# Patient Record
Sex: Male | Born: 1980
Health system: Southern US, Community
[De-identification: ages and names within clinical notes are randomized; demographics above are authoritative.]

## PROBLEM LIST (undated history)

## (undated) DIAGNOSIS — G4733 Obstructive sleep apnea (adult) (pediatric): Secondary | ICD-10-CM

## (undated) DIAGNOSIS — T7840XA Allergy, unspecified, initial encounter: Secondary | ICD-10-CM

## (undated) DIAGNOSIS — K219 Gastro-esophageal reflux disease without esophagitis: Secondary | ICD-10-CM

## (undated) DIAGNOSIS — E785 Hyperlipidemia, unspecified: Secondary | ICD-10-CM

## (undated) DIAGNOSIS — G4730 Sleep apnea, unspecified: Secondary | ICD-10-CM

## (undated) DIAGNOSIS — J189 Pneumonia, unspecified organism: Secondary | ICD-10-CM

## (undated) DIAGNOSIS — F419 Anxiety disorder, unspecified: Secondary | ICD-10-CM

## (undated) HISTORY — PX: INGUINAL HERNIA REPAIR: SUR1180

## (undated) HISTORY — DX: Pneumonia, unspecified organism: J18.9

## (undated) HISTORY — DX: Sleep apnea, unspecified: G47.30

## (undated) HISTORY — DX: Allergy, unspecified, initial encounter: T78.40XA

## (undated) HISTORY — PX: APPENDECTOMY: SHX54

## (undated) HISTORY — DX: Gastro-esophageal reflux disease without esophagitis: K21.9

## (undated) HISTORY — DX: Obstructive sleep apnea (adult) (pediatric): G47.33

## (undated) HISTORY — PX: COLONOSCOPY: SHX174

---

## 2002-04-28 ENCOUNTER — Emergency Department (HOSPITAL_COMMUNITY): Admission: EM | Admit: 2002-04-28 | Discharge: 2002-04-28 | Payer: Self-pay | Admitting: Emergency Medicine

## 2006-01-22 ENCOUNTER — Emergency Department (HOSPITAL_COMMUNITY): Admission: EM | Admit: 2006-01-22 | Discharge: 2006-01-23 | Payer: Self-pay | Admitting: Emergency Medicine

## 2006-02-11 ENCOUNTER — Emergency Department (HOSPITAL_COMMUNITY): Admission: EM | Admit: 2006-02-11 | Discharge: 2006-02-12 | Payer: Self-pay | Admitting: Emergency Medicine

## 2009-06-06 ENCOUNTER — Ambulatory Visit: Payer: Self-pay | Admitting: Family

## 2009-06-06 DIAGNOSIS — F411 Generalized anxiety disorder: Secondary | ICD-10-CM

## 2009-06-06 DIAGNOSIS — F419 Anxiety disorder, unspecified: Secondary | ICD-10-CM | POA: Insufficient documentation

## 2009-06-06 DIAGNOSIS — J309 Allergic rhinitis, unspecified: Secondary | ICD-10-CM | POA: Insufficient documentation

## 2009-06-06 DIAGNOSIS — K219 Gastro-esophageal reflux disease without esophagitis: Secondary | ICD-10-CM

## 2009-06-16 ENCOUNTER — Ambulatory Visit: Payer: Self-pay | Admitting: Family

## 2009-06-17 ENCOUNTER — Encounter: Payer: Self-pay | Admitting: Family

## 2009-06-17 ENCOUNTER — Telehealth: Payer: Self-pay | Admitting: Family

## 2009-06-17 LAB — CONVERTED CEMR LAB
BUN: 14 mg/dL (ref 6–23)
CO2: 23 meq/L (ref 19–32)
Chloride: 101 meq/L (ref 96–112)
Eosinophils Relative: 6 % — ABNORMAL HIGH (ref 0–5)
Glucose, Bld: 85 mg/dL (ref 70–99)
HCT: 45.7 % (ref 39.0–52.0)
Hemoglobin: 15.8 g/dL (ref 13.0–17.0)
Indirect Bilirubin: 0.5 mg/dL (ref 0.0–0.9)
LDL Cholesterol: 190 mg/dL — ABNORMAL HIGH (ref 0–99)
Lymphocytes Relative: 43 % (ref 12–46)
Lymphs Abs: 3.4 10*3/uL (ref 0.7–4.0)
Monocytes Absolute: 0.5 10*3/uL (ref 0.1–1.0)
Monocytes Relative: 6 % (ref 3–12)
Potassium: 4.5 meq/L (ref 3.5–5.3)
RBC: 5.12 M/uL (ref 4.22–5.81)
Total Bilirubin: 0.6 mg/dL (ref 0.3–1.2)
Total Protein: 7.6 g/dL (ref 6.0–8.3)
Triglycerides: 241 mg/dL — ABNORMAL HIGH (ref <150)
VLDL: 48 mg/dL — ABNORMAL HIGH (ref 0–40)
WBC: 8 10*3/uL (ref 4.0–10.5)

## 2009-06-18 ENCOUNTER — Ambulatory Visit: Payer: Self-pay | Admitting: Family

## 2009-06-18 DIAGNOSIS — R030 Elevated blood-pressure reading, without diagnosis of hypertension: Secondary | ICD-10-CM | POA: Insufficient documentation

## 2009-06-18 DIAGNOSIS — E782 Mixed hyperlipidemia: Secondary | ICD-10-CM | POA: Insufficient documentation

## 2009-06-18 DIAGNOSIS — E785 Hyperlipidemia, unspecified: Secondary | ICD-10-CM | POA: Insufficient documentation

## 2009-07-18 ENCOUNTER — Ambulatory Visit: Payer: Self-pay | Admitting: Family

## 2009-07-18 DIAGNOSIS — E663 Overweight: Secondary | ICD-10-CM | POA: Insufficient documentation

## 2009-08-25 ENCOUNTER — Telehealth: Payer: Self-pay | Admitting: Family

## 2009-09-05 ENCOUNTER — Telehealth (INDEPENDENT_AMBULATORY_CARE_PROVIDER_SITE_OTHER): Payer: Self-pay | Admitting: *Deleted

## 2009-09-22 ENCOUNTER — Ambulatory Visit: Payer: Self-pay | Admitting: Family

## 2009-09-22 LAB — CONVERTED CEMR LAB
Inflenza A Ag: NEGATIVE
Rapid Strep: NEGATIVE

## 2009-12-01 ENCOUNTER — Ambulatory Visit: Payer: Self-pay | Admitting: Family

## 2009-12-01 LAB — CONVERTED CEMR LAB
LDL Cholesterol: 199 mg/dL — ABNORMAL HIGH (ref 0–99)
Triglycerides: 226 mg/dL — ABNORMAL HIGH (ref <150)

## 2009-12-02 ENCOUNTER — Telehealth: Payer: Self-pay | Admitting: Family

## 2009-12-29 ENCOUNTER — Ambulatory Visit: Payer: Self-pay | Admitting: Family

## 2010-01-09 ENCOUNTER — Telehealth: Payer: Self-pay | Admitting: Family

## 2010-02-12 ENCOUNTER — Telehealth: Payer: Self-pay | Admitting: Family

## 2010-02-23 ENCOUNTER — Encounter: Payer: Self-pay | Admitting: Family

## 2010-03-16 ENCOUNTER — Ambulatory Visit: Payer: Self-pay | Admitting: Family

## 2010-03-16 LAB — CONVERTED CEMR LAB
ALT: 44 units/L (ref 0–53)
AST: 20 units/L (ref 0–37)
Albumin: 4.6 g/dL (ref 3.5–5.2)
Cholesterol: 232 mg/dL — ABNORMAL HIGH (ref 0–200)
HDL: 37 mg/dL — ABNORMAL LOW (ref >39)
Total Bilirubin: 0.9 mg/dL (ref 0.3–1.2)
Total CHOL/HDL Ratio: 6.3

## 2010-03-17 ENCOUNTER — Telehealth: Payer: Self-pay | Admitting: Family

## 2010-07-20 ENCOUNTER — Ambulatory Visit: Payer: Self-pay | Admitting: Family

## 2010-07-20 LAB — CONVERTED CEMR LAB
ALT: 43 units/L (ref 0–53)
AST: 24 units/L (ref 0–37)
Alkaline Phosphatase: 79 units/L (ref 39–117)
Bilirubin, Direct: 0.1 mg/dL (ref 0.0–0.3)
Cholesterol: 213 mg/dL — ABNORMAL HIGH (ref 0–200)
Indirect Bilirubin: 0.7 mg/dL (ref 0.0–0.9)

## 2010-07-21 ENCOUNTER — Telehealth: Payer: Self-pay | Admitting: Family

## 2010-08-05 ENCOUNTER — Ambulatory Visit: Payer: Self-pay | Admitting: Family

## 2010-08-05 ENCOUNTER — Encounter: Payer: Self-pay | Admitting: Internal Medicine

## 2010-08-05 ENCOUNTER — Encounter: Payer: Self-pay | Admitting: Family

## 2010-08-05 DIAGNOSIS — J069 Acute upper respiratory infection, unspecified: Secondary | ICD-10-CM | POA: Insufficient documentation

## 2010-09-15 NOTE — Assessment & Plan Note (Signed)
Summary: 1 month follow up / tf,cma--Rm 5   Vital Signs:  Patient profile:   30 year old male Height:      71 inches Weight:      225 pounds BMI:     31.49 Temp:     98.2 degrees F oral Pulse rate:   72 / minute Pulse rhythm:   regular Resp:     16 per minute BP sitting:   112 / 80  (right arm) Cuff size:   large  Vitals Entered By: Mervin Kung CMA Duncan Dull) (March 16, 2010 8:35 AM) CC: Room 5   1 month follow up.  Needs refill on PAxil. Is Patient Diabetic? No Pain Assessment Patient in pain? no        CC:  Room 5   1 month follow up.  Needs refill on PAxil.Marland Kitchen  History of Present Illness: Brandon Jensen is a 30 year old male who presents today for follow up.  1) Hyperlipidemia- Patient was intolerant to simvastatin due to myalgias, however he reports that he is feeling well on pravastatin. No myalgias.     2)Inguinal hernia- Notes that he had hernia surgery- about 6 weeks ago.  Feeling good- no longer having inguinal pain as prior to his surgery.  Notes that he has gained a few pounds back following his surgery.  3)Anxiety- reports that this is well controlled on Paxil.  No complaints.    4)HTN- has bee watching his diet.     Allergies: 1)  ! * Red Dye  Past History:  Past Medical History: Last updated: 06/06/2009 Allergic rhinitis GERD  Past Surgical History: Last updated: 06/06/2009 Denies surgical history  Family History: Last updated: 06/06/2009 Family History Hypertension (grandfather, paternal) Family History of Prostate CA 1st degree relative <50 (maternal GF)  Social History: Last updated: 06/06/2009 Occupation: English as a second language teacher Married 5 years 2 daughters 4,2   Risk Factors: Alcohol Use: 12 per month (06/06/2009) Caffeine Use: 4-5 beverages daily (06/06/2009) Exercise: no (06/06/2009)  Risk Factors: Smoking Status: quit (06/06/2009) Packs/Day: 1.0 (06/06/2009)  Review of Systems       see HPI  Physical Exam  General:   Well-developed,well-nourished,in no acute distress; alert,appropriate and cooperative throughout examination Lungs:  Normal respiratory effort, chest expands symmetrically. Lungs are clear to auscultation, no crackles or wheezes. Heart:  Normal rate and regular rhythm. S1 and S2 normal without gallop, murmur, click, rub or other extra sounds. Psych:  Cognition and judgment appear intact. Alert and cooperative with normal attention span and concentration. No apparent delusions, illusions, hallucinations   Impression & Recommendations:  Problem # 1:  MIXED HYPERLIPIDEMIA (ICD-272.2) Assessment Comment Only Will check LFT's and follow up FLP His updated medication list for this problem includes:    Pravachol 20 Mg Tabs (Pravastatin sodium) ..... One tablet by mouth daily at bedtime  Orders: T-Lipid Profile [phone]) T-Hepatic Function [phone])  Problem # 2:  ELEVATED BP READING WITHOUT DX HYPERTENSION (ICD-796.2) Assessment: Improved BP is stable, continue diet monitoring.  BP today: 112/80 Prior BP: 118/86 (12/29/2009)  Labs Reviewed: Creat: 1.15 (06/17/2009) Chol: 277 (12/01/2009)   HDL: 33 (12/01/2009)   LDL: 199 (12/01/2009)   TG: 226 (12/01/2009)  Instructed in low sodium diet (DASH Handout) and behavior modification.    Problem # 3:  ANXIETY (ICD-300.00) Assessment: Improved Stable, continue paxil. His updated medication list for this problem includes:    Paxil 40 Mg Tabs (Paroxetine hcl) .Marland Kitchen... Take 1 tablet by mouth once a day  Complete  Medication List: 1)  Paxil 40 Mg Tabs (Paroxetine hcl) .... Take 1 tablet by mouth once a day 2)  Pravachol 20 Mg Tabs (Pravastatin sodium) .... One tablet by mouth daily at bedtime  Patient Instructions: 1)  Please follow up in 4 months- Sooner if problems or concerns. Prescriptions: PRAVACHOL 20 MG TABS (PRAVASTATIN SODIUM) one tablet by mouth daily at bedtime  #30 x 3   Entered and Authorized by:   Lemont Fillers FNP    Signed by:   Lemont Fillers FNP on 03/16/2010   Method used:   [facility] to        [facility] [phone]* (retail)       [address]       [place], [place]  [address]       Ph: [phone]       Fax: [phone]   RxID:   [id number] PAXIL 40 MG TABS (PAROXETINE HCL) Take 1 tablet by mouth once a day  #30 Tablet x 3   Entered and Authorized by:   Lemont Fillers FNP   Signed by:   Lemont Fillers FNP on 03/16/2010   Method used:   [facility]        [facility] [phone]* (retail)       [address]       [place], [place]  [address]       Ph: [phone]       Fax: [phone]   RxID:   [id number]   Current Allergies (reviewed today): ! * RED DYE

## 2010-09-15 NOTE — Progress Notes (Signed)
Summary: start pravastatin  Phone Note Outgoing Call   Call placed by: Lemont Fillers FNP,  February 12, 2010 9:20 AM Call placed to: Patient Summary of Call: Pls call patient and see how he is feeling off of the simvastatin.  If he is feeling better, I would like him to try a different medicine for his high cholesterol- pravastatin.  Fewer people have problems with muscle pain on this medication.  He should follow up 1 month after starting medication. Initial call taken by: Lemont Fillers FNP,  February 12, 2010 9:22 AM  Follow-up for Phone Call        Pt advised of Sevanna Ballengee's instructions and is agreeable. F/u scheduled for 03/16/10 @ 8:15 am. Pt will be fasting.  Appt reminder mailed to pt.  Nicki Guadalajara Fergerson CMA  February 12, 2010 10:09 AM     New/Updated Medications: PRAVACHOL 20 MG TABS (PRAVASTATIN SODIUM) one tablet by mouth daily at bedtime Prescriptions: PRAVACHOL 20 MG TABS (PRAVASTATIN SODIUM) one tablet by mouth daily at bedtime  #30 x 1   Entered and Authorized by:   Lemont Fillers FNP   Signed by:   Lemont Fillers FNP on 02/12/2010   Method used:   Electronically to        [facility] [phone]* (retail)       [address]       [place], [place]  [address]       Ph: [phone]       Fax: [phone]   RxID:   [id number]

## 2010-09-15 NOTE — Medication Information (Signed)
Summary: Nonadherence with Pravastatin Sodium/[employer]  Nonadherence with Pravastatin Sodium/[employer]   Imported By: Lanelle Bal 03/27/2010 13:44:34  _____________________________________________________________________  External Attachment:    Type:   Image     Comment:   External Document

## 2010-09-15 NOTE — Progress Notes (Signed)
Summary: cholesterol result  Phone Note Outgoing Call   Summary of Call: Pls call pt and let him know that his cholesterol is still very high.  Total cholesterol  277 (should be under 200),  LDL is 199- should be under 130 for him, triglycerides also elevated.  At this point I think that he would benefit from starting a statin to decrease his risk for heart disease.  Patient should still plan to follow up in 1 month.   Initial call taken by: Lemont Fillers FNP,  December 02, 2009 10:25 AM  Follow-up for Phone Call        Left message on machine to return call.  Mervin Kung CMA  December 02, 2009 10:45 AM   Left message on pt's cell voicemail advising pt. of results and instructions per Layton Tappan O'Sullivan,NP.  Ok to leave results on voicemail per pt.  Advised pt. to call the office to reschedule his 1 month follow up as it was cancelled.  Nicki Guadalajara Fergerson CMA  December 02, 2009 1:08 PM     New/Updated Medications: SIMVASTATIN 20 MG TABS (SIMVASTATIN) one tablet by mouth daily at bedtime.  Prescriptions: SIMVASTATIN 20 MG TABS (SIMVASTATIN) one tablet by mouth daily at bedtime.  #30 x 1   Entered and Authorized by:   Lemont Fillers FNP   Signed by:   Lemont Fillers FNP on 12/02/2009   Method used:   Electronically to        [facility] [phone]* (retail)       [address]       [place], [place]  [address]       Ph: [phone]       Fax: [phone]   RxID:   [id number]

## 2010-09-15 NOTE — Progress Notes (Signed)
Summary: Blood Pressure Readings  Phone Note Call from Patient Call back at Home Phone [phone]   Caller: Patient Summary of Call: patient called and left voice message to report his Blood pressure readings.  Call was returned to patient he states his blood pressure readings were: 1/13  131/81    1/15 130/70  1/16 144/82     1/17 123/76 1/18 133/84     1/20 125/79  1/21 116/76 Initial call taken by: Glendell Docker CMA,  September 05, 2009 5:47 PM  Follow-up for Phone Call        Please call patient and let him know that I would like him to continue working on low sodium diet and exercise/weight loss.  He should continue to monitor his BP and call us if his blood pressure is greater than 150/90.  We will hold off on medicines for now.   Follow-up by: Lemont Fillers FNP,  September 07, 2009 8:52 AM  Additional Follow-up for Phone Call Additional follow up Details #1::        Spoke with pt informed him of [name] recommendations, pt agreed .Kandice Hams  September 08, 2009 11:48 AM  Additional Follow-up by: Kandice Hams,  September 08, 2009 11:48 AM

## 2010-09-15 NOTE — Progress Notes (Signed)
  Phone Note Outgoing Call   Summary of Call: Called patient to review BP's- patient notes that he received a BP monitor for christmas but has not been checking.  He was instructed to check BP daily for 1 week and call with results for further instructions Initial call taken by: Lemont Fillers FNP,  August 25, 2009 1:25 PM

## 2010-09-15 NOTE — Progress Notes (Signed)
Summary: lab results  Phone Note Outgoing Call   Summary of Call: Please call patient and let him know that his triglycerides are still high.  I would like for him to add fish oil, and avoid concentrated sweets, white fluffy carbs.  Should also work on diet, exercise and weight loss.  Continue Pravachol. Come fasting to his appointment in June. Initial call taken by: Lemont Fillers FNP,  July 21, 2010 9:09 AM  Follow-up for Phone Call        Pt notified and voices understanding. Nicki Guadalajara Fergerson CMA Duncan Dull)  July 21, 2010 10:39 AM     New/Updated Medications: FISH OIL 1000 MG CAPS (OMEGA-3 FATTY ACIDS) 2 caps by mouth two times a day

## 2010-09-15 NOTE — Progress Notes (Signed)
Summary: Simvastatin side effect?  Phone Note Call from Patient Call back at [phone] cell #--can leave message on machine   Caller: Patient Call For: Lemont Fillers FNP Summary of Call: Pt states he has been having muscle cramps in his forearms since taking the Simvastatin.  Advised pt. to stop med until further notice.  Please advise.  Mervin Kung CMA  Jan 09, 2010 10:34 AM   Follow-up for Phone Call        Late entry:  called patient on 5/28 to follow up on Myalgias.  He told me that his myalgias were better following discontinuation of simvastin.  Recommended that he stay off for now.  We can consider a different statin in a few weeks. Follow-up by: Lemont Fillers FNP,  Jan 13, 2010 8:10 AM

## 2010-09-15 NOTE — Assessment & Plan Note (Signed)
Summary: congestion/Fever/hea   Vital Signs:  Patient profile:   30 year old male Weight:      223 pounds Temp:     99.0 degrees F oral BP sitting:   130 / 80  (left arm)  Vitals Entered By: Brandon Jensen (September 22, 2009 2:37 PM) CC: fatigue and fever up to 101 x2-3 days chest congestion    CC:  fatigue and fever up to 101 x2-3 days chest congestion .  History of Present Illness: Brandon Jensen is a 30 year old male who presents today with c/o fever up to 101 yesterday,  notes + fatigue, denies sore throat, + hoarseness, denies N/V/D. Symptoms started 2-3 days ago. Notes that his daughter recently had strep, the other daughter with viral illness/cough, vomitting.  Notes that he has taken ibuprofen for fever.  Notes + nasal discharge is green/yellow in nature. Denies cough.   Allergies: 1)  ! * Red Dye  Physical Exam  General:  Well-developed,well-nourished,in no acute distress; alert,appropriate and cooperative throughout examination Head:  Normocephalic and atraumatic without obvious abnormalities. No apparent alopecia or balding. Ears:  External ear exam shows no significant lesions or deformities.  Otoscopic examination reveals clear canals, tympanic membranes are intact bilaterally without bulging, retraction, inflammation or discharge. Hearing is grossly normal bilaterally. Lungs:  Normal respiratory effort, chest expands symmetrically. Lungs are clear to auscultation, no crackles or wheezes. Heart:  Normal rate and regular rhythm. S1 and S2 normal without gallop, murmur, click, rub or other extra sounds. Cervical Nodes:  No lymphadenopathy noted   Impression & Recommendations:  Problem # 1:  SINUSITIS (ICD-473.9) Rapid strep negative, influenza negative.  I suspect acute viral illness initially, however patient is now developing copious green/yellow discharge.  Will treat with amoxicillin for sinusitus. His updated medication list for this problem includes:    Amoxicillin  500 Mg Cap (Amoxicillin) .Marland Kitchen... Take 1 capsule by mouth three times a day x 10 days  Orders: Rapid Strep ([address]) Flu A+B ([address])  Complete Medication List: 1)  Paxil 40 Mg Tabs (Paroxetine hcl) .... Take 1 tablet by mouth once a day 2)  Amoxicillin 500 Mg Cap (Amoxicillin) .... Take 1 capsule by mouth three times a day x 10 days  Patient Instructions: 1)  Drink plenty of fluids, get lots of rest.  2)  Take 400-600mg  of Ibuprofen (Advil, Motrin) with food every 4-6 hours as needed for relief of pain or comfort of fever. 3)  Call if your symptoms are not resolved in 1 week. Prescriptions: AMOXICILLIN 500 MG CAP (AMOXICILLIN) Take 1 capsule by mouth three times a day X 10 days  #30 x 0   Entered and Authorized by:   Lemont Fillers FNP   Signed by:   Lemont Fillers FNP on 09/22/2009   Method used:   Electronically to        [facility] [phone]* (retail)       [address]       [place], [place]  [address]       Ph: [phone]       Fax: [phone]   RxID:   [id number]   Laboratory Results    Other Tests  Rapid Strep: negative Influenza A: negative Influenza B: negative  Kit Test Internal QC: Positive   (Normal Range: Negative)

## 2010-09-15 NOTE — Progress Notes (Signed)
Summary: lab results  Phone Note Outgoing Call   Summary of Call: Please call patient and let him know that his cholesterol is improving, but not quite at goal.  Triglycerides are high.  He should try to avoid concentrated sweets (sodas, juice candy etc), white/fluffy carbs (potatoes/pasta/white bread) and instead eat smaller portions of whole grains (whole grain bread/pasta etc.)  I would like to have him increase his pravastatin to 40mg  (he can take 2 tabs of the 20mg  pills until they run out) I will send a new rx for 40 to his pharmacy.   Patient to return in 3 months (fasting) please. Initial call taken by: Lemont Fillers FNP,  March 17, 2010 9:13 AM  Follow-up for Phone Call        Pt advised per [facility] instructions and voices understanding.  Pt has follow up in December and will be fasting.  Nicki Guadalajara Fergerson CMA (AAMA)  March 17, 2010 1:27 PM     New/Updated Medications: PRAVACHOL 40 MG TABS (PRAVASTATIN SODIUM) one tablet by mouth daily Prescriptions: PRAVACHOL 40 MG TABS (PRAVASTATIN SODIUM) one tablet by mouth daily  #30 x 2   Entered and Authorized by:   Lemont Fillers FNP   Signed by:   Lemont Fillers FNP on 03/17/2010   Method used:   Electronically to        [facility] [phone]* (retail)       [address]       [place], [place]  [address]       Ph: [phone]       Fax: [phone]   RxID:   [id number]

## 2010-09-15 NOTE — Assessment & Plan Note (Signed)
Summary: 1 month follow up/mhf   Vital Signs:  Patient profile:   30 year old male Height:      71 inches Weight:      221 pounds BMI:     30.93 Temp:     98.1 degrees F oral Pulse rate:   66 / minute Pulse rhythm:   regular Resp:     16 per minute BP sitting:   118 / 86  (right arm) Cuff size:   regular  Vitals Entered By: Mervin Kung CMA (Dec 29, 2009 8:04 AM) CC: room 4  1 month f/u.  Pt has not started HCTZ and Simvastatin yet; wants to discuss. Is Patient Diabetic? No   CC:  room 4  1 month f/u.  Pt has not started HCTZ and Simvastatin yet; wants to discuss.Marland Kitchen  History of Present Illness: Brandon Jensen is a 30 year old male who presents today for follow up.  1) HTN-  last visit he was noted to have elevated blood pressure 140/90 and was given rx for HCTZ.  He has not started medication yet as he was watching his blood pressure at home and notes that his BP has been averaging 130's/77.  2)Hyperlipidemia- Has not started simvastin.  Has questions regarding initiation/side effects of this medication.    3) Inguinal Hernia- Saw Dr. Bertram Savin.  Noted that she was unable to palpate hernia, but clinically believes that patient has hernia based on symptoms.  She rx'd light duty at work for 1 month.  Pt notes that this has not improved his symptoms and in fact they are becoming worse.  Has intermittent pain shooting into his right groin.  He is likely going to to opt for laparoscopic RIH repair with Dr. Freida Busman.    Allergies: 1)  ! * Red Dye  Physical Exam  General:  Well-developed,well-nourished,in no acute distress; alert,appropriate and cooperative throughout examination Lungs:  Normal respiratory effort, chest expands symmetrically. Lungs are clear to auscultation, no crackles or wheezes. Heart:  Normal rate and regular rhythm. S1 and S2 normal without gallop, murmur, click, rub or other extra sounds.   Impression & Recommendations:  Problem # 1:  ELEVATED BP READING  WITHOUT DX HYPERTENSION (ICD-796.2) Assessment Improved BP looks better today, DBP readings at home have been lower (70's).  Reasonable to hold off on initiation of HCTZ.  Pt to monitor BP at home.   The following medications were removed from the medication list:    Hydrochlorothiazide 25 Mg Tabs (Hydrochlorothiazide) ..... One tab by mouth daily  BP today: 118/86 Prior BP: 140/90 (12/01/2009)  Labs Reviewed: Creat: 1.15 (06/17/2009) Chol: 277 (12/01/2009)   HDL: 33 (12/01/2009)   LDL: 199 (12/01/2009)   TG: 226 (12/01/2009)  Instructed in low sodium diet (DASH Handout) and behavior modification.    Problem # 2:  MIXED HYPERLIPIDEMIA (ICD-272.2) Assessment: Unchanged LDL 199, total cholesterol 277.  Numbers are essentially unchanged over last 6 months despite dietary modifications and 12 pounds of weight loss.  Discussed common side effects of simvastatin with patient including mylagias (pt instructed to d/c and call if myalgias occur).  He is agreeable to initiate therapy.  F/u in 1 month for FLP and LFT's.  Pt counselled on diet excercise and continued weight loss.   His updated medication list for this problem includes:    Simvastatin 20 Mg Tabs (Simvastatin) ..... One tablet by mouth daily at bedtime.  Future Orders: T-Lipid Profile [id number] ... 01/29/2010 T-Hepatic Function [phone]) ... 01/29/2010  Labs Reviewed: SGOT: 24 (06/17/2009)   SGPT: 47 (06/17/2009)   HDL:33 (12/01/2009), 34 (06/17/2009)  LDL:199 (12/01/2009), 190 (06/17/2009)  Chol:277 (12/01/2009), 272 (06/17/2009)  Trig:226 (12/01/2009), 241 (06/17/2009)  Problem # 3:  INGUINAL HERNIA, RIGHT (ICD-550.90) Assessment: New F/u with surgery for repair.  Complete Medication List: 1)  Paxil 40 Mg Tabs (Paroxetine hcl) .... Take 1 tablet by mouth once a day 2)  Simvastatin 20 Mg Tabs (Simvastatin) .... One tablet by mouth daily at bedtime.  Patient Instructions: 1)  Please return in 1 month fasting for a lab  visit. 2)  Follow up in 3 months for an appointment.  We will repeat your blood pressure at that time and see how you are doing with your diet and weight loss.    Current Allergies (reviewed today): ! * RED DYE

## 2010-09-15 NOTE — Assessment & Plan Note (Signed)
Summary: 1 MONTH FOLLOW UP/MHF--Rm 5   Vital Signs:  Patient profile:   30 year old male Height:      71 inches Weight:      232 pounds BMI:     32.47 Temp:     97.9 degrees F oral Pulse rate:   78 / minute Pulse rhythm:   regular Resp:     16 per minute BP sitting:   126 / 88  (right arm) Cuff size:   large  Vitals Entered By: Mervin Kung CMA Duncan Dull) (July 20, 2010 8:10 AM) CC: Pt here for 1 month f/u, Hypertension Management Is Patient Diabetic? No Pain Assessment Patient in pain? no      Comments Pt agrees all med doses and directions are correct. Nicki Guadalajara Fergerson CMA Duncan Dull)  July 20, 2010 8:14 AM    Primary Care Provider:  Lemont Fillers FNP  CC:  Pt here for 1 month f/u and Hypertension Management.  History of Present Illness: Brandon Jensen is a 30 year old male who presents today for routine follow up.  1) HTN-Denies, chest pain, swelling or headache.  2) Lipids- Cut soda out completely.  Denies myalgia  3) Anxiety- reports that this is well controlled on paxil.   Hypertension History:      He denies headache, chest pain, and peripheral edema.        Positive major cardiovascular risk factors include hyperlipidemia.  Negative major cardiovascular risk factors include male age less than 44 years old and non-tobacco-user status.     Allergies: 1)  ! * Red Dye  Past History:  Past Medical History: Last updated: 06/06/2009 Allergic rhinitis GERD  Past Surgical History: Last updated: 06/06/2009 Denies surgical history  Physical Exam  General:  Well-developed,well-nourished,in no acute distress; alert,appropriate and cooperative throughout examination Head:  Normocephalic and atraumatic without obvious abnormalities. No apparent alopecia or balding. Lungs:  Normal respiratory effort, chest expands symmetrically. Lungs are clear to auscultation, no crackles or wheezes. Heart:  Normal rate and regular rhythm. S1 and S2 normal without  gallop, murmur, click, rub or other extra sounds. Extremities:  No clubbing, cyanosis, edema, or deformity noted with normal full range of motion of all joints.     Impression & Recommendations:  Problem # 1:  ELEVATED BP READING WITHOUT DX HYPERTENSION (ICD-796.2) Assessment Unchanged BP stable, not currently on meds.  Contiue to monitor.  BP today: 126/88 Prior BP: 112/80 (03/16/2010)  10 Yr Risk Heart Disease: 4 %  Labs Reviewed: Creat: 1.15 (06/17/2009) Chol: 232 (03/16/2010)   HDL: 37 (03/16/2010)   LDL: 135 (03/16/2010)   TG: 299 (03/16/2010)  Instructed in low sodium diet (DASH Handout) and behavior modification.    Problem # 2:  ANXIETY (ICD-300.00) Assessment: Unchanged Anxiety is stable, continue paxil. His updated medication list for this problem includes:    Paxil 40 Mg Tabs (Paroxetine hcl) .Marland Kitchen... Take 1 tablet by mouth once a day  Problem # 3:  MIXED HYPERLIPIDEMIA (ICD-272.2) Assessment: Comment Only Last visit pravastatin was increased from 20mg  to 40mg .  Check FLP and LFT's today. His updated medication list for this problem includes:    Pravachol 40 Mg Tabs (Pravastatin sodium) ..... One tablet by mouth daily  Orders: TLB-Lipid Panel (80061-LIPID) TLB-Hepatic/Liver Function Pnl (80076-HEPATIC)  Complete Medication List: 1)  Paxil 40 Mg Tabs (Paroxetine hcl) .... Take 1 tablet by mouth once a day 2)  Pravachol 40 Mg Tabs (Pravastatin sodium) .... One tablet by mouth daily  Hypertension Assessment/Plan:      The patient's hypertensive risk group is category B: At least one risk factor (excluding diabetes) with no target organ damage.  His calculated 10 year risk of coronary heart disease is 4 %.  Today's blood pressure is 126/88.  His blood pressure goal is < 140/90.  Patient Instructions: 1)  Please work hard on diet, exercise and weight loss. 2)  Complete lab work on the first floor. 3)  Follow up in 6 months. Prescriptions: PAXIL 40 MG TABS  (PAROXETINE HCL) Take 1 tablet by mouth once a day  #30 Tablet x 5   Entered and Authorized by:   Lemont Fillers FNP   Signed by:   Lemont Fillers FNP on 07/20/2010   Method used:   Electronically to        [facility] [phone]* (retail)       [address]       [place], [place]  [address]       Ph: [phone]       Fax: [phone]   RxID:   [id number]    Orders Added: 1)  TLB-Lipid Panel [80061-LIPID] 2)  TLB-Hepatic/Liver Function Pnl [80076-HEPATIC] 3)  Est. Patient Level III [[address]]    Current Allergies (reviewed today): ! * RED DYE

## 2010-09-15 NOTE — Assessment & Plan Note (Signed)
Summary: 6 month follow up/mhf   Vital Signs:  Patient profile:   30 year old male Height:      71 inches Weight:      221.01 pounds BMI:     30.94 Temp:     98.2 degrees F oral Pulse rate:   72 / minute Pulse rhythm:   regular Resp:     16 per minute BP sitting:   140 / 90  (right arm) Cuff size:   regular  Vitals Entered By: Brandon Jensen CMA (December 01, 2009 8:07 AM) CC: room 4  Six month follow up.   Pt states he has been having an intermittent throbbing pain in his lower abdomen x 1 month. Is Patient Diabetic? No   CC:  room 4  Six month follow up.   Pt states he has been having an intermittent throbbing pain in his lower abdomen x 1 month..  History of Present Illness: Brandon Jensen is a 30 year old male who presents today for follow up.  He notes that he has had intermittent left lower abdominal pain x 1 month. 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 Pain is worse with lifting,  walking.  Pain is better when he pushes on his left lower abdomen.  He has not tried any OTC meds.    BP-  notes that his readings at home 125-130/90.  Today is higher which patient attributes to being nervous about his abdominal pain.    Allergies: 1)  ! * Red Dye  Physical Exam  General:  Well-developed,well-nourished,in no acute distress; alert,appropriate and cooperative throughout examination Genitalia:  GU exam performed with chaperone-circumcised male, no penile lesions.  Exam was discontinued for evaluation of hernia as patient developed erection during exam.  Psych:  Mildly nervous male in NAD.     Impression & Recommendations:  Problem # 1:  ELEVATED BP READING WITHOUT DX HYPERTENSION (ICD-796.2) Assessment Deteriorated Will add HCTZ.  Plan to repeat BP and have pt return in 1 month for BMET and BP check.   BP today: 140/90 Prior BP: 130/80 (09/22/2009)  Labs Reviewed: Creat: 1.15 (06/17/2009) Chol: 272 (06/17/2009)   HDL: 34 (06/17/2009)   LDL: 190 (06/17/2009)   TG: 241  (06/17/2009)  Instructed in low sodium diet (DASH Handout) and behavior modification.    His updated medication list for this problem includes:    Hydrochlorothiazide 25 Mg Tabs (Hydrochlorothiazide) ..... One tab by mouth daily  Problem # 2:  MIXED HYPERLIPIDEMIA (ICD-272.2) Assessment: Comment Only  Patient was noted to have high cholesterol in the fall, he opted for diet/exercise.  Will plan to repeat cholesterol today as patient is fasting.    Orders: T-Lipid Profile ([address])  Problem # 3:  ABDOMINAL PAIN, LOWER (ICD-789.09)  history was consistent with [facility] ([facility]), exam was limited.  Will refer to Surgery for further evaluation.    Orders: Surgical Referral (Surgery)  Complete Medication List: 1)  Paxil 40 Mg Tabs (Paroxetine hcl) .... Take 1 tablet by mouth once a day 2)  Hydrochlorothiazide 25 Mg Tabs (Hydrochlorothiazide) .... One tab by mouth daily  Patient Instructions: 1)  Good job with the weight loss, keep up the good work! 2)  Please follow up in 1 month. 3)  Continue with a low sodium diet. 4)  You will be contacted about your referral to surgery. 5)  Go to ER if you develop severe abdominal pain, nausea, vomitting. Prescriptions: HYDROCHLOROTHIAZIDE 25 MG TABS (HYDROCHLOROTHIAZIDE) one tab by mouth daily  #  30 x 1   Entered and Authorized by:   Brandon Fillers FNP   Signed by:   Brandon Fillers FNP on 12/01/2009   Method used:   Electronically to        [facility] [phone]* (retail)       [address]       [place], [place]  [address]       Ph: [phone]       Fax: [phone]   RxID:   [id number]   Current Allergies (reviewed today): ! * RED DYE

## 2010-09-15 NOTE — Letter (Signed)
Summary: Out of Work  Adult nurse at [employer]. [address]   [place], [place] [address]   Phone: [phone]  Fax: [phone]    September 22, 2009   Employee:  DEVANSH RIESE    To Whom It May Concern:   For Medical reasons, please excuse the above named employee from work for the following dates:  Start:   09/22/09  End:   09/24/09  If you need additional information, please feel free to contact our office.         Sincerely,    Lemont Fillers FNP

## 2010-09-17 NOTE — Letter (Signed)
Summary: Out of Work  Adult nurse at [employer]. [address]   [place], [place] [address]   Phone: [phone]  Fax: [phone]      August 05, 2010     Employee:  CORRAN LALONE    To Whom It May Concern:   For Medical reasons, please excuse the above named employee from work for the following dates:  Start:   08/06/2010  End:   08/07/2010  He may resume his normal work schedule on Monday 08/10/2010.If you need additional information, please feel free to contact our office.           Sincerely,    Glendell Docker, CMA (AAMA) [name], FNP [facility]

## 2010-09-17 NOTE — Assessment & Plan Note (Signed)
Summary: HEAD AND CHEST CONGESTION/MHF   Vital Signs:  Patient profile:   30 year old male Height:      71 inches Weight:      233 pounds BMI:     32.61 O2 Sat:      97 % on Room air Temp:     99.3 degrees F oral Pulse rate:   120 / minute Resp:     18 per minute BP sitting:   126 / 80  (right arm) Cuff size:   large  Vitals Entered By: Glendell Docker CMA (August 05, 2010 2:15 PM)  O2 Flow:  Room air CC: Head Congestion Is Patient Diabetic? No Pain Assessment Patient in pain? no      Comments c/o head congestion, nasal congestion and drainage in color  for the past 2 days, hot and cold chills, feverish   Primary Care [place]:  Lemont Fillers FNP  CC:  Head Congestion.  History of Present Illness: Symptoms started 2 days ago. Woke up last night with chills.  Nasal congestion- purulent.  + cough, on occasion with purulent phlegm.  + Fever- subjective at home.  He took ibuprofen a few hours ago. No recent sick contacts.  Denies sore throat or ear pain.  Has been drinking tea.  "probably not drinking enough water."  Did not have a flu shot this year.   Preventive Screening-Counseling & Management  Alcohol-Tobacco     Smoking Status: quit  Allergies: 1)  ! * Red Dye  Physical Exam  General:  Well-developed,well-nourished,in no acute distress; alert,appropriate and cooperative throughout examination Head:  Normocephalic and atraumatic without obvious abnormalities. No apparent alopecia or balding. Eyes:  PERRLA, sclera clear Ears:  External ear exam shows no significant lesions or deformities.  Otoscopic examination reveals clear canals, tympanic membranes are intact bilaterally without bulging, retraction, inflammation or discharge. Hearing is grossly normal bilaterally. Mouth:  Oral mucosa and oropharynx without lesions or exudates.  Teeth in good repair. Neck:  No deformities, masses, or tenderness noted. Lungs:  Normal respiratory effort, chest expands  symmetrically. Lungs are clear to auscultation, no crackles or wheezes. Heart:  Normal rate and regular rhythm. S1 and S2 normal without gallop, murmur, click, rub or other extra sounds.   Impression & Recommendations:  Problem # 1:  URI (ICD-465.9) Assessment New Suspect viral etiology at this point.  Rapid flu negative.  Recommended supportive care, pt to call back if symtpoms worsen, or if no improvment in 48-72 hours.  Complete Medication List: 1)  Paxil 40 Mg Tabs (Paroxetine hcl) .... Take 1 tablet by mouth once a day 2)  Pravachol 40 Mg Tabs (Pravastatin sodium) .... One tablet by mouth daily 3)  Fish Oil 1000 Mg Caps (Omega-3 fatty acids) .... 2 caps by mouth two times a day  Other Orders: Flu A+B (87400) EKG w/ Interpretation (93000)  Patient Instructions: 1)  Drink plenty of fluids. 2)  Take 400-600mg  of Ibuprofen (Advil, Motrin) with food every 4-6 hours as needed for relief of pain or comfort of fever. 3)  Call if symptoms worsen, or if they are not improved in 48-72 hours.   Orders Added: 1)  Flu A+B [87400] 2)  EKG w/ Interpretation [93000] 3)  Est. Patient Level III [04540]     Current Allergies (reviewed today): ! * RED DYE   Laboratory Results    Other Tests  Influenza A: negative Influenza B: negative

## 2010-09-23 ENCOUNTER — Emergency Department (INDEPENDENT_AMBULATORY_CARE_PROVIDER_SITE_OTHER): Payer: Managed Care, Other (non HMO)

## 2010-09-23 ENCOUNTER — Emergency Department (HOSPITAL_BASED_OUTPATIENT_CLINIC_OR_DEPARTMENT_OTHER)
Admission: EM | Admit: 2010-09-23 | Discharge: 2010-09-23 | Disposition: A | Discharge status: Home / Self Care | Payer: Managed Care, Other (non HMO) | Attending: Emergency Medicine | Admitting: Emergency Medicine

## 2010-09-23 DIAGNOSIS — M25519 Pain in unspecified shoulder: Secondary | ICD-10-CM | POA: Insufficient documentation

## 2010-09-23 DIAGNOSIS — R51 Headache: Secondary | ICD-10-CM

## 2010-09-23 DIAGNOSIS — Y92410 Unspecified street and highway as the place of occurrence of the external cause: Secondary | ICD-10-CM | POA: Insufficient documentation

## 2010-09-23 DIAGNOSIS — R079 Chest pain, unspecified: Secondary | ICD-10-CM | POA: Insufficient documentation

## 2010-09-23 DIAGNOSIS — R040 Epistaxis: Secondary | ICD-10-CM

## 2010-12-10 ENCOUNTER — Ambulatory Visit (HOSPITAL_BASED_OUTPATIENT_CLINIC_OR_DEPARTMENT_OTHER): Payer: Managed Care, Other (non HMO) | Attending: Otolaryngology

## 2010-12-10 DIAGNOSIS — G4733 Obstructive sleep apnea (adult) (pediatric): Secondary | ICD-10-CM | POA: Insufficient documentation

## 2010-12-10 DIAGNOSIS — R259 Unspecified abnormal involuntary movements: Secondary | ICD-10-CM | POA: Insufficient documentation

## 2010-12-19 DIAGNOSIS — R259 Unspecified abnormal involuntary movements: Secondary | ICD-10-CM

## 2010-12-19 DIAGNOSIS — G4733 Obstructive sleep apnea (adult) (pediatric): Secondary | ICD-10-CM

## 2010-12-19 NOTE — Procedures (Signed)
NAME:  Brandon Jensen, Brandon Jensen              ACCOUNT NO.:  [id number]  MEDICAL RECORD NO.:  [id number]          PATIENT TYPE:  OUT  LOCATION:  SLEEP CENTER                 FACILITY:  [facility]  PHYSICIAN:  Momen Ham D. Maple Hudson, MD, FCCP, FACPDATE OF BIRTH:  15-May-1981  DATE OF STUDY:  12/10/2010                           NOCTURNAL POLYSOMNOGRAM  REFERRING PHYSICIAN:  DAVID L SHOEMAKER  INDICATION FOR STUDY:  Hypersomnia with sleep apnea.  EPWORTH SLEEPINESS SCORE:  10/24.  BMI 30.7.  Weight 220 pounds.  Height 71 inches.  Neck 16 inches.  MEDICATIONS:  Home medications are charted and reviewed.  SLEEP ARCHITECTURE:  Split study protocol.  During the diagnostic phase, total sleep time 126 minutes with sleep efficiency 78.8%.  Stage I was 7.1%, stage II 77.4%, stage III 15.5%, REM absent.  Sleep latency 29 minutes, awake after sleep onset 5 minutes, arousal index 39.  BEDTIME MEDICATION:  None.  RESPIRATORY DATA:  Split study protocol.  Apnea/hypopnea index (AHI) 32.9 per hour.  A total of 69 events was scored including 3 obstructive apneas, 1 central apnea, 65 hypopneas.  Events were mainly associated with supine sleep.  CPAP was titrated to 8 CWP, AHI 4.6 per hour.  He wore a medium ResMed Quattro Mirage full-face mask with heated humidifier.  OXYGEN DATA:  Before CPAP, snoring was moderate with oxygen desaturation to a nadir of 80% and the mean oxygen saturation on room air of 92.9% through the study.  With CPAP titration, mean oxygen saturation held 93.8% on room air and snoring was prevented.  CARDIAC DATA:  Normal sinus rhythm.  MOVEMENT-PARASOMNIA:  Occasional limb jerks were noted, but with little effect on sleep.  Bathroom x2.  IMPRESSIONS-RECOMMENDATIONS: 1. Severe obstructive sleep apnea/hypopnea syndrome, AHI 32.9 per hour     with most events being hypopneas associated with supine sleep.     Moderate snoring with oxygen desaturation to a nadir of 80% on room     air. 2.  Successful CPAP titration to 8 CWP, AHI 4.6 per hour.  He wore a     medium ResMed Quattro Mirage full-face     mask with heated humidifier.  Snoring was prevented and oxygenation     normalized. 3. Limb jerks were noted, but not seen to affect sleep quality on the     study night.     Gene Glazebrook D. Maple Hudson, MD, [facility], FACP Diplomate, Biomedical engineer of Sleep Medicine Electronically Signed    CDY/MEDQ  D:  12/19/2010 09:31:04  T:  12/19/2010 22:46:02  Job:  [id number]

## 2010-12-21 ENCOUNTER — Encounter: Payer: Self-pay | Admitting: Family

## 2011-01-13 ENCOUNTER — Encounter: Payer: Self-pay | Admitting: Family

## 2011-01-18 ENCOUNTER — Encounter: Payer: Self-pay | Admitting: Family

## 2011-01-18 ENCOUNTER — Ambulatory Visit (INDEPENDENT_AMBULATORY_CARE_PROVIDER_SITE_OTHER): Payer: Managed Care, Other (non HMO) | Admitting: Family

## 2011-01-18 DIAGNOSIS — E782 Mixed hyperlipidemia: Secondary | ICD-10-CM

## 2011-01-18 DIAGNOSIS — I10 Essential (primary) hypertension: Secondary | ICD-10-CM

## 2011-01-18 DIAGNOSIS — R030 Elevated blood-pressure reading, without diagnosis of hypertension: Secondary | ICD-10-CM

## 2011-01-18 DIAGNOSIS — E785 Hyperlipidemia, unspecified: Secondary | ICD-10-CM

## 2011-01-18 DIAGNOSIS — F411 Generalized anxiety disorder: Secondary | ICD-10-CM

## 2011-01-18 DIAGNOSIS — E663 Overweight: Secondary | ICD-10-CM

## 2011-01-18 DIAGNOSIS — G4733 Obstructive sleep apnea (adult) (pediatric): Secondary | ICD-10-CM

## 2011-01-18 HISTORY — DX: Obstructive sleep apnea (adult) (pediatric): G47.33

## 2011-01-18 LAB — HEPATIC FUNCTION PANEL
Albumin: 4.9 g/dL (ref 3.5–5.2)
Alkaline Phosphatase: 73 U/L (ref 39–117)
Total Bilirubin: 0.8 mg/dL (ref 0.3–1.2)
Total Protein: 7.4 g/dL (ref 6.0–8.3)

## 2011-01-18 LAB — LIPID PANEL
Cholesterol: 194 mg/dL (ref 0–200)
HDL: 38 mg/dL — ABNORMAL LOW (ref >39)
LDL Cholesterol: 123 mg/dL — ABNORMAL HIGH (ref 0–99)
Total CHOL/HDL Ratio: 5.1 Ratio
Triglycerides: 167 mg/dL — ABNORMAL HIGH (ref <150)
VLDL: 33 mg/dL (ref 0–40)

## 2011-01-18 MED ORDER — PAROXETINE HCL 40 MG PO TABS: 40.0000 mg | ORAL_TABLET | ORAL | Status: DC

## 2011-01-18 MED ORDER — PRAVASTATIN SODIUM 40 MG PO TABS: 40.0000 mg | ORAL_TABLET | Freq: Every day | ORAL | Status: DC

## 2011-01-18 NOTE — Patient Instructions (Signed)
You will be contacted about your sleep apnea machine. Please follow up 1 month after you start the CPAP machine. Complete labs on the first floor.

## 2011-01-18 NOTE — Assessment & Plan Note (Signed)
Stable off of BP meds.
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
## 2011-01-18 NOTE — Assessment & Plan Note (Signed)
Pt has lost 3 pounds since last visit.  He has been exercising more.  I commended him for this.
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
## 2011-01-18 NOTE — Assessment & Plan Note (Signed)
Check FLP and LFT's today.

## 2011-01-18 NOTE — Assessment & Plan Note (Signed)
Well controlled, continue paroxetine.

## 2011-01-19 LAB — BASIC METABOLIC PANEL WITH GFR
BUN: 12 mg/dL (ref 6–23)
Calcium: 9.7 mg/dL (ref 8.4–10.5)
Creat: 1.12 mg/dL (ref 0.50–1.35)
GFR, Est African American: >60 mL/min (ref >60)
GFR, Est Non African American: >60 mL/min (ref >60)

## 2011-01-21 ENCOUNTER — Encounter: Payer: Self-pay | Admitting: Family

## 2011-02-03 ENCOUNTER — Telehealth: Payer: Self-pay | Admitting: Family

## 2011-02-03 DIAGNOSIS — G4733 Obstructive sleep apnea (adult) (pediatric): Secondary | ICD-10-CM

## 2011-02-03 NOTE — Telephone Encounter (Signed)
Left message for patient to return my call re: sleep study. When patient calls back please ask him if CPAP has been arranged, if not I will arrange as his sleep study shows sever sleep apnea.

## 2011-02-09 NOTE — Assessment & Plan Note (Signed)
Received sleep study results- severe obstructive sleep apnea/hypoapnea syndrome.  Recommeded CPAP at setting of 8 mediium Res Med Quattro Mirage full face mask.

## 2011-03-16 ENCOUNTER — Telehealth: Payer: Self-pay | Admitting: *Deleted

## 2011-03-16 NOTE — Telephone Encounter (Signed)
Pt left voice message requesting a return call re: sleep study questions. Left message on machine for pt to return my call.

## 2011-03-18 NOTE — Telephone Encounter (Signed)
Referral notes that it was faxed to [facility] on 02/09/11. Spoke to [employer] at [phone] re: referral status and she reports that they never received our referral. Reports problems with their fax for some time but has since been corrected. Will refax referral to [phone]. Pt has been notified and advised to call me if he has not been contacted by them by Thursday of next week.

## 2011-03-18 NOTE — Telephone Encounter (Signed)
Left message on machine to return my call. 

## 2011-03-19 NOTE — Telephone Encounter (Signed)
Referral re-faxed to [phone]. Spoke to [employer] and requested verification of receipt of fax. He states he will call me when they have received it.

## 2011-03-22 NOTE — Telephone Encounter (Signed)
Spoke with Elita Quick, she states they do not accept [employer] and we will need to contact [employer]. Spoke to [place] at [place] [phone]) and verified they accept [employer]. Referral, sleep study and demographics faxed to [phone] Attn: Customer Service. Left detailed message on pt's machine of referral status and to call if any questions.

## 2011-03-24 NOTE — Telephone Encounter (Signed)
Spoke to pt and verified that Brandon Jensen has contacted him re: cpap supplies.

## 2011-05-14 ENCOUNTER — Ambulatory Visit (INDEPENDENT_AMBULATORY_CARE_PROVIDER_SITE_OTHER): Payer: Managed Care, Other (non HMO) | Admitting: Family

## 2011-05-14 ENCOUNTER — Encounter: Payer: Self-pay | Admitting: Family

## 2011-05-14 DIAGNOSIS — L918 Other hypertrophic disorders of the skin: Secondary | ICD-10-CM

## 2011-05-14 DIAGNOSIS — E663 Overweight: Secondary | ICD-10-CM

## 2011-05-14 DIAGNOSIS — L919 Hypertrophic disorder of the skin, unspecified: Secondary | ICD-10-CM

## 2011-05-14 DIAGNOSIS — F17200 Nicotine dependence, unspecified, uncomplicated: Secondary | ICD-10-CM

## 2011-05-14 DIAGNOSIS — E782 Mixed hyperlipidemia: Secondary | ICD-10-CM

## 2011-05-14 DIAGNOSIS — F411 Generalized anxiety disorder: Secondary | ICD-10-CM

## 2011-05-14 DIAGNOSIS — G4733 Obstructive sleep apnea (adult) (pediatric): Secondary | ICD-10-CM

## 2011-05-14 DIAGNOSIS — R030 Elevated blood-pressure reading, without diagnosis of hypertension: Secondary | ICD-10-CM

## 2011-05-14 DIAGNOSIS — Z720 Tobacco use: Secondary | ICD-10-CM | POA: Insufficient documentation

## 2011-05-14 NOTE — Assessment & Plan Note (Signed)
He was counseled on diet, exercise and weight loss.

## 2011-05-14 NOTE — Assessment & Plan Note (Signed)
He was counseled on risks associated with chewing tobacco use and advised to quit.  3-5 minutes were spent counseling pt on cessation.

## 2011-05-14 NOTE — Assessment & Plan Note (Signed)
This is stable, continue paxil.

## 2011-05-14 NOTE — Assessment & Plan Note (Signed)
Less drowsy during the day. Continue CPAP.

## 2011-05-14 NOTE — Progress Notes (Signed)
  Subjective:    Patient ID: Brandon Jensen, male    DOB: 03-Jul-1981, 30 y.o.   MRN: [id number]  HPI  1) HTN- this has been diet controlled.    2) Overweight- he has gained 7 pounds since his last visit. Not exercising lately.    3) Anxiety- on paroxetine- reports that his anxiety is well controlled.    4) Smokeless tobacco use- quit smoking 3 years ago.    5) OSA- using CPAP- No longer dosing off.    5) Hyperlipidemia- he continues the pravastin. Denies myaligias.  6) Skin tags- he notes that he has several skin tags around his neck which bother him.  He also has two lesions in the right groin area that he would like to have evaluated.     Review of Systems See HPI  Past Medical History  Diagnosis Date  . Allergy   . GERD (gastroesophageal reflux disease)   . Obstructive sleep apnea 01/18/2011    History   Social History  . Marital Status: Married    Spouse Name: [name]    Number of Children: 2  . 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 Smoking status: Never Smoker   . Smokeless tobacco: Current User   Comment: Uses dip  . Alcohol Use: Not on file  . Drug Use: Not on file  . Sexually Active: Not on file   Other Topics Concern  . Not on file   Social History Narrative  . No narrative on file    No past surgical history on file.  Family History  Problem Relation Age of Onset  . Cancer Maternal Grandfather     prostate  . Hypertension Paternal Grandfather     No Known Allergies  Current Outpatient Prescriptions on File Prior to Visit  Medication Sig Dispense Refill  . PARoxetine (PAXIL) 40 MG tablet Take 1 tablet (40 mg total) by mouth every morning.  30 tablet  5  . pravastatin (PRAVACHOL) 40 MG tablet Take 1 tablet (40 mg total) by mouth daily.  30 tablet  5    BP 118/86  Pulse 78  Temp(Src) 97.8 F (36.6 C) (Oral)  Resp 16  Ht 5\' 11"  (1.803 m)  Wt 236 lb (107.049 kg)  BMI 32.92  kg/m2       Objective:   Physical Exam  Constitutional: He appears well-developed and well-nourished.  HENT:  Head: Normocephalic and atraumatic.  Cardiovascular: Normal rate and regular rhythm.   No murmur heard. Pulmonary/Chest: Effort normal and breath sounds normal. No respiratory distress. He has no wheezes. He has no rales. He exhibits no tenderness.  Genitourinary:          Two pink tag like lesions noted on right scrotum.   Musculoskeletal: He exhibits no edema.  Skin: He is not diaphoretic.          Assessment & Plan:

## 2011-05-14 NOTE — Assessment & Plan Note (Signed)
Last LDL was at goal, continue pravastatin.  

## 2011-05-14 NOTE — Patient Instructions (Signed)
Please follow up in 3 months. Work hard on diet, exercise and weight loss.  Stop using smokeless tobacco.

## 2011-05-14 NOTE — Assessment & Plan Note (Signed)
4 skin tags were frozen today.  Pt tolerated procedure well.  Also, referred pt to dermatology for further evaluation of lesions on the right scrotum.  These may be warts, but I am not certain.  He wishes to have these removed.

## 2011-05-14 NOTE — Assessment & Plan Note (Signed)
BP Readings from Last 3 Encounters:  05/14/11 118/86  01/18/11 116/82  08/05/10 126/80  SBP <90, reinforced the importance of low sodium diet, exercise and weight loss.

## 2011-05-27 ENCOUNTER — Encounter (HOSPITAL_BASED_OUTPATIENT_CLINIC_OR_DEPARTMENT_OTHER): Payer: Self-pay

## 2011-05-27 ENCOUNTER — Emergency Department (HOSPITAL_BASED_OUTPATIENT_CLINIC_OR_DEPARTMENT_OTHER)
Admission: EM | Admit: 2011-05-27 | Discharge: 2011-05-27 | Disposition: A | Discharge status: Home / Self Care | Payer: Managed Care, Other (non HMO) | Attending: Emergency Medicine | Admitting: Emergency Medicine

## 2011-05-27 DIAGNOSIS — E785 Hyperlipidemia, unspecified: Secondary | ICD-10-CM | POA: Insufficient documentation

## 2011-05-27 DIAGNOSIS — L509 Urticaria, unspecified: Secondary | ICD-10-CM | POA: Insufficient documentation

## 2011-05-27 DIAGNOSIS — K219 Gastro-esophageal reflux disease without esophagitis: Secondary | ICD-10-CM | POA: Insufficient documentation

## 2011-05-27 HISTORY — DX: Anxiety disorder, unspecified: F41.9

## 2011-05-27 HISTORY — DX: Hyperlipidemia, unspecified: E78.5

## 2011-05-27 MED ORDER — DEXAMETHASONE SODIUM PHOSPHATE 10 MG/ML IJ SOLN
10.0000 mg | Freq: Once | INTRAMUSCULAR | Status: AC
  Administered 2011-05-27: 10 mg via INTRAVENOUS
  Filled 2011-05-27: qty 1

## 2011-05-27 MED ORDER — DIPHENHYDRAMINE HCL 50 MG/ML IJ SOLN
25.0000 mg | Freq: Once | INTRAMUSCULAR | Status: AC
  Administered 2011-05-27: 25 mg via INTRAVENOUS
  Filled 2011-05-27: qty 1

## 2011-05-27 MED ORDER — FAMOTIDINE 20 MG PO TABS: 20.0000 mg | ORAL_TABLET | Freq: Two times a day (BID) | ORAL | Status: DC

## 2011-05-27 MED ORDER — FAMOTIDINE IN NACL 20-0.9 MG/50ML-% IV SOLN
20.0000 mg | Freq: Once | INTRAVENOUS | Status: AC
  Administered 2011-05-27: 20 mg via INTRAVENOUS
  Filled 2011-05-27: qty 50

## 2011-05-27 MED ORDER — DIPHENHYDRAMINE HCL 25 MG PO TABS: 25.0000 mg | ORAL_TABLET | Freq: Four times a day (QID) | ORAL | Status: AC

## 2011-05-27 MED ORDER — PREDNISONE 50 MG PO TABS: 50.0000 mg | ORAL_TABLET | Freq: Every day | ORAL | Status: AC

## 2011-05-27 MED ORDER — SODIUM CHLORIDE 0.9 % IV SOLN: Freq: Once | INTRAVENOUS | Status: DC

## 2011-05-27 NOTE — ED Provider Notes (Signed)
History     CSN: [id number] Arrival date & time: 05/27/2011  8:03 AM  Chief Complaint  Patient presents with  . Urticaria    Patient is a 30 y.o. male presenting with urticaria.  Urticaria   30 year old male presents with hives. Patient states he has had intermittent hives over the past one week. He states that the hives all over his body and associated with itching. He does not have a known source of this allergic reaction. He has had similar reactions in the past to read by into the things that he had no exposures to speak that he knows of. There are no new soaps, lotions, detergents, cologne. He was seen in urgent care and given a shot of steroids which he states did not help. He's been taking Benadryl 3 times a day minimal relief of the itching. He denies fever, chills, sick contacts. He denies recent travel. He is not having headache, dizziness, abdominal pain, nausea, vomiting. He denies tongue swelling. He states that he his lips "feel funny". Denies shortness of breath  Past Medical History  Diagnosis Date  . Allergy   . GERD (gastroesophageal reflux disease)   . Obstructive sleep apnea 01/18/2011  . Anxiety   . Hyperlipemia     Past Surgical History  Procedure Date  . Hernia repair     Family History  Problem Relation Age of Onset  . Cancer Maternal Grandfather     prostate  . Hypertension Paternal Grandfather     History  Substance Use Topics  . Smoking status: Never Smoker   . Smokeless tobacco: Current User   Comment: Uses dip  . Alcohol Use: Yes     occasional      Review of Systems Negative except as noted in history of present illness Allergies  No known food allergy and Red dye  Home Medications   Current Outpatient Rx  Name Route Sig Dispense Refill  . PAROXETINE HCL 40 MG PO TABS Oral Take 40 mg by mouth every evening.      Marland Kitchen PRAVASTATIN SODIUM 40 MG PO TABS Oral Take 40 mg by mouth every evening.      Marland Kitchen DIPHENHYDRAMINE HCL 25 MG PO TABS Oral  Take 1 tablet (25 mg total) by mouth every 6 (six) hours. 24 tablet 0  . FAMOTIDINE 20 MG PO TABS Oral Take 1 tablet (20 mg total) by mouth 2 (two) times daily. 6 tablet 0  . PREDNISONE 50 MG PO TABS Oral Take 1 tablet (50 mg total) by mouth daily. 3 tablet 0    BP 118/59  Pulse 91  Temp(Src) 98.6 F (37 C) (Oral)  Resp 16  Ht 5\' 11"  (1.803 m)  Wt 237 lb (107.502 kg)  BMI 33.05 kg/m2  SpO2 99%  Physical Exam  Nursing note and vitals reviewed. Constitutional: He is oriented to person, place, and time. He appears well-developed and well-nourished. No distress.  HENT:  Head: Atraumatic.  Mouth/Throat: Oropharynx is clear and moist.       There is no tongue swelling. Posterior oropharynx unremarkable. There is no swelling of his lips.  Eyes: Conjunctivae and EOM are normal. Pupils are equal, round, and reactive to light.  Neck: Neck supple.  Cardiovascular: Normal rate, regular rhythm, normal heart sounds and intact distal pulses.  Exam reveals no gallop and no friction rub.   No murmur heard. Pulmonary/Chest: Effort normal. No respiratory distress. He has no wheezes. He has no rales.  Abdominal: Soft. Bowel sounds are  normal. There is no tenderness. There is no rebound and no guarding.  Musculoskeletal: Normal range of motion. He exhibits no edema and no tenderness.  Neurological: He is alert and oriented to person, place, and time.  Skin: Skin is warm and dry. Rash noted.       Diffuse hives over her trunk, upper extremities, bilateral lower extremities  Psychiatric: He has a normal mood and affect.    ED Course  Procedures (including critical care time)  Labs Reviewed - No data to display No results found.   1. Hives     MDM  Patient presents with urticaria. This is likely related to an allergic reaction. He can identify a source. Urticaria of his upper extremities is resolved after IV fluids, Decadron, Benadryl, Pepcid. Patient feeling much better and has no itching at  this time. Patient advised to keep a diary of exposures. Also advised to take Pepcid, Benadryl, steroids around-the-clock for 3 days. Follow up with his primary care provider in 2 days for recheck. Given precautions for return. Feels comfortable with discharge.  Stefano Gaul, MD        Forbes Cellar, MD 05/27/11 [phone]

## 2011-05-27 NOTE — ED Notes (Signed)
Generalized hives that started on Friday.

## 2011-05-27 NOTE — ED Notes (Signed)
Reassessment after IV medications.  Hives improving but still present.  Pt states itching has improved.

## 2011-05-27 NOTE — ED Notes (Signed)
MD at bedside. 

## 2011-05-27 NOTE — ED Notes (Signed)
Secondary assessment- Hives noted on extremities, trunk and face.  Pt reports onset Friday and he was seen at Urgent Care and given IM Prednisone.  He reports hives have not improved after IM medication and PO Benadryl.  States his lips feel "tingley".  Denies SHOB and lungs CTA bilaterally.

## 2011-05-28 ENCOUNTER — Telehealth: Payer: Self-pay | Admitting: *Deleted

## 2011-05-28 NOTE — Telephone Encounter (Signed)
Pt called stating he was seen in the ED yesterday for hives (head to toe). Reports that he has had them x 1 week and they got worse. Pt is taking Benadryl every 6 hours, famotidine 40mg  1 twice a day and Prednisone 50mg  1 tablet daily since yesterday. Reports that his hives have returned on his shoulder and knuckles and he is itching all over. States that he took an extra Benadryl recently because he was breaking out again. Advised pt per [name], NP that he should continue what he is currently doing, nothing new to add. Advised pt to be seen in the ER If he develops swelling of lips or tongue or shortness of breath. Scheduled pt f/u with Melissa on Monday at 4pm.

## 2011-05-31 ENCOUNTER — Encounter: Payer: Self-pay | Admitting: Family

## 2011-05-31 ENCOUNTER — Ambulatory Visit (INDEPENDENT_AMBULATORY_CARE_PROVIDER_SITE_OTHER): Payer: Managed Care, Other (non HMO) | Admitting: Family

## 2011-05-31 DIAGNOSIS — L509 Urticaria, unspecified: Secondary | ICD-10-CM | POA: Insufficient documentation

## 2011-05-31 MED ORDER — CETIRIZINE HCL 10 MG PO TABS: 10.0000 mg | ORAL_TABLET | Freq: Every day | ORAL | Status: DC

## 2011-05-31 MED ORDER — FAMOTIDINE 20 MG PO TABS: 20.0000 mg | ORAL_TABLET | Freq: Two times a day (BID) | ORAL | Status: DC

## 2011-05-31 NOTE — Progress Notes (Signed)
Subjective:    Patient ID: Brandon Jensen, male    DOB: [birth year], 30 y.o.   MRN: [id number]  HPI  Mr.  Bann is a 30 yr old male who presents today with chief complaint of Urticaria. Pt reports 10 day hx of intermittent hives.  Went to the urgent care- initially and was given a "cortisone shot."  Went to the ED on Thursday 10/11, and was given benadryl, pepcid and steroids. Hives initially resolved.  Later that night hives returned.  He is taking benadryl TID which is helping,  but it is making him drowsy at work.  Denies associated shortness of breath.  Had subjective lip swelling the night that he wen to the ED but he tells me that the ED staff told him that his lips did not look swollen.  No wheezing.  He is not using any new products.  Not eating any new foods.  No changes to his environment. Reports previous history of hives about 10 yrs ago which resolved with a steroid injection.  He reports that he had some hives about 4 hours ago, but they are resolved at present.    Review of Systems See HPI  Past Medical History  Diagnosis Date  . Allergy   . GERD (gastroesophageal reflux disease)   . Obstructive sleep apnea 01/18/2011  . Anxiety   . Hyperlipemia     History   Social History  . Marital Status: Married    Spouse Name: [name]    Number of Children: 2  . 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 Smoking status: Never Smoker   . Smokeless tobacco: Current User   Comment: Uses dip  . Alcohol Use: Yes     occasional  . Drug Use: No  . Sexually Active: Not on file   Other Topics Concern  . Not on file   Social History Narrative  . No narrative on file    Past Surgical History  Procedure Date  . Hernia repair     Family History  Problem Relation Age of Onset  . Cancer Maternal Grandfather     prostate  . Hypertension Paternal Grandfather     Allergies  Allergen Reactions  . No Known Food Allergy     . Red Dye Hives    Current Outpatient Prescriptions on File Prior to Visit  Medication Sig Dispense Refill  . diphenhydrAMINE (BENADRYL) 25 MG tablet Take 1 tablet (25 mg total) by mouth every 6 (six) hours.  24 tablet  0  . PARoxetine (PAXIL) 40 MG tablet Take 40 mg by mouth every evening.        . pravastatin (PRAVACHOL) 40 MG tablet Take 40 mg by mouth every evening.        . predniSONE (DELTASONE) 50 MG tablet Take 1 tablet (50 mg total) by mouth daily.  3 tablet  0    BP 112/84  Pulse 78  Temp(Src) 98.6 F (37 C) (Oral)  Resp 16  Wt 234 lb 1.3 oz (106.178 kg)       Objective:   Physical Exam  Constitutional: He appears well-developed and well-nourished. No distress.  HENT:  Head: Normocephalic and atraumatic.       No tongue or lip swelling noted.    Neck: Normal range of motion. Neck supple.  Cardiovascular: Normal rate and regular rhythm.   No murmur heard. Pulmonary/Chest: Effort normal and breath sounds  normal. No respiratory distress. He has no wheezes. He has no rales. He exhibits no tenderness.  Skin: Skin is warm and dry. No erythema.       No rash or urticaria is noted.   Psychiatric: He has a normal mood and affect. His behavior is normal. Judgment and thought content normal.          Assessment & Plan:

## 2011-05-31 NOTE — Patient Instructions (Signed)
Go directly to the ER if you develop tongue, lip swelling or shortness of breath.  Go to ER if you develop severe hives not improved with benadryl. Do not drive while sleepy.  You will be contacted about your referral to the allergist.  Follow up in 1 month.

## 2011-05-31 NOTE — Assessment & Plan Note (Signed)
Will check a food and environmental allergy panel.  Pt is instructed to complete the prednisone as directed, resume pepcid. Start zyrtec, take benadryl only as needed.  Refer to allergist.  Go directly to the ED if shortness of breath, tongue/lip swelling.

## 2011-06-01 LAB — ~~LOC~~ ALLERGY PANEL
Allergen, Comm Silver Birch, t9: <0.1 kU/L (ref <0.35)
Allergen, D pternoyssinus,d7: 0.39 kU/L — ABNORMAL HIGH (ref <0.35)
Alternaria Alternata: <0.1 kU/L (ref <0.35)
Aspergillus fumigatus, IgG: <0.1 kU/L (ref <0.35)
Bermuda Grass: 5.07 kU/L — ABNORMAL HIGH (ref <0.35)
Box Elder IgE: 1.61 kU/L — ABNORMAL HIGH (ref <0.35)
Cat Dander: <0.1 kU/L (ref <0.35)
Cockroach: <0.1 kU/L (ref <0.35)
Common Ragweed: 4.68 kU/L — ABNORMAL HIGH (ref <0.35)
D. farinae: 0.22 kU/L (ref <0.35)
Mucor Racemosus: <0.1 kU/L (ref <0.35)
Pecan/Hickory Tree IgE: 0.12 kU/L (ref <0.35)
Penicillium Notatum: <0.1 kU/L (ref <0.35)
Sweet Gum: <0.1 kU/L (ref <0.35)

## 2011-06-05 LAB — IGG FOOD PANEL
Allergen, Milk, IgG: 29.7 ug/mL — ABNORMAL HIGH (ref <0.15)
Beef, IgG: 6.6 ug/mL — ABNORMAL HIGH (ref <2)
Corn, IgG: 0.77 ug/mL — ABNORMAL HIGH (ref <0.15)
Egg yolk, IgG: 4.6 ug/mL — ABNORMAL HIGH (ref <2)

## 2011-06-08 ENCOUNTER — Telehealth: Payer: Self-pay | Admitting: *Deleted

## 2011-06-08 NOTE — Telephone Encounter (Signed)
Message copied by Kathi Simpers on Tue Jun 08, 2011  4:17 PM ------      Message from: O'SULLIVAN, MELISSA      Created: Tue Jun 08, 2011  1:42 PM       Could you pls check status of his allergy panels from 10/15? thanks

## 2011-06-08 NOTE — Telephone Encounter (Signed)
Per Nida Boatman at [facility], allergy results are final on his end. He will fax results to [place].

## 2011-06-09 NOTE — Telephone Encounter (Signed)
Pt reports that the zyrtec is helping his symptoms.  Reviewed food allergy profile results.  Recommended that he avoid milk, corn, beef, egg, peanut and wheat until further recommendations from the allergist. He verbalizes understanding.

## 2011-06-09 NOTE — Telephone Encounter (Signed)
Allergy results received and forwarded to Provider for review.

## 2011-07-06 ENCOUNTER — Encounter: Payer: Self-pay | Admitting: Family

## 2011-08-06 ENCOUNTER — Encounter: Payer: Managed Care, Other (non HMO) | Admitting: Family

## 2011-08-20 ENCOUNTER — Ambulatory Visit (INDEPENDENT_AMBULATORY_CARE_PROVIDER_SITE_OTHER): Payer: Managed Care, Other (non HMO) | Admitting: Family

## 2011-08-20 ENCOUNTER — Encounter: Payer: Self-pay | Admitting: Family

## 2011-08-20 VITALS — BP 118/84 | HR 90 | Temp 98.1°F | Resp 16 | Ht 71.0 in | Wt 234.1 lb

## 2011-08-20 DIAGNOSIS — I10 Essential (primary) hypertension: Secondary | ICD-10-CM

## 2011-08-20 DIAGNOSIS — E785 Hyperlipidemia, unspecified: Secondary | ICD-10-CM

## 2011-08-20 DIAGNOSIS — Z23 Encounter for immunization: Secondary | ICD-10-CM

## 2011-08-20 DIAGNOSIS — Z0000 Encounter for general adult medical examination without abnormal findings: Secondary | ICD-10-CM | POA: Insufficient documentation

## 2011-08-20 LAB — LIPID PANEL
LDL Cholesterol: 143 mg/dL — ABNORMAL HIGH (ref 0–99)
VLDL: 49 mg/dL — ABNORMAL HIGH (ref 0–40)

## 2011-08-20 LAB — HEPATIC FUNCTION PANEL
ALT: 49 U/L (ref 0–53)
AST: 31 U/L (ref 0–37)
Bilirubin, Direct: 0.1 mg/dL (ref 0.0–0.3)
Indirect Bilirubin: 0.7 mg/dL (ref 0.0–0.9)

## 2011-08-20 NOTE — Progress Notes (Signed)
Subjective:    Patient ID: Brandon Jensen, male    DOB: [birth year], 31 y.o.   MRN: [id number]  HPI  Mr.  Brandon Jensen is a 31 yr old male who presents today for CPX.  He would like get a flu shot today.  He reports that he is exercising. He has been doing sit ups/push ups.  Gained weight over the holidays.  Review of Systems  Constitutional: Positive for unexpected weight change.  HENT: Negative for hearing loss.   Eyes: Negative for visual disturbance.  Respiratory: Negative for shortness of breath.   Cardiovascular: Negative for chest pain.  Gastrointestinal: Negative for nausea, vomiting and diarrhea.  Genitourinary: Negative for dysuria and frequency.  Musculoskeletal: Negative for myalgias and arthralgias.  Skin: Negative for rash.  Neurological: Negative for headaches.  Hematological: Negative for adenopathy.  Psychiatric/Behavioral:       Reports that anxiety is well controlled on paxil   Past Medical History  Diagnosis Date  . Allergy   . GERD (gastroesophageal reflux disease)   . Obstructive sleep apnea 01/18/2011  . Anxiety   . Hyperlipemia     History   Social History  . Marital Status: Married    Spouse Name: Brandon Jensen    Number of Children: 2  . 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 Smoking status: Never Smoker   . Smokeless tobacco: Current User   Comment: Uses dip  . Alcohol Use: Yes     occasional  . Drug Use: No  . Sexually Active: Not on file   Other Topics Concern  . Not on file   Social History Narrative  . No narrative on file    Past Surgical History  Procedure Date  . Hernia repair     Family History  Problem Relation Age of Onset  . Cancer Maternal Grandfather     prostate  . Hypertension Paternal Grandfather     Allergies  Allergen Reactions  . Beef-Derived Products   . Corn-Containing Products   . Eggs Or Egg-Derived Products   . Milk-Related Compounds   .  Peanut-Containing Drug Products   . Red Dye Hives  . Wheat     Current Outpatient Prescriptions on File Prior to Visit  Medication Sig Dispense Refill  . PARoxetine (PAXIL) 40 MG tablet Take 40 mg by mouth every evening.        . pravastatin (PRAVACHOL) 40 MG tablet Take 40 mg by mouth every evening.          BP 118/84  Pulse 90  Temp(Src) 98.1 F (36.7 C) (Oral)  Resp 16  Ht 5\' 11"  (1.803 m)  Wt 234 lb 1.3 oz (106.178 kg)  BMI 32.65 kg/m2       Objective:   Physical Exam  Constitutional: He appears well-developed and well-nourished. No distress.  HENT:  Head: Normocephalic and atraumatic.  Right Ear: Tympanic membrane and ear canal normal.  Left Ear: Tympanic membrane and ear canal normal.  Mouth/Throat: No oropharyngeal exudate.  Eyes: Conjunctivae are normal. Pupils are equal, round, and reactive to light. No scleral icterus.  Neck: Normal range of motion. Neck supple. No thyromegaly present.  Cardiovascular: Normal rate and regular rhythm.   No murmur heard. Pulmonary/Chest: Effort normal and breath sounds normal. No respiratory distress. He has no wheezes. He has no rales. He exhibits no tenderness.  Abdominal: Soft. Bowel sounds are normal. He exhibits no distension and  no mass. There is no tenderness. There is no rebound and no guarding.  Musculoskeletal: Normal range of motion. He exhibits no edema.          Assessment & Plan:

## 2011-08-20 NOTE — Patient Instructions (Signed)
Please complete your blood work prior to leaving.  Follow up in 6 months.  

## 2011-08-20 NOTE — Assessment & Plan Note (Signed)
Pt counseled on healthy eating and exercise. Fasting labs today. Flu shot today.

## 2011-08-21 LAB — BASIC METABOLIC PANEL
Chloride: 101 mEq/L (ref 96–112)
Potassium: 4.5 mEq/L (ref 3.5–5.3)
Sodium: 138 mEq/L (ref 135–145)

## 2011-08-22 ENCOUNTER — Telehealth: Payer: Self-pay | Admitting: Family

## 2011-08-22 DIAGNOSIS — E782 Mixed hyperlipidemia: Secondary | ICD-10-CM

## 2011-08-22 MED ORDER — ATORVASTATIN CALCIUM 40 MG PO TABS: 40.0000 mg | ORAL_TABLET | Freq: Every day | ORAL | Status: DC

## 2011-08-22 NOTE — Telephone Encounter (Signed)
Pls call pt and let him know that his cholesterol is higher this time 227 up from 194 last time.  I would like to switch him from pravastatin to generic lipitor which is stronger.  He should return to lab fasting in 6 weeks for FLP and LFT please diagnosis hyperlipidemia.

## 2011-08-23 NOTE — Telephone Encounter (Signed)
Left detailed message on voicemail and to call if any questions. Future lab order placed for the week of 10/04/11 and copy forwarded to the lab.

## 2011-08-23 NOTE — Telephone Encounter (Signed)
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

## 2011-09-11 ENCOUNTER — Other Ambulatory Visit: Payer: Self-pay | Admitting: Family

## 2011-09-13 ENCOUNTER — Other Ambulatory Visit: Payer: Self-pay | Admitting: *Deleted

## 2011-09-13 NOTE — Telephone Encounter (Signed)
Received message from pt requesting a return call re: meds. Attempted to reach pt and left message to return my call.

## 2011-09-15 MED ORDER — ALPRAZOLAM 0.5 MG PO TABS: ORAL_TABLET | ORAL | Status: DC

## 2011-09-15 NOTE — Telephone Encounter (Signed)
I think that this should help him- if necessary, he may take two tablets of the xanax prior to his flight if 1 is not effective.

## 2011-09-15 NOTE — Telephone Encounter (Signed)
Pt states he has been on Paxil for a long time. He has recently been promoted on his job. 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 Pt states he is terrified of flying and wants to know if there is something you could prescribe to help with his pre-flight anxiety. Please advise.

## 2011-09-15 NOTE — Telephone Encounter (Signed)
Notified pt and left refill on pharmacy voicemail.

## 2011-09-15 NOTE — Telephone Encounter (Signed)
Please call in alprazolam as pended below.

## 2011-09-15 NOTE — Telephone Encounter (Signed)
Notified pt. He states he was on this in college and it did not seem to help his anxiety. Wants to know if there is something else he can try? If not, he states he will try this. Please advise.

## 2011-11-28 ENCOUNTER — Other Ambulatory Visit: Payer: Self-pay | Admitting: Family

## 2011-11-29 ENCOUNTER — Ambulatory Visit (INDEPENDENT_AMBULATORY_CARE_PROVIDER_SITE_OTHER): Payer: Managed Care, Other (non HMO) | Admitting: Family

## 2011-11-29 ENCOUNTER — Encounter: Payer: Self-pay | Admitting: Family

## 2011-11-29 VITALS — BP 136/76 | HR 79 | Temp 98.2°F | Resp 16 | Ht 71.0 in | Wt 225.0 lb

## 2011-11-29 DIAGNOSIS — L02229 Furuncle of trunk, unspecified: Secondary | ICD-10-CM | POA: Insufficient documentation

## 2011-11-29 DIAGNOSIS — Z720 Tobacco use: Secondary | ICD-10-CM

## 2011-11-29 DIAGNOSIS — F17200 Nicotine dependence, unspecified, uncomplicated: Secondary | ICD-10-CM

## 2011-11-29 MED ORDER — VARENICLINE TARTRATE 0.5 MG X 11 & 1 MG X 42 PO MISC: ORAL | Status: AC

## 2011-11-29 MED ORDER — SULFAMETHOXAZOLE-TRIMETHOPRIM 800-160 MG PO TABS: 1.0000 | ORAL_TABLET | Freq: Two times a day (BID) | ORAL | Status: AC

## 2011-11-29 NOTE — Assessment & Plan Note (Signed)
Recommended warm compresses bid to promote drainage. Start bactrim.  Pt instructed to call if increased pain, redness, fever, or if symptoms do not improve.

## 2011-11-29 NOTE — Progress Notes (Signed)
Subjective:    Patient ID: Brandon Jensen, male    DOB: [birth year], 31 y.o.   MRN: [id number]  HPI  Brandon Jensen is a 31 yr old male who presents today with chief complaint of "ingrown hair."  Lesion is located on his lower abdomen on the "belt line." Reports associated tenderness, but no associated drainage or fever.    Tobacco abuse- pt reports that he was chewing tobacco, but his employer banned smokeless tobacco.  He started smoking again as he can go outside to smoke at his job.  Now smoking 3/4 pack a day.  Wants to quit.    Muscle pain- notes that he had some muscle pain in his lower abdomen/groin several weeks ago which has resolved.  It lasted about 1 week.   Review of Systems See HPI  Past Medical History  Diagnosis Date  . Allergy   . GERD (gastroesophageal reflux disease)   . Obstructive sleep apnea 01/18/2011  . Anxiety   . Hyperlipemia     History   Social History  . Marital Status: Married    Spouse Name: [name]    Number of Children: 2  . 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 Smoking status: Never Smoker   . Smokeless tobacco: Current User   Comment: Uses dip  . Alcohol Use: Yes     occasional  . Drug Use: No  . Sexually Active: Not on file   Other Topics Concern  . Not on file   Social History Narrative  . No narrative on file    Past Surgical History  Procedure Date  . Hernia repair     Family History  Problem Relation Age of Onset  . Cancer Maternal Grandfather     prostate  . Hypertension Paternal Grandfather     Allergies  Allergen Reactions  . Beef-Derived Products   . Corn-Containing Products   . Eggs Or Egg-Derived Products   . Milk-Related Compounds   . Peanut-Containing Drug Products   . Red Dye Hives  . Wheat     Current Outpatient Prescriptions on File Prior to Visit  Medication Sig Dispense Refill  . atorvastatin (LIPITOR) 40 MG tablet TAKE 1 TABLET (40 MG  TOTAL) BY MOUTH DAILY.  30 tablet  3  . PARoxetine (PAXIL) 40 MG tablet TAKE 1 TABLET (40 MG TOTAL) BY MOUTH EVERY MORNING.  30 tablet  6  . ALPRAZolam (XANAX) 0.5 MG tablet One tablet by mouth 30 minutes before flight for anxiety.  5 tablet  0  . DISCONTD: PARoxetine (PAXIL) 40 MG tablet Take 40 mg by mouth every evening.          BP 136/76  Pulse 79  Temp(Src) 98.2 F (36.8 C) (Oral)  Resp 16  Ht 5\' 11"  (1.803 m)  Wt 225 lb 0.6 oz (102.077 kg)  BMI 31.39 kg/m2  SpO2 98%       Objective:   Physical Exam  Constitutional: He appears well-developed and well-nourished. No distress.  Skin:       Pea sized boil noted beneath umbilicus on belt line.  Mild surrounding erythema is noted.    Psychiatric: His speech is normal and behavior is normal. Judgment and thought content normal. His mood appears anxious. His affect is not angry and not labile. Cognition and memory are normal. He does not exhibit a depressed mood.  Assessment & Plan:

## 2011-11-29 NOTE — Patient Instructions (Signed)
Abscess An abscess (boil or furuncle) is an infected area under your skin. This area is filled with yellowish white fluid (pus). HOME CARE   Only take medicine as told by your doctor.   Keep the skin clean around your abscess. Keep clothes that may touch the abscess clean.   Change any bandages (dressings) as told by your doctor.   Avoid direct skin contact with other people. The infection can spread by skin contact with others.   Practice good hygiene and do not share personal care items.   Do not share athletic equipment, towels, or whirlpools. Shower after every practice or work out session.   If a draining area cannot be covered:   Do not play sports.   Children should not go to daycare until the wound has healed or until fluid (drainage) stops coming out of the wound.   See your doctor for a follow-up visit as told.  GET HELP RIGHT AWAY IF:   There is more pain, puffiness (swelling), and redness in the wound site.   There is fluid or bleeding from the wound site.   You have muscle aches, chills, fever, or feel sick.   You or your child has a temperature by mouth above 102 F (38.9 C), not controlled by medicine.   Your baby is older than 3 months with a rectal temperature of 102 F (38.9 C) or higher.  MAKE SURE YOU:   Understand these instructions.   Will watch your condition.   Will get help right away if you are not doing well or get worse.  Document Released: 01/19/2008 Document Revised: 07/22/2011 Document Reviewed: 01/19/2008 Exit[employer] Patient Information 2012 [employer]. 

## 2011-11-29 NOTE — Assessment & Plan Note (Signed)
31 yr old male now back to smoking.  Wants to quit.  Will try chantix.  We discussed proper use of chantix as well as common side effects and rare side effect of suicide ideation.  Pt verbalizes understanding and wants to proceed.  Recommended that he complete the full 3 month course and contact us prior to rx running out so that we can send continuation month rx's.  >5 minutes spent counseling pt on tobacco cessation today.

## 2012-01-03 ENCOUNTER — Telehealth: Payer: Self-pay | Admitting: *Deleted

## 2012-01-03 NOTE — Telephone Encounter (Signed)
Spoke to [facility] and gave authorization to provide early refill. Notified pt.

## 2012-01-03 NOTE — Telephone Encounter (Signed)
Pt left voice message stating he has lost his bottle of Paxil. Refilled med on 12/18/11 per pharmacy and pt is requesting early refill. Please advise.

## 2012-01-03 NOTE — Telephone Encounter (Signed)
OK to refill early please.

## 2012-01-21 ENCOUNTER — Telehealth: Payer: Self-pay | Admitting: Family

## 2012-01-21 DIAGNOSIS — E782 Mixed hyperlipidemia: Secondary | ICD-10-CM

## 2012-01-21 NOTE — Telephone Encounter (Signed)
Pls call pt and remind him that he is due for flp and lft (hyperlipidemia).

## 2012-01-21 NOTE — Telephone Encounter (Signed)
Notified pt. He states he will return to the lab around 01/25/12. Future lab order re-entered given to the lab.

## 2012-01-25 LAB — HEPATIC FUNCTION PANEL
ALT: 46 U/L (ref 0–53)
AST: 26 U/L (ref 0–37)
Bilirubin, Direct: 0.1 mg/dL (ref 0.0–0.3)
Indirect Bilirubin: 0.5 mg/dL (ref 0.0–0.9)

## 2012-01-25 NOTE — Telephone Encounter (Signed)
Addended by: Mervin Kung A on: 01/25/2012 02:36 PM   Modules accepted: Orders

## 2012-01-25 NOTE — Telephone Encounter (Signed)
Pt presented to the lab, order released. 

## 2012-01-26 LAB — LIPID PANEL
LDL Cholesterol: 67 mg/dL (ref 0–99)
Triglycerides: 353 mg/dL — ABNORMAL HIGH (ref <150)
VLDL: 71 mg/dL — ABNORMAL HIGH (ref 0–40)

## 2012-02-01 ENCOUNTER — Encounter: Payer: Self-pay | Admitting: Family

## 2012-02-02 ENCOUNTER — Telehealth: Payer: Self-pay | Admitting: Family

## 2012-02-02 DIAGNOSIS — E782 Mixed hyperlipidemia: Secondary | ICD-10-CM

## 2012-02-02 MED ORDER — ATORVASTATIN CALCIUM 40 MG PO TABS: 40.0000 mg | ORAL_TABLET | Freq: Every day | ORAL | Status: DC

## 2012-02-02 NOTE — Telephone Encounter (Signed)
Left message for pt to return our call.  Please let him know that his total cholesterol is very improved- now down to 178. He should continue lipitor. Triglycerides remain high though.  I would like him to try to go back on the fish oil 2000mg  bid and avoid concentrated sweets.  Repeat FLP in 3 months.

## 2012-02-02 NOTE — Telephone Encounter (Signed)
Left message for pt to return my call on cell#.

## 2012-02-03 NOTE — Telephone Encounter (Signed)
He does not need lfts at that time.

## 2012-02-03 NOTE — Telephone Encounter (Signed)
Notified pt of results. He states that he was not fasting when he had lab work drawn. Advised pt that it was recommended that he start Fish oil as well based on the trend of his triglyceride level on past readings as well. Pt voices understanding. Future lab order placed for the week of 05/09/12. Copy given to the lab and mailed to patient. Do you want hepatic function panel too?

## 2012-02-04 NOTE — Addendum Note (Signed)
Addended by: Mervin Kung A on: 02/04/2012 08:46 AM   Modules accepted: Orders

## 2012-02-23 ENCOUNTER — Ambulatory Visit (INDEPENDENT_AMBULATORY_CARE_PROVIDER_SITE_OTHER): Payer: Managed Care, Other (non HMO) | Admitting: Family

## 2012-02-23 VITALS — BP 126/84 | HR 91 | Temp 100.0°F | Resp 16 | Ht 71.0 in | Wt 226.1 lb

## 2012-02-23 DIAGNOSIS — H6690 Otitis media, unspecified, unspecified ear: Secondary | ICD-10-CM

## 2012-02-23 DIAGNOSIS — J029 Acute pharyngitis, unspecified: Secondary | ICD-10-CM

## 2012-02-23 MED ORDER — AMOXICILLIN-POT CLAVULANATE 875-125 MG PO TABS: 1.0000 | ORAL_TABLET | Freq: Two times a day (BID) | ORAL | Status: AC

## 2012-02-23 NOTE — Patient Instructions (Addendum)
Otitis Media with Effusion  Otitis media with effusion is the presence of fluid in the middle ear. This is a common problem that often follows ear infections. It may be present for weeks or longer after the infection. Unlike an acute ear infection, otits media with effusion refers only to fluid behind the ear drum and not infection. Children with repeated ear and sinus infections and allergy problems are the most likely to get otitis media with effusion.  CAUSES   The most frequent cause of the fluid buildup is dysfunction of the eustacian tubes. These are the tubes that drain fluid in the ears to the throat.  SYMPTOMS    The main symptom of this condition is hearing loss. As a result, you or your child may:   Listen to the TV at a loud volume.   Not respond to questions.   Ask "what" often when spoken to.   There may be a sensation of fullness or pressure but usually not pain.  DIAGNOSIS    Your caregiver will diagnose this condition by examining you or your child's ears.   Your caregiver may test the pressure in you or your child's ear with a tympanometer.   A hearing test may be conducted if the problem persists.   A caregiver will want to re-evaluate the condition periodically to see if it improves.  TREATMENT    Treatment depends on the duration and the effects of the effusion.   Antibiotics, decongestants, nose drops, and cortisone-type drugs may not be helpful.   Children with persistent ear effusions may have delayed language. Children at risk for developmental delays in hearing, learning, and speech may require referral to a specialist earlier than children not at risk.   You or your child's caregiver may suggest a referral to an Ear, Nose, and Throat (ENT) surgeon for treatment. The following may help restore normal hearing:   Drainage of fluid.   Placement of ear tubes (tympanostomy tubes).   Removal of adenoids (adenoidectomy).  HOME CARE INSTRUCTIONS    Avoid second hand  smoke.   Infants who are breast fed are less likely to have this condition.   Avoid feeding infants while laying flat.   Avoid known environmental allergens.   Be sure to see a caregiver or an ENT specialist for follow up.   Avoid people who are sick.  SEEK MEDICAL CARE IF:    Hearing is not better in 3 months.   Hearing is worse.   Ear pain.   Drainage from the ear.   Dizziness.  Document Released: 09/09/2004 Document Revised: 07/22/2011 Document Reviewed: 12/23/2009  [employer] Patient Information 2012 [employer].

## 2012-02-24 DIAGNOSIS — H6690 Otitis media, unspecified, unspecified ear: Secondary | ICD-10-CM | POA: Insufficient documentation

## 2012-02-24 NOTE — Assessment & Plan Note (Signed)
Will rx with Augmentin. 

## 2012-02-24 NOTE — Progress Notes (Signed)
  Subjective:    Patient ID: Brandon Jensen, male    DOB: 1981-07-20, 31 y.o.   MRN: [id number]  HPI  [name] is a 31 yr old male who presents today with chief complaint of sore throat.  Symptoms started 2 days ago and are associated with fever, nasal congestion and right ear feeling "stopped up."Review of Systems    see HPI Objective:   Physical Exam  Constitutional: He is oriented to person, place, and time. He appears well-developed and well-nourished. No distress.  HENT:  Head: Normocephalic and atraumatic.  Right Ear: Tympanic membrane is erythematous. Tympanic membrane is not retracted and not bulging.  Left Ear: Tympanic membrane and ear canal normal.  Mouth/Throat: Posterior oropharyngeal erythema present. No oropharyngeal exudate or posterior oropharyngeal edema.  Neck: Normal range of motion. Neck supple.  Cardiovascular: Normal rate and regular rhythm.   No murmur heard. Pulmonary/Chest: Effort normal and breath sounds normal. No respiratory distress. He has no wheezes. He has no rales. He exhibits no tenderness.  Musculoskeletal: He exhibits no edema.  Lymphadenopathy:    He has no cervical adenopathy.  Neurological: He is alert and oriented to person, place, and time.  Skin: Skin is warm and dry.  Psychiatric: He has a normal mood and affect. His behavior is normal. Judgment and thought content normal.          Assessment & Plan:

## 2012-03-01 ENCOUNTER — Telehealth: Payer: Self-pay | Admitting: *Deleted

## 2012-03-01 MED ORDER — CEFUROXIME AXETIL 500 MG PO TABS: 500.0000 mg | ORAL_TABLET | Freq: Two times a day (BID) | ORAL | Status: DC

## 2012-03-01 NOTE — Telephone Encounter (Signed)
Notified pt. He states he still has sore throat and ear fullness. Does not feel symptoms are any better. Pt will start ceftin tomorrow and call Friday if no improvement.

## 2012-03-01 NOTE — Telephone Encounter (Signed)
He should stop augmentin, start cefuroxime, follow back up with me on Friday if symptoms are not improved.

## 2012-03-01 NOTE — Telephone Encounter (Signed)
Received message from pt stating he doesn't feel his ear infection has improved.  Thinks he still has a couple days left of Augmentin.  Left detailed message for pt to call and let us know if there has been any improvement of symptoms, is he still having same symptoms or are there any new symptoms.

## 2012-03-03 NOTE — Telephone Encounter (Signed)
Pt left message that he has taken Ceftin x 1 day, symptoms are not improved. Pt states symptoms have not worsened, feels the same. Denies cough, no difficulty swallowing liquids or solids. Advised pt that Primary PCP is out of the office today but covering Provider suggests that he give new antibiotic a few more days. If no improvement by Monday advised pt he will need to be re-evaluated in the office. If symptoms worsen over the weekend advised pt to be evaluated in urgent care. Pt voices understanding.

## 2012-03-06 ENCOUNTER — Ambulatory Visit (INDEPENDENT_AMBULATORY_CARE_PROVIDER_SITE_OTHER): Payer: Managed Care, Other (non HMO) | Admitting: Family

## 2012-03-06 ENCOUNTER — Telehealth: Payer: Self-pay | Admitting: *Deleted

## 2012-03-06 ENCOUNTER — Encounter: Payer: Self-pay | Admitting: Family

## 2012-03-06 VITALS — BP 112/84 | HR 75 | Temp 98.9°F | Resp 16 | Wt 223.1 lb

## 2012-03-06 DIAGNOSIS — H6690 Otitis media, unspecified, unspecified ear: Secondary | ICD-10-CM

## 2012-03-06 MED ORDER — LEVOFLOXACIN 500 MG PO TABS: 500.0000 mg | ORAL_TABLET | Freq: Every day | ORAL | Status: AC

## 2012-03-06 NOTE — Patient Instructions (Signed)
You will be contact about your referral to ENT.  Please let us know if you have not heard back within 3 days about your referral. Call if you develop fever, worsening pain or drainage from your right ear.

## 2012-03-06 NOTE — Progress Notes (Signed)
  Subjective:    Patient ID: Brandon Jensen, male    DOB: 06/28/1981, 31 y.o.   MRN: [id number]  HPI  [name] is a 31 yr old male who presents today for follow up.  He reports that his right ear pain has not improved despite 9 days of augmentin and 4 days of ceftin.  He denies fever.  Daughter has been sick as well with similar symptoms.  Review of Systems See HPI  Past Medical History  Diagnosis Date  . Allergy   . GERD (gastroesophageal reflux disease)   . Obstructive sleep apnea 01/18/2011  . Anxiety   . Hyperlipemia     History   Social History  . Marital Status: Married    Spouse Name: [name]    Number of Children: 2  . 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 Smoking status: Never Smoker   . Smokeless tobacco: Current User   Comment: Uses dip  . Alcohol Use: Yes     occasional  . Drug Use: No  . Sexually Active: Not on file   Other Topics Concern  . Not on file   Social History Narrative  . No narrative on file    Past Surgical History  Procedure Date  . Hernia repair     Family History  Problem Relation Age of Onset  . Cancer Maternal Grandfather     prostate  . Hypertension Paternal Grandfather     Allergies  Allergen Reactions  . Beef-Derived Products   . Corn-Containing Products   . Eggs Or Egg-Derived Products   . Milk-Related Compounds   . Peanut-Containing Drug Products   . Red Dye Hives  . Wheat     Current Outpatient Prescriptions on File Prior to Visit  Medication Sig Dispense Refill  . ALPRAZolam (XANAX) 0.5 MG tablet One tablet by mouth 30 minutes before flight for anxiety.  5 tablet  0  . atorvastatin (LIPITOR) 40 MG tablet Take 1 tablet (40 mg total) by mouth daily.  30 tablet  3  . fish oil-omega-3 fatty acids 1000 MG capsule Take 2 g by mouth 2 (two) times daily.      Marland Kitchen PARoxetine (PAXIL) 40 MG tablet TAKE 1 TABLET (40 MG TOTAL) BY MOUTH EVERY MORNING.  30 tablet  6     BP 112/84  Pulse 75  Temp 98.9 F (37.2 C) (Oral)  Resp 16  Wt 223 lb 1.4 oz (101.193 kg)  SpO2 99%        Objective:   Physical Exam  Constitutional: He appears well-developed and well-nourished. No distress.  HENT:  Head: Normocephalic and atraumatic.  Right Ear: Tympanic membrane and ear canal normal.  Mouth/Throat: No posterior oropharyngeal edema or posterior oropharyngeal erythema.       R TM is partially occluded by cerumen.  Area visualized appears erythematous.   Eyes: Pupils are equal, round, and reactive to light.  Neck:       Mild right sided cervical LAD  Cardiovascular: Normal rate and regular rhythm.   No murmur heard. Pulmonary/Chest: Effort normal and breath sounds normal. No respiratory distress. He has no wheezes. He has no rales. He exhibits no tenderness.  Musculoskeletal: He exhibits no edema.  Psychiatric: He has a normal mood and affect. His behavior is normal. Judgment and thought content normal.          Assessment & Plan:

## 2012-03-06 NOTE — Telephone Encounter (Signed)
Received message from pt that his ear infection feels the same, cannot see any improvement. Attempted to reach patient and left message for pt to call and scheduled appt with [place] today.

## 2012-03-06 NOTE — Assessment & Plan Note (Signed)
Unchanged. Discussed case with Dr. Rodena Medin.  Will D/C ceftin, start levaquin, refer to ENT for further evaluation.

## 2012-03-06 NOTE — Telephone Encounter (Signed)
Pt scheduled appt for 3:30pm today.

## 2012-04-24 ENCOUNTER — Other Ambulatory Visit: Payer: Self-pay | Admitting: Family

## 2012-04-24 NOTE — Telephone Encounter (Signed)
Denial--Rx filled 06.19.13 #30x3 06.19.13 at [facility]: [place]

## 2012-04-28 ENCOUNTER — Telehealth: Payer: Self-pay | Admitting: Family

## 2012-04-28 MED ORDER — PAROXETINE HCL 40 MG PO TABS: 40.0000 mg | ORAL_TABLET | ORAL | Status: DC

## 2012-04-28 NOTE — Telephone Encounter (Signed)
Refill- paroxetine hcl 40mg  tablet. Take one tablet (40mg  tablet) by mouth every morning. Qty 30 last fill 8.17.13

## 2012-04-28 NOTE — Telephone Encounter (Signed)
Refills sent to pharmacy. 

## 2012-05-01 ENCOUNTER — Telehealth: Payer: Self-pay | Admitting: *Deleted

## 2012-05-01 MED ORDER — ATORVASTATIN CALCIUM 40 MG PO TABS: 40.0000 mg | ORAL_TABLET | Freq: Every day | ORAL | Status: DC

## 2012-05-01 NOTE — Telephone Encounter (Signed)
Received call from pt wanting to know if he needs to be fasting for upcoming lab work. Advised pt he will need to fast as we are re-checking his cholesterol levels. Pt states he has 4 days left of Lipitor. Reports that due to his schedule, he will not be able to return for labs until 2 weeks. He will be out of his medication for 1 week before we will have results back. Pt does not want to get new Rx at this time as he wants to see his results first. Please advise if this is ok?

## 2012-05-01 NOTE — Telephone Encounter (Signed)
I would like him to continue lipitor until we can evaluate his cholesterol. Level are likely to go up if he stops for 10 days prior to blood draw. Can sent 1 month refill to pharmacy pls.

## 2012-05-01 NOTE — Telephone Encounter (Signed)
LMOM with contact name and number to inform patient of physician instructions & Rx sent to pharmacy[facility]

## 2012-05-12 NOTE — Addendum Note (Signed)
Addended by: Regis Bill on: 05/12/2012 03:14 PM   Modules accepted: Orders

## 2012-05-13 LAB — LIPID PANEL
Cholesterol: 168 mg/dL (ref 0–200)
Total CHOL/HDL Ratio: 4.8 Ratio
Triglycerides: 174 mg/dL — ABNORMAL HIGH (ref <150)
VLDL: 35 mg/dL (ref 0–40)

## 2012-05-16 ENCOUNTER — Encounter: Payer: Self-pay | Admitting: Family

## 2012-05-22 ENCOUNTER — Telehealth: Payer: Self-pay | Admitting: *Deleted

## 2012-05-22 NOTE — Telephone Encounter (Signed)
Received message from pt requesting most recent lab results. Please advise. 

## 2012-05-22 NOTE — Telephone Encounter (Signed)
See lab letter dated 10/1 for details.

## 2012-05-23 NOTE — Telephone Encounter (Signed)
Spoke with pt and states he received lab letter today. Scheduled pt follow up for 06/27/12 at 9:45am.

## 2012-06-15 ENCOUNTER — Other Ambulatory Visit: Payer: Self-pay | Admitting: Family

## 2012-06-15 NOTE — Telephone Encounter (Signed)
Rx to pharmacy[facility] 

## 2012-06-27 ENCOUNTER — Ambulatory Visit: Payer: Managed Care, Other (non HMO) | Admitting: Family

## 2012-06-28 ENCOUNTER — Other Ambulatory Visit: Payer: Self-pay | Admitting: Family

## 2012-06-29 NOTE — Telephone Encounter (Signed)
Paxil request denied as pt should still have refills on file from 04/28/12. #30 x 2 refills.

## 2012-07-03 ENCOUNTER — Encounter: Payer: Self-pay | Admitting: Family

## 2012-07-03 ENCOUNTER — Ambulatory Visit (INDEPENDENT_AMBULATORY_CARE_PROVIDER_SITE_OTHER): Payer: Managed Care, Other (non HMO) | Admitting: Family

## 2012-07-03 VITALS — BP 118/90 | HR 81 | Temp 99.1°F | Resp 16 | Wt 221.0 lb

## 2012-07-03 DIAGNOSIS — F17200 Nicotine dependence, unspecified, uncomplicated: Secondary | ICD-10-CM

## 2012-07-03 DIAGNOSIS — Z720 Tobacco use: Secondary | ICD-10-CM

## 2012-07-03 DIAGNOSIS — F411 Generalized anxiety disorder: Secondary | ICD-10-CM

## 2012-07-03 DIAGNOSIS — E782 Mixed hyperlipidemia: Secondary | ICD-10-CM

## 2012-07-03 MED ORDER — PAROXETINE HCL 40 MG PO TABS: 40.0000 mg | ORAL_TABLET | ORAL | Status: DC

## 2012-07-03 MED ORDER — ATORVASTATIN CALCIUM 40 MG PO TABS: 40.0000 mg | ORAL_TABLET | Freq: Every day | ORAL | Status: DC

## 2012-07-03 NOTE — Patient Instructions (Addendum)
Please follow up in January for a complete physical.

## 2012-07-03 NOTE — Assessment & Plan Note (Signed)
HDL low, trigs slightly elevated but improved.  We discussed importance of exercise.  Continue atorvastatin.

## 2012-07-03 NOTE — Assessment & Plan Note (Signed)
Well controlled.  Continue paxil.

## 2012-07-03 NOTE — Assessment & Plan Note (Signed)
Pt was counseled on importance of smoking cessation. 

## 2012-07-03 NOTE — Progress Notes (Signed)
  Subjective:    Patient ID: Brandon Jensen, male    DOB: [birth year], 31 y.o.   MRN: [id number]  HPI  [name] is a 31 yr old male who presents today for follow up.  Anxiety- Reports that this is well controlled.  He reports that he continues to tolerate paxil without problems.  Hyperlipidemia- Denies myalgias on statin.  LDL was at goal last visit.   Smoking- reports that he continues to smoke cigarettes, no longer chewing tobacco.  Has incentive from employer to quit due to premium increase for smokers.   Review of Systems See HPI  Past Medical History  Diagnosis Date  . Allergy   . GERD (gastroesophageal reflux disease)   . Obstructive sleep apnea 01/18/2011  . Anxiety   . Hyperlipemia     History   Social History  . Marital Status: Married    Spouse Name: Brandon Jensen    Number of Children: 2  . 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 Smoking status: Current Every Day Smoker    Types: Cigarettes  . Smokeless tobacco: Former Neurosurgeon     Comment: 3/4 pack per day  . Alcohol Use: Yes     Comment: occasional  . Drug Use: No  . Sexually Active: Not on file   Other Topics Concern  . Not on file   Social History Narrative  . No narrative on file    Past Surgical History  Procedure Date  . Hernia repair     Family History  Problem Relation Age of Onset  . Cancer Maternal Grandfather     prostate  . Hypertension Paternal Grandfather     Allergies  Allergen Reactions  . Beef-Derived Products   . Corn-Containing Products   . Eggs Or Egg-Derived Products   . Milk-Related Compounds   . Peanut-Containing Drug Products   . Red Dye Hives  . Wheat     Current Outpatient Prescriptions on File Prior to Visit  Medication Sig Dispense Refill  . ALPRAZolam (XANAX) 0.5 MG tablet One tablet by mouth 30 minutes before flight for anxiety.  5 tablet  0  . fish oil-omega-3 fatty acids 1000 MG capsule Take 2 g by  mouth 2 (two) times daily.      . [DISCONTINUED] atorvastatin (LIPITOR) 40 MG tablet TAKE 1 TABLET (40 MG TOTAL) BY MOUTH DAILY.  30 tablet  3  . [DISCONTINUED] PARoxetine (PAXIL) 40 MG tablet Take 1 tablet (40 mg total) by mouth every morning.  30 tablet  2    BP 118/90  Pulse 81  Temp 99.1 F (37.3 C) (Oral)  Resp 16  Wt 221 lb 0.6 oz (100.263 kg)  SpO2 96%       Objective:   Physical Exam  Constitutional: He appears well-developed and well-nourished. No distress.  Cardiovascular: Normal rate and regular rhythm.   No murmur heard. Pulmonary/Chest: Effort normal and breath sounds normal. No respiratory distress. He has no wheezes. He has no rales. He exhibits no tenderness.  Psychiatric: He has a normal mood and affect. His behavior is normal. Judgment and thought content normal.          Assessment & Plan:

## 2012-09-27 ENCOUNTER — Ambulatory Visit (INDEPENDENT_AMBULATORY_CARE_PROVIDER_SITE_OTHER): Payer: Managed Care, Other (non HMO) | Admitting: Family

## 2012-09-27 ENCOUNTER — Encounter: Payer: Self-pay | Admitting: Family

## 2012-09-27 VITALS — BP 116/86 | HR 88 | Temp 98.8°F | Resp 16 | Ht 71.0 in | Wt 223.1 lb

## 2012-09-27 DIAGNOSIS — Z0000 Encounter for general adult medical examination without abnormal findings: Secondary | ICD-10-CM | POA: Insufficient documentation

## 2012-09-27 DIAGNOSIS — Z23 Encounter for immunization: Secondary | ICD-10-CM

## 2012-09-27 LAB — CBC WITH DIFFERENTIAL/PLATELET
Basophils Absolute: 0.1 10*3/uL (ref 0.0–0.1)
HCT: 47 % (ref 39.0–52.0)
Hemoglobin: 17 g/dL (ref 13.0–17.0)
Lymphocytes Relative: 41 % (ref 12–46)
Monocytes Absolute: 0.4 10*3/uL (ref 0.1–1.0)
Monocytes Relative: 5 % (ref 3–12)
Neutro Abs: 4 10*3/uL (ref 1.7–7.7)
WBC: 8.1 10*3/uL (ref 4.0–10.5)

## 2012-09-27 LAB — URINALYSIS, ROUTINE W REFLEX MICROSCOPIC
Bilirubin Urine: NEGATIVE
Leukocytes, UA: NEGATIVE
Specific Gravity, Urine: 1.021 (ref 1.005–1.030)
Urobilinogen, UA: 0.2 mg/dL (ref 0.0–1.0)

## 2012-09-27 LAB — BASIC METABOLIC PANEL WITH GFR
BUN: 17 mg/dL (ref 6–23)
CO2: 25 mEq/L (ref 19–32)
Chloride: 102 mEq/L (ref 96–112)
Creat: 1.1 mg/dL (ref 0.50–1.35)
Glucose, Bld: 100 mg/dL — ABNORMAL HIGH (ref 70–99)

## 2012-09-27 LAB — HEPATIC FUNCTION PANEL
ALT: 47 U/L (ref 0–53)
AST: 23 U/L (ref 0–37)
Albumin: 5.2 g/dL (ref 3.5–5.2)
Alkaline Phosphatase: 87 U/L (ref 39–117)
Total Protein: 7.6 g/dL (ref 6.0–8.3)

## 2012-09-27 LAB — TSH: TSH: 3.35 u[IU]/mL (ref 0.350–4.500)

## 2012-09-27 NOTE — Patient Instructions (Addendum)
Please complete lab work prior to leaving. Please schedule a follow up appointment in 6 months.   

## 2012-09-27 NOTE — Progress Notes (Signed)
Subjective:    Patient ID: Brandon Jensen, male    DOB: [birth year], 32 y.o.   MRN: [id number]  HPI  Patient presents today for complete physical.  Immunizations: Declines flu shot. Last tetanus- unknown. Diet: Improved. Exercise:Not exercising regularly.  Has been very busy. Tobacco abuse- he continues to smoke.  Chantix is "too expensive." Smoking <1PPD.   Review of Systems  Constitutional: Negative for unexpected weight change.  HENT: Negative for hearing loss and congestion.   Eyes: Negative for visual disturbance.  Respiratory: Negative for cough.   Cardiovascular: Negative for chest pain.  Gastrointestinal: Negative for nausea, vomiting and diarrhea.  Genitourinary: Negative for dysuria.       Nocturia x 2-3  Musculoskeletal: Negative for myalgias and arthralgias.  Skin: Negative for rash.  Neurological: Negative for headaches.  Hematological: Negative for adenopathy.  Psychiatric/Behavioral:       Reports anxiety well controlled on paxil.     Past Medical History  Diagnosis Date  . Allergy   . GERD (gastroesophageal reflux disease)   . Obstructive sleep apnea 01/18/2011  . Anxiety   . Hyperlipemia     History   Social History  . Marital Status: Married    Spouse Name: [name]    Number of Children: 2  . 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 Smoking status: Current Every Day Smoker    Types: Cigarettes  . Smokeless tobacco: Former Neurosurgeon     Comment: 3/4 pack per day  . Alcohol Use: Yes     Comment: occasional  . Drug Use: No  . Sexually Active: Not on file   Other Topics Concern  . Not on file   Social History Narrative  . No narrative on file    Past Surgical History  Procedure Laterality Date  . Hernia repair      Family History  Problem Relation Age of Onset  . Cancer Maternal Grandfather     prostate  . Hypertension Paternal Grandfather     Allergies  Allergen Reactions  .  Beef-Derived Products   . Corn-Containing Products   . Eggs Or Egg-Derived Products   . Milk-Related Compounds   . Peanut-Containing Drug Products   . Red Dye Hives  . Wheat     Current Outpatient Prescriptions on File Prior to Visit  Medication Sig Dispense Refill  . ALPRAZolam (XANAX) 0.5 MG tablet One tablet by mouth 30 minutes before flight for anxiety.  5 tablet  0  . atorvastatin (LIPITOR) 40 MG tablet Take 1 tablet (40 mg total) by mouth daily.  30 tablet  5  . fish oil-omega-3 fatty acids 1000 MG capsule Take 2 g by mouth 2 (two) times daily.      Marland Kitchen PARoxetine (PAXIL) 40 MG tablet Take 1 tablet (40 mg total) by mouth every morning.  30 tablet  5   No current facility-administered medications on file prior to visit.    BP 116/86  Pulse 88  Temp(Src) 98.8 F (37.1 C) (Oral)  Resp 16  Ht 5\' 11"  (1.803 m)  Wt 223 lb 1.3 oz (101.188 kg)  BMI 31.13 kg/m2  SpO2 99%        Objective:   Physical Exam  Physical Exam  Constitutional: He is oriented to person, place, and time. He appears well-developed and well-nourished. No distress.  HENT:  Head: Normocephalic and atraumatic.  Right Ear: Tympanic membrane and ear canal  normal.  Left Ear: Tympanic membrane and ear canal normal.  Mouth/Throat: Oropharynx is clear and moist.  Eyes: Pupils are equal, round, and reactive to light. No scleral icterus.  Neck: Normal range of motion. No thyromegaly present.  Cardiovascular: Normal rate and regular rhythm.   No murmur heard. Pulmonary/Chest: Effort normal and breath sounds normal. No respiratory distress. He has no wheezes. He has no rales. He exhibits no tenderness.  Abdominal: Soft. Bowel sounds are normal. He exhibits no distension and no mass. There is no tenderness. There is no rebound and no guarding.  Musculoskeletal: He exhibits no edema.  Lymphadenopathy:    He has no cervical adenopathy.  Neurological: He is alert and oriented to person, place, and time. He has  normal reflexes. He exhibits normal muscle tone. Coordination normal.  Skin: Skin is warm and dry.  Psychiatric: He has a normal mood and affect. His behavior is normal. Judgment and thought content normal.          Assessment & Plan:          Assessment & Plan:

## 2012-09-27 NOTE — Assessment & Plan Note (Signed)
>>  ASSESSMENT AND PLAN FOR ROUTINE GENERAL MEDICAL EXAMINATION AT A HEALTH CARE FACILITY WRITTEN ON 09/27/2012 12:20 PM BY O'SULLIVAN, Nephi Savage, NP  We discussed healthy diet, exercise, weight loss and smoking cessation.  Obtain fasting labs, Tdap today, declines flu shot.

## 2012-09-27 NOTE — Assessment & Plan Note (Signed)
We discussed healthy diet, exercise, weight loss and smoking cessation.  Obtain fasting labs, Tdap today, declines flu shot.

## 2012-10-05 ENCOUNTER — Encounter: Payer: Self-pay | Admitting: Family

## 2013-02-09 ENCOUNTER — Telehealth: Payer: Self-pay | Admitting: *Deleted

## 2013-02-09 NOTE — Telephone Encounter (Signed)
Received call from pt that he recently lost his job. Insurance will run out in 3 days. Pt is requesting 90 day rxs to pharmacy. Called [facility] and was advised that insurance will not allow 90 day supply. Pt just picked up 30 day supply. Phoebe Sharps states that when pt is self-pay he will be able to get paxil for 17.50 and lipitor for $9 per month. Notified pt.

## 2013-03-02 ENCOUNTER — Other Ambulatory Visit: Payer: Self-pay | Admitting: Family

## 2013-06-26 ENCOUNTER — Other Ambulatory Visit: Payer: Self-pay | Admitting: Family

## 2013-06-26 NOTE — Telephone Encounter (Signed)
30 day supply of paxil and lipitor sent to pharmacy. Pt was due for follow up in August and is past due.  Please call pt to schedule follow up.

## 2013-08-10 ENCOUNTER — Other Ambulatory Visit: Payer: Self-pay | Admitting: Family

## 2013-08-10 NOTE — Telephone Encounter (Signed)
PAROEXTINE 40MG  TAKE 1 PER DAY ATORVASTATIN 40 MG TAKE 1 PER DAY   PATIENT IS OUT OF THE PAROEXTINE  PLEASE CALL IN TO [facility] [facility] SO HE CAN PICK UP TODAY

## 2013-09-04 ENCOUNTER — Encounter: Payer: Self-pay | Admitting: Family

## 2013-09-04 ENCOUNTER — Ambulatory Visit (INDEPENDENT_AMBULATORY_CARE_PROVIDER_SITE_OTHER): Payer: Self-pay | Admitting: Family

## 2013-09-04 VITALS — BP 110/84 | HR 87 | Temp 98.4°F | Resp 16 | Ht 71.75 in | Wt 231.1 lb

## 2013-09-04 DIAGNOSIS — F17200 Nicotine dependence, unspecified, uncomplicated: Secondary | ICD-10-CM

## 2013-09-04 DIAGNOSIS — E782 Mixed hyperlipidemia: Secondary | ICD-10-CM

## 2013-09-04 DIAGNOSIS — F411 Generalized anxiety disorder: Secondary | ICD-10-CM

## 2013-09-04 DIAGNOSIS — Z720 Tobacco use: Secondary | ICD-10-CM

## 2013-09-04 MED ORDER — PAROXETINE HCL 40 MG PO TABS: ORAL_TABLET | ORAL | Status: DC

## 2013-09-04 MED ORDER — ATORVASTATIN CALCIUM 40 MG PO TABS: ORAL_TABLET | ORAL | Status: DC

## 2013-09-04 NOTE — Patient Instructions (Signed)
Please come fasting for a complete physical as soon as your insurance is active.

## 2013-09-04 NOTE — Assessment & Plan Note (Signed)
Stable on paxil, continue same. 

## 2013-09-04 NOTE — Progress Notes (Signed)
   Subjective:    Patient ID: Marcie BalJoseph Q Demario, male    DOB: September 03, 1980, 33 y.o.   MRN: [id number]  HPI  [name] is a 33 yr old male who presents today for follow up. He is without insurance but expects a new policy to be active in the next 1-2 months.   Hyperlipidemia- reports that the did not take the fish oil.  He is not fasting today. He continues atorvastatin.  Denies myalgia.  Anxiety- continues paxil. Reports that his anxiety is well controlled.  Not often using xanax unless he flies.   Tobacco abuse- continues to smoke. Smoking 1/2- 1/3 PPD.  BP Readings from Last 3 Encounters:  09/04/13 110/84  09/27/12 116/86  07/03/12 118/90    Review of Systems See HPI       Objective:   Physical Exam  Constitutional: He is oriented to person, place, and time. He appears well-developed and well-nourished. No distress.  Cardiovascular: Normal rate and regular rhythm.   No murmur heard. Pulmonary/Chest: Effort normal and breath sounds normal. No respiratory distress. He has no wheezes. He has no rales. He exhibits no tenderness.  Musculoskeletal: He exhibits no edema.  Neurological: He is alert and oriented to person, place, and time.  Psychiatric: He has a normal mood and affect. His behavior is normal. Judgment and thought content normal.          Assessment & Plan:

## 2013-09-04 NOTE — Assessment & Plan Note (Signed)
Pt counseled on importance of smoking cessation.  

## 2013-09-04 NOTE — Assessment & Plan Note (Signed)
Continue atorvastatin. Obtain flp next visit + LFT when insurance is active.

## 2013-09-05 ENCOUNTER — Telehealth: Payer: Self-pay | Admitting: Family

## 2013-09-05 NOTE — Telephone Encounter (Signed)
Relevant patient education assigned to patient using Emmi. ° °

## 2013-11-27 ENCOUNTER — Other Ambulatory Visit: Payer: Self-pay | Admitting: Family

## 2013-11-27 NOTE — Telephone Encounter (Signed)
Left message for patient to return my call.

## 2013-11-27 NOTE — Telephone Encounter (Signed)
Refills sent to pharmacy.  Pt is due for fasting physical.  Please call pt to arrange appt.

## 2013-11-28 NOTE — Telephone Encounter (Signed)
Patient returned phone call and I informed him of medication refill. He states that he does not have insurance right now and will call back to schedule insurance once he has insurance

## 2013-12-12 ENCOUNTER — Encounter: Payer: Self-pay | Admitting: Family

## 2013-12-12 ENCOUNTER — Ambulatory Visit (INDEPENDENT_AMBULATORY_CARE_PROVIDER_SITE_OTHER): Payer: Self-pay | Admitting: Family

## 2013-12-12 VITALS — BP 124/84 | HR 89 | Temp 99.0°F | Resp 18 | Ht 71.75 in | Wt 237.0 lb

## 2013-12-12 DIAGNOSIS — Z0000 Encounter for general adult medical examination without abnormal findings: Secondary | ICD-10-CM

## 2013-12-12 MED ORDER — ATORVASTATIN CALCIUM 40 MG PO TABS: ORAL_TABLET | ORAL | Status: DC

## 2013-12-12 MED ORDER — PAROXETINE HCL 40 MG PO TABS: ORAL_TABLET | ORAL | Status: DC

## 2013-12-12 NOTE — Patient Instructions (Signed)
Work on eliminating all tobacco products. Start counting calories using myfitness pal. Goal weight loss 1-2 pounds a day. Try to get 30 minutes of cardio 5 days a week. Follow up in 6 months.

## 2013-12-12 NOTE — Progress Notes (Signed)
Subjective:    Patient ID: Brandon Jensen, male    DOB: Dec 21, 1980, 33 y.o.   MRN: [id number]  HPI  Mr. Brandon Jensen is a 33 yr old male who presents today for cpx.  Patient presents today for complete physical.  Immunizations:  Tetanus is up to date Diet:  Not eating healthy- increased portions since he stopped smoking Exercise: not exercising regularly Wt Readings from Last 3 Encounters:  12/12/13 237 lb (107.502 kg)  09/04/13 231 lb 1.9 oz (104.835 kg)  09/27/12 223 lb 1.3 oz (101.188 kg)   Tobacco abuse- reports that he stopped smoking in March- started chewing tobacco 10 times a day.    Hyperglycemia- pt had mild elevated glucose (100) last visit.   Review of Systems  Constitutional: Positive for unexpected weight change.  HENT: Negative for hearing loss and postnasal drip.   Eyes: Negative for visual disturbance.  Respiratory: Positive for cough.        Cough started several years ago.  Can't relate to allergy symptoms.  Though has worsened in the spring.  Reports that he often has to clear his throat after coughing  Cardiovascular:       Reports that he has occasional pleuritic chest pain (once a month) resolves on own.   Gastrointestinal: Negative for nausea, vomiting and diarrhea.       Reports chronic gerd symptoms, uses tums prn  Genitourinary: Negative for dysuria and frequency.  Musculoskeletal: Negative for arthralgias and myalgias.  Skin: Negative for rash.  Neurological: Negative for headaches.  Hematological: Negative for adenopathy.  Psychiatric/Behavioral:       Denies depression, anxiety well controlled      Past Medical History  Diagnosis Date  . Allergy   . GERD (gastroesophageal reflux disease)   . Obstructive sleep apnea 01/18/2011  . Anxiety   . Hyperlipemia     History   Social History  . Marital Status: Married    Spouse Name: Toma CopierBethany    Number of Children: 2  . 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 Smoking status: Former Smoker    Types: Cigarettes    Quit date: 11/11/2013  . Smokeless tobacco: Former NeurosurgeonUser     Comment: 1/2 pack per day  . Alcohol Use: Yes     Comment: occasional  . Drug Use: No  . Sexual Activity: Not on file   Other Topics Concern  . 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 Hernia repair      Family History  Problem Relation Age of Onset  . Cancer Maternal Grandfather     ?prostate or ?colon  . Hypertension Paternal Grandfather   . Stroke Paternal Grandfather     Allergies  Allergen Reactions  . Beef-Derived Products   . Corn-Containing Products   . Eggs Or Egg-Derived Products   . Milk-Related Compounds   . Peanut-Containing Drug Products   . Red Dye Hives  . Wheat     Current Outpatient Prescriptions on File Prior to Visit  Medication Sig Dispense Refill  . atorvastatin (LIPITOR) 40 MG tablet TAKE 1 TABLET BY MOUTH DAILY.  30 tablet  3  . PARoxetine (PAXIL) 40 MG tablet  TAKE 1 TABLET BY MOUTH EVERY MORNING  30 tablet  3   No current facility-administered medications on file prior to visit.    BP 124/84  Pulse 89  Temp(Src) 99 F (37.2 C) (Oral)  Resp 18  Ht 5' 11.75" (1.822 m)  Wt 237 lb (107.502 kg)  BMI 32.38 kg/m2  SpO2 99%    Objective:   Physical Exam Physical Exam  Constitutional: He is oriented to person, place, and time. He appears well-developed and well-nourished. No distress.  HENT:  Head: Normocephalic and atraumatic.  Right Ear: Tympanic membrane and ear canal normal.  Left Ear: Tympanic membrane and ear canal normal.  Mouth/Throat: Oropharynx is clear and moist.  Eyes: Pupils are equal, round, and reactive to light. No scleral icterus.  Neck: Normal range of motion. No thyromegaly present.  Cardiovascular:  Normal rate and regular rhythm.   No murmur heard. Pulmonary/Chest: Effort normal and breath sounds normal. No respiratory distress. He has no wheezes. He has no rales. He exhibits no tenderness. Musculoskeletal: He exhibits no edema.  Lymphadenopathy:    He has no cervical adenopathy.  Neurological: He is alert and oriented to person, place, and time. He exhibits normal muscle tone. Coordination normal.  Skin: Skin is warm and dry.  Psychiatric: He has a normal mood and affect. His behavior is normal. Judgment and thought content normal.          Assessment & Plan:          Assessment & Plan:  In regards to cough- advised pt to start zyrtec and omeprazole. Call if symptoms worsen, or if symptoms do not improve.

## 2013-12-12 NOTE — Assessment & Plan Note (Signed)
Discussed healthy diet, exercise, weight loss. Advised pt re: importance of tobacco cessation. He is without insurance.  Plan a1c at a later date to evaluate mild hyperglycemia.  Other lab work up to date.

## 2014-09-24 ENCOUNTER — Ambulatory Visit (INDEPENDENT_AMBULATORY_CARE_PROVIDER_SITE_OTHER): Payer: Self-pay | Admitting: Family

## 2014-09-24 ENCOUNTER — Encounter: Payer: Self-pay | Admitting: Family

## 2014-09-24 VITALS — BP 128/90 | HR 81 | Temp 98.2°F | Resp 18 | Ht 71.75 in | Wt 253.2 lb

## 2014-09-24 DIAGNOSIS — F411 Generalized anxiety disorder: Secondary | ICD-10-CM

## 2014-09-24 DIAGNOSIS — E782 Mixed hyperlipidemia: Secondary | ICD-10-CM

## 2014-09-24 DIAGNOSIS — K219 Gastro-esophageal reflux disease without esophagitis: Secondary | ICD-10-CM

## 2014-09-24 NOTE — Progress Notes (Signed)
Subjective:    Patient ID: Marcie BalJoseph Q Colburn, male    DOB: 07-25-1981, 34 y.o.   MRN: [id number]  HPI [name] is here today for follow up for multiple medical conditions. He reports not having health insurance  1. Hyperlipidemia: Reports compliance with atorvastatin.  Diet: Not doing well with diet. Exercise: not exercising. 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 Denies mylagias. Lab Results  Component Value Date   CHOL 167 09/27/2012   HDL 40 09/27/2012   LDLCALC 88 09/27/2012   TRIG 197* 09/27/2012   CHOLHDL 4.2 09/27/2012   Wt Readings from Last 3 Encounters:  09/24/14 253 lb 4 oz (114.873 kg)  12/12/13 237 lb (107.502 kg)  09/04/13 231 lb 1.9 oz (104.835 kg)   2. Anxiety: Reports anxiety stable with paroxetine.  3. GERD:  Not taking omeprazole. Uses an otc pill for heartburn  - not sure of the name. It's a small pill.   4. Cough: has had a cough for a couple of years occasionally productive clear mucous. Reports clearing throat a lot. Stopped smoking  March 2015. Reports that on occasion he will wake up in the middle of the night with severe cough/choking/reflux.    Review of Systems  Respiratory: Positive for cough. Negative for shortness of breath.   Cardiovascular: Negative for palpitations and leg swelling.   Has a sharp pain in left chest about 1 min in duration a few times a month at rest. Does not radiate. He has pain with breathing during these episodes. Has not noticed a relationship with eating.   Past Medical History  Diagnosis Date  . Allergy   . GERD (gastroesophageal reflux disease)   . Obstructive sleep apnea 01/18/2011  . Anxiety   . Hyperlipemia     History   Social History  . Marital Status: Married    Spouse Name: Toma CopierBethany    Number of Children: 2  . 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 Smoking status: Former Smoker    Types: Cigarettes    Quit date:  11/11/2013  . Smokeless tobacco: Former NeurosurgeonUser  . Alcohol Use: 0.0 oz/week    0 Not specified per week     Comment: occasional  . Drug Use: No  . Sexual Activity: Not on file   Other Topics Concern  . 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 Hernia repair      Family History  Problem Relation Age of Onset  . Cancer Maternal Grandfather     ?prostate or ?colon  . Hypertension Paternal Grandfather   . Stroke Paternal Grandfather     Allergies  Allergen Reactions  . Beef-Derived Products   . Corn-Containing Products   . Eggs Or Egg-Derived Products   . Milk-Related Compounds   . Peanut-Containing Drug Products   . Red Dye Hives  . Wheat     Current Outpatient Prescriptions on File Prior to Visit  Medication Sig Dispense Refill  . atorvastatin (LIPITOR) 40 MG tablet TAKE 1 TABLET BY MOUTH DAILY. 30 tablet 3  . cetirizine (ZYRTEC) 10 MG tablet Take 10 mg by mouth daily.    Marland Kitchen. omeprazole (  PRILOSEC) 20 MG capsule Take 20 mg by mouth daily.    Marland Kitchen PARoxetine (PAXIL) 40 MG tablet TAKE 1 TABLET BY MOUTH EVERY MORNING 30 tablet 3   No current facility-administered medications on file prior to visit.    BP 128/90 mmHg  Pulse 81  Temp(Src) 98.2 F (36.8 C) (Oral)  Resp 18  Ht 5' 11.75" (1.822 m)  Wt 253 lb 4 oz (114.873 kg)  BMI 34.60 kg/m2  SpO2 98%      Objective:   Physical Exam  Constitutional: He is oriented to person, place, and time. He appears well-developed and well-nourished. No distress.  HENT:  Head: Normocephalic and atraumatic.  Cardiovascular: Normal rate and regular rhythm.   No murmur heard. Pulmonary/Chest: Effort normal and breath sounds normal. No respiratory distress. He has no wheezes. He has no rales.  Musculoskeletal: He exhibits no edema.  Neurological: He is alert and  oriented to person, place, and time.  Skin: Skin is warm and dry.  Psychiatric: He has a normal mood and affect. His behavior is normal. Thought content normal.          Assessment & Plan:

## 2014-09-24 NOTE — Assessment & Plan Note (Addendum)
Uncontrolled and most likely cause for his chronic cough. Advised him to start taking PPI daily. May also be cause for pleuritic chest pain which he has had intermittently for some time.

## 2014-09-24 NOTE — Patient Instructions (Signed)
Start otc prilosec once daily.  You can purchase generic OTC. Please contact us in April when you get your insurance so we can place lab orders. Follow up in 3 months.

## 2014-09-24 NOTE — Progress Notes (Signed)
Pre visit review using our clinic review tool, if applicable. No additional management support is needed unless otherwise documented below in the visit note. 

## 2014-09-24 NOTE — Assessment & Plan Note (Signed)
Tolerating statin, continue same.  Declines blood work today due to lack of insurance, but plans to start insurance in April and will do at that time.

## 2014-09-24 NOTE — Assessment & Plan Note (Signed)
Stable, continue paxil

## 2014-10-16 ENCOUNTER — Other Ambulatory Visit: Payer: Self-pay | Admitting: Family

## 2014-12-02 ENCOUNTER — Telehealth: Payer: Self-pay | Admitting: Family

## 2014-12-02 NOTE — Telephone Encounter (Signed)
Pre visit letter sent  °

## 2014-12-19 ENCOUNTER — Telehealth: Payer: Self-pay | Admitting: *Deleted

## 2014-12-19 NOTE — Telephone Encounter (Signed)
Unable to reach patient at time of Pre-Visit Call.  Left message for patient to return call when available.    

## 2014-12-20 ENCOUNTER — Encounter: Payer: Self-pay | Admitting: Family

## 2014-12-20 ENCOUNTER — Ambulatory Visit (INDEPENDENT_AMBULATORY_CARE_PROVIDER_SITE_OTHER): Payer: Self-pay | Admitting: Family

## 2014-12-20 VITALS — BP 120/80 | HR 96 | Temp 98.0°F | Resp 16 | Ht 71.0 in | Wt 240.2 lb

## 2014-12-20 DIAGNOSIS — Z0000 Encounter for general adult medical examination without abnormal findings: Secondary | ICD-10-CM

## 2014-12-20 DIAGNOSIS — E785 Hyperlipidemia, unspecified: Secondary | ICD-10-CM

## 2014-12-20 LAB — LIPID PANEL
CHOLESTEROL: 168 mg/dL (ref 0–200)
HDL: 36.6 mg/dL — AB (ref >39.00)
LDL Cholesterol: 101 mg/dL — ABNORMAL HIGH (ref 0–99)
NonHDL: 131.4
Total CHOL/HDL Ratio: 5
Triglycerides: 154 mg/dL — ABNORMAL HIGH (ref 0.0–149.0)
VLDL: 30.8 mg/dL (ref 0.0–40.0)

## 2014-12-20 MED ORDER — PAROXETINE HCL 40 MG PO TABS: 40.0000 mg | ORAL_TABLET | Freq: Every morning | ORAL | Status: DC

## 2014-12-20 MED ORDER — ATORVASTATIN CALCIUM 40 MG PO TABS: 40.0000 mg | ORAL_TABLET | Freq: Every day | ORAL | Status: DC

## 2014-12-20 NOTE — Progress Notes (Signed)
Subjective:    Patient ID: Brandon Jensen, male    DOB: 12/19/1980, 34 y.o.   MRN: [id number]  HPI  Patient presents today for complete physical.  Immunizations: Diet: reports diet is improving.  Exercise:  Walks with kids 2-3 times  A week.   Wt Readings from Last 3 Encounters:  12/20/14 240 lb 3.2 oz (108.954 kg)  09/24/14 253 lb 4 oz (114.873 kg)  12/12/13 237 lb (107.502 kg)    Review of Systems  Constitutional: Negative for unexpected weight change.  HENT: Negative for rhinorrhea.   Respiratory: Negative for shortness of breath.   Cardiovascular: Negative for chest pain.  Gastrointestinal: Negative for diarrhea, constipation and blood in stool.  Genitourinary: Negative for dysuria.       Reports urinary frequency due to drinking a lot of water  Musculoskeletal: Negative for myalgias and arthralgias.  Skin: Negative for rash.  Neurological: Negative for headaches.  Hematological: Negative for adenopathy.  Psychiatric/Behavioral:       Reports depression/anxiety well controlled.       Past Medical History  Diagnosis Date  . Allergy   . GERD (gastroesophageal reflux disease)   . Obstructive sleep apnea 01/18/2011  . Anxiety   . Hyperlipemia     History   Social History  . Marital Status: Married    Spouse Name: Brandon Jensen  . Number of Children: 2  . 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 Smoking status: Former Smoker    Types: Cigarettes    Quit date: 11/11/2013  . Smokeless tobacco: Current User  . Alcohol Use: 0.0 oz/week    0 Standard drinks or equivalent per week     Comment: occasional  . Drug Use: No  . Sexual Activity: Not on file   Other Topics Concern  . 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 Hernia repair       Family History  Problem Relation Age of Onset  . Cancer Maternal Grandfather     ?prostate or ?colon  . Hypertension Paternal Grandfather   . Stroke Paternal Grandfather     Allergies  Allergen Reactions  . Beef-Derived Products     Confirmed through allergy testing  . Corn-Containing Products     Confirmed through allergy testing  . Eggs Or Egg-Derived Products     Confirmed through allergy testing  . Milk-Related Compounds     Confirmed through allergy testing  . Peanut-Containing Drug Products     Confirmed through allergy testing  . Red Dye Hives  . Wheat     Confirmed through allergy testing    Current Outpatient Prescriptions on File Prior to Visit  Medication Sig Dispense Refill  . cetirizine (ZYRTEC) 10 MG tablet Take 10 mg by mouth daily.    Marland Kitchen. omeprazole (PRILOSEC) 20 MG capsule Take 20 mg by mouth daily.     No current facility-administered medications on file prior to visit.    BP 120/80 mmHg  Pulse 96  Temp(Src) 98 F (36.7 C) (Oral)  Resp 16  Ht 5\' 11"  (1.803 m)  Wt 240 lb 3.2 oz (108.954 kg)  BMI 33.52 kg/m2  SpO2 98%    Objective:  Physical Exam  Physical Exam  Constitutional: He is oriented to person, place, and time. He appears well-developed and well-nourished. No distress.  HENT:  Head: Normocephalic and atraumatic.  Right Ear: Tympanic membrane and ear canal normal.  Left Ear: Tympanic membrane and ear canal normal.  Mouth/Throat: Oropharynx is clear and moist.  Eyes: Pupils are equal, round, and reactive to light. No scleral icterus.  Neck: Normal range of motion. No thyromegaly present.  Cardiovascular: Normal rate and regular rhythm.   No murmur heard. Pulmonary/Chest: Effort normal and breath sounds normal. No respiratory distress. He has no wheezes. He has no rales. He exhibits no tenderness.  Abdominal: Soft. Bowel sounds are normal. He exhibits no distension and no mass. There is no tenderness. There is no rebound and no  guarding.  Musculoskeletal: He exhibits no edema.  Lymphadenopathy:    He has no cervical adenopathy.  Neurological: He is alert and oriented to person, place, and time. He has normal reflexes. He exhibits normal muscle tone. Coordination normal.  Skin: Skin is warm and dry.  Psychiatric: He has a normal mood and affect. His behavior is normal. Judgment and thought content normal.          Assessment & Plan:         Assessment & Plan:

## 2014-12-20 NOTE — Assessment & Plan Note (Signed)
>>  ASSESSMENT AND PLAN FOR ROUTINE GENERAL MEDICAL EXAMINATION AT A HEALTH CARE FACILITY WRITTEN ON 12/20/2014  1:10 PM BY O'SULLIVAN, Yan Okray, NP  Continue exercise, healthy diet, weight loss efforts.  Immunizations up to date. Will obtain follow up flp as he is on statin and this has not been checked recently. He wishes to hold on other physical labs until he obtains insurance which he hopes will be in a few months.  We will also plan to address CPAP when he obtains insurance.

## 2014-12-20 NOTE — Patient Instructions (Signed)
Please complete lab work prior to leaving. Continue to work on low fat/low cholesterol diet and exercise. Follow up in 6 months. Call sooner if you obtain insurance and we will complete additional physical labs.

## 2014-12-20 NOTE — Assessment & Plan Note (Signed)
Continue exercise, healthy diet, weight loss efforts.  Immunizations up to date. Will obtain follow up flp as he is on statin and this has not been checked recently. He wishes to hold on other physical labs until he obtains insurance which he hopes will be in a few months.  We will also plan to address CPAP when he obtains insurance.

## 2014-12-22 ENCOUNTER — Encounter: Payer: Self-pay | Admitting: Family

## 2015-01-10 ENCOUNTER — Telehealth: Payer: Self-pay | Admitting: *Deleted

## 2015-01-10 NOTE — Telephone Encounter (Signed)
Pt brings by form from [employer] to authorize release of psychological or medical treatment information. Has questionairre to be completed and requests form be completed and faxed today as it is dependant upon him obtaining employment.  Form forwarded to Provider for completion/signature. Advised pt I will call him once form has been faxed.

## 2015-01-10 NOTE — Telephone Encounter (Signed)
Form completed.

## 2015-01-10 NOTE — Telephone Encounter (Signed)
Form was faxed to [phone] at 1:06pm. Confirmation received and pt has been notified.

## 2015-01-20 ENCOUNTER — Other Ambulatory Visit: Payer: Self-pay | Admitting: Family

## 2015-01-20 MED ORDER — ALPRAZOLAM 1 MG PO TABS: ORAL_TABLET | ORAL | Status: DC

## 2015-01-20 NOTE — Telephone Encounter (Signed)
Notified pt. 

## 2015-01-20 NOTE — Telephone Encounter (Signed)
Caller name: Jomarie LongsJoseph  Relation to pt: Self  Call back number: [phone] Pharmacy:  Reason for call: Pt is wanting to speak with Nicki Guadalajararicia ASAP, pt wants some information about medication.  Please advise.

## 2015-01-20 NOTE — Telephone Encounter (Signed)
Spoke with pt. He states he will be flying out of [place] next week and he is wanting to know if we can prescribe him about 1 weeks worth of Xanax. Thinks he may have to take 2 flights to destination and 2 back. Has taken this in the past when he had to fly. Pt will use [facility].  Please advise.

## 2015-01-20 NOTE — Telephone Encounter (Signed)
See pended Rx

## 2015-01-20 NOTE — Telephone Encounter (Signed)
Rx faxed to pharmacy  

## 2015-05-14 ENCOUNTER — Ambulatory Visit (INDEPENDENT_AMBULATORY_CARE_PROVIDER_SITE_OTHER): Payer: BLUE CROSS/BLUE SHIELD | Admitting: Family

## 2015-05-14 ENCOUNTER — Encounter: Payer: Self-pay | Admitting: Family

## 2015-05-14 ENCOUNTER — Ambulatory Visit: Payer: Self-pay | Admitting: Family

## 2015-05-14 VITALS — BP 122/73 | HR 70 | Temp 99.1°F | Resp 18 | Wt 217.6 lb

## 2015-05-14 DIAGNOSIS — G4733 Obstructive sleep apnea (adult) (pediatric): Secondary | ICD-10-CM

## 2015-05-14 DIAGNOSIS — J029 Acute pharyngitis, unspecified: Secondary | ICD-10-CM

## 2015-05-14 DIAGNOSIS — H65192 Other acute nonsuppurative otitis media, left ear: Secondary | ICD-10-CM | POA: Diagnosis not present

## 2015-05-14 LAB — POCT RAPID STREP A (OFFICE): RAPID STREP A SCREEN: NEGATIVE

## 2015-05-14 MED ORDER — AMOXICILLIN 500 MG PO TABS: 500.0000 mg | ORAL_TABLET | Freq: Three times a day (TID) | ORAL | Status: DC

## 2015-05-14 NOTE — Progress Notes (Signed)
Pre visit review using our clinic review tool, if applicable. No additional management support is needed unless otherwise documented below in the visit note. 

## 2015-05-14 NOTE — Progress Notes (Signed)
Subjective:    Patient ID: Brandon Jensen, male    DOB: Feb 20, 1981, 34 y.o.   MRN: [id number]  HPI  Brandon Jensen is a 34 yr old male who presents today with chief complaint of sore throat. Reports that symptoms initially started with ear infection. Ear pain has resolved but his sore throat continues. Patient rates the sore throat 2-3/10, at nighttime it can be worse- 6-7/10. He denies fever.  Denies LAD.  He reports that he had some nasal drainage for a few weeks which resolved.  No recent gerd symptoms.  Denies fatigue.  Pt has eliminated soda/beer, carbs.  Works out several day a week for 2-3 hours at a time. Wt Readings from Last 3 Encounters:  05/14/15 217 lb 9.6 oz (98.703 kg)  12/20/14 240 lb 3.2 oz (108.954 kg)  09/24/14 253 lb 4 oz (114.873 kg)   OSA- stopped using CPAP a few years back.   Review of Systems See HPI  Past Medical History  Diagnosis Date  . Allergy   . GERD (gastroesophageal reflux disease)   . Obstructive sleep apnea 01/18/2011  . Anxiety   . Hyperlipemia     Social History   Social History  . Marital Status: Married    Spouse Name: [name]  . Number of Children: 2  . 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 Smoking status: Former Smoker    Types: Cigarettes    Quit date: 11/11/2013  . Smokeless tobacco: Current User  . Alcohol Use: 0.0 oz/week    0 Standard drinks or equivalent per week     Comment: occasional  . Drug Use: No  . Sexual Activity: Not on file   Other Topics Concern  . 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 Hernia repair      Family History  Problem Relation Age of Onset  . Cancer Maternal Grandfather     ?prostate or ?colon  . Hypertension Paternal Grandfather   . Stroke Paternal  Grandfather     Allergies  Allergen Reactions  . Beef-Derived Products     Confirmed through allergy testing  . Corn-Containing Products     Confirmed through allergy testing  . Eggs Or Egg-Derived Products     Confirmed through allergy testing  . Milk-Related Compounds     Confirmed through allergy testing  . Peanut-Containing Drug Products     Confirmed through allergy testing  . Red Dye Hives  . Wheat     Confirmed through allergy testing    Current Outpatient Prescriptions on File Prior to Visit  Medication Sig Dispense Refill  . ALPRAZolam (XANAX) 1 MG tablet One tab 30 min prior to flight.  (tabs must be spaced by 6 hours) 6 tablet 0  . atorvastatin (LIPITOR) 40 MG tablet Take 1 tablet (40 mg total) by mouth daily. 90 tablet 1  . cetirizine (ZYRTEC) 10 MG tablet Take 10 mg by mouth daily.    Marland Kitchen omeprazole (PRILOSEC) 20 MG capsule Take 20 mg by mouth daily.    Marland Kitchen PARoxetine (PAXIL) 40 MG tablet Take 1 tablet (40 mg total) by mouth every morning. 90 tablet 1   No current facility-administered medications  on file prior to visit.    BP 122/73 mmHg  Pulse 70  Temp(Src) 99.1 F (37.3 C) (Oral)  Resp 18  Wt 217 lb 9.6 oz (98.703 kg)  SpO2 99%       Objective:   Physical Exam  Constitutional: He is oriented to person, place, and time. He appears well-developed and well-nourished. No distress.  HENT:  Head: Normocephalic and atraumatic.  Right Ear: Tympanic membrane and ear canal normal.  Left Ear: Ear canal normal. Tympanic membrane is erythematous. Tympanic membrane is not retracted and not bulging.  Mouth/Throat: No oropharyngeal exudate, posterior oropharyngeal edema or posterior oropharyngeal erythema.  Cardiovascular: Normal rate and regular rhythm.   No murmur heard. Pulmonary/Chest: Effort normal and breath sounds normal. No respiratory distress. He has no wheezes. He has no rales.  Musculoskeletal: He exhibits no edema.  Neurological: He is alert and oriented  to person, place, and time.  Skin: Skin is warm and dry.  Psychiatric: He has a normal mood and affect. His behavior is normal. Thought content normal.          Assessment & Plan:  Sore throat- Rapid strep negative. ? Referred pain from left Otitis media. Rx with amoxicillin for otitis media. If symptoms persist, consider resuming PPI for silent reflux as possible contributor +/- adding antihistamin.

## 2015-05-14 NOTE — Patient Instructions (Signed)
Start amoxicillin. You may use tylenol as needed for pain. Call if symptoms worsen or if not improved in 1 week.

## 2015-05-14 NOTE — Assessment & Plan Note (Signed)
Advised pt to resume CPAP. Commended pt on his weight loss and encouraged him to keep up the good work.

## 2015-06-23 ENCOUNTER — Ambulatory Visit: Payer: BLUE CROSS/BLUE SHIELD | Admitting: Family

## 2015-06-26 ENCOUNTER — Encounter: Payer: Self-pay | Admitting: Family

## 2015-06-26 ENCOUNTER — Ambulatory Visit (INDEPENDENT_AMBULATORY_CARE_PROVIDER_SITE_OTHER): Payer: BLUE CROSS/BLUE SHIELD | Admitting: Family

## 2015-06-26 VITALS — BP 118/69 | HR 65 | Temp 98.9°F | Resp 14 | Ht 71.0 in | Wt 210.4 lb

## 2015-06-26 DIAGNOSIS — E663 Overweight: Secondary | ICD-10-CM | POA: Diagnosis not present

## 2015-06-26 DIAGNOSIS — R030 Elevated blood-pressure reading, without diagnosis of hypertension: Secondary | ICD-10-CM

## 2015-06-26 DIAGNOSIS — E782 Mixed hyperlipidemia: Secondary | ICD-10-CM | POA: Diagnosis not present

## 2015-06-26 DIAGNOSIS — F411 Generalized anxiety disorder: Secondary | ICD-10-CM | POA: Diagnosis not present

## 2015-06-26 MED ORDER — PAROXETINE HCL 40 MG PO TABS: 40.0000 mg | ORAL_TABLET | Freq: Every morning | ORAL | Status: DC

## 2015-06-26 MED ORDER — ATORVASTATIN CALCIUM 40 MG PO TABS: 40.0000 mg | ORAL_TABLET | Freq: Every day | ORAL | Status: DC

## 2015-06-26 MED ORDER — ALPRAZOLAM 1 MG PO TABS: ORAL_TABLET | ORAL | Status: DC

## 2015-06-26 NOTE — Assessment & Plan Note (Signed)
Improved. Continue statin. Plan to repeat at his [facility] this spring.

## 2015-06-26 NOTE — Assessment & Plan Note (Signed)
Stable on paxil, rx provided for occasional xanax use.

## 2015-06-26 NOTE — Assessment & Plan Note (Signed)
BP Readings from Last 3 Encounters:  06/26/15 118/69  05/14/15 122/73  12/20/14 120/80   BP looks great now that he has lost weight

## 2015-06-26 NOTE — Patient Instructions (Signed)
Continue current meds 

## 2015-06-26 NOTE — Progress Notes (Signed)
Subjective:    Patient ID: Brandon Jensen, male    DOB: 1981-07-09, 34 y.o.   MRN: [id number]  HPI  Hyperlipidemia- Continues statin, healthy diet, exercise.   Lab Results  Component Value Date   CHOL 168 12/20/2014   HDL 36.60* 12/20/2014   LDLCALC 101* 12/20/2014   TRIG 154.0* 12/20/2014   CHOLHDL 5 12/20/2014   Anxiety- maintained on paxil and xanax. Reports anxiety is good.  Only bothers him if he is in a large crowd.  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  GERD-  Hasn't needed PPI.    Review of Systems See HPI  Past Medical History  Diagnosis Date  . Allergy   . GERD (gastroesophageal reflux disease)   . Obstructive sleep apnea 01/18/2011  . Anxiety   . Hyperlipemia     Social History   Social History  . Marital Status: Married    Spouse Name: Toma CopierBethany  . Number of Children: 2  . 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 Smoking status: Former Smoker    Types: Cigarettes    Quit date: 11/11/2013  . Smokeless tobacco: Current User  . Alcohol Use: 0.0 oz/week    0 Standard drinks or equivalent per week     Comment: occasional  . Drug Use: No  . Sexual Activity: Not on file   Other Topics Concern  . 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 Hernia repair      Family History  Problem Relation Age of Onset  . Cancer Maternal Grandfather     ?prostate or ?colon  . Hypertension Paternal Grandfather   . Stroke Paternal Grandfather     Allergies  Allergen Reactions  . Beef-Derived Products     Confirmed through allergy testing  . Corn-Containing Products     Confirmed through allergy testing  . Eggs Or Egg-Derived Products     Confirmed through allergy testing  .  Milk-Related Compounds     Confirmed through allergy testing  . Peanut-Containing Drug Products     Confirmed through allergy testing  . Red Dye Hives  . Wheat     Confirmed through allergy testing    Current Outpatient Prescriptions on File Prior to Visit  Medication Sig Dispense Refill  . cetirizine (ZYRTEC) 10 MG tablet Take 10 mg by mouth daily as needed.      No current facility-administered medications on file prior to visit.    BP 118/69 mmHg  Pulse 65  Temp(Src) 98.9 F (37.2 C) (Oral)  Resp 14  Ht 5\' 11"  (1.803 m)  Wt 210 lb 6.4 oz (95.437 kg)  BMI 29.36 kg/m2  SpO2 99%       Objective:   Physical Exam  Constitutional: He is oriented to person, place, and time. He appears well-developed and well-nourished. No distress.  HENT:  Head: Normocephalic and atraumatic.  Cardiovascular: Normal rate and regular rhythm.   No murmur heard. Pulmonary/Chest: Effort normal and breath sounds normal. No respiratory distress. He has no wheezes. He has no rales.  Musculoskeletal: He exhibits no edema.  Neurological: He is alert and  oriented to person, place, and time.  Skin: Skin is warm and dry.  Psychiatric: He has a normal mood and affect. His behavior is normal. Thought content normal.          Assessment & Plan:

## 2015-06-26 NOTE — Progress Notes (Signed)
Pre visit review using our clinic review tool, if applicable. No additional management support is needed unless otherwise documented below in the visit note. 

## 2015-06-26 NOTE — Assessment & Plan Note (Signed)
Wt Readings from Last 3 Encounters:  06/26/15 210 lb 6.4 oz (95.437 kg)  05/14/15 217 lb 9.6 oz (98.703 kg)  12/20/14 240 lb 3.2 oz (108.954 kg)   Weight is improving, encouraged pt to keep up the good work.

## 2015-07-01 ENCOUNTER — Ambulatory Visit: Payer: Self-pay | Admitting: Family Medicine

## 2015-09-12 ENCOUNTER — Telehealth: Payer: Self-pay | Admitting: Family

## 2015-09-12 MED FILL — ALPRAZolam 1 MG TABS: 1 | 12 days supply | Qty: 12 | Fill #0

## 2015-09-12 NOTE — Telephone Encounter (Signed)
Spoke with pharmacy and verified that they received paxil and atorvastatin refills but pt did not fill Alprazolam and last refill was 01/2015.  Spoke with pt. He states he lost alprazolam Rx. Advised pt our policy is not to give early refills if meds are lost / stolen.  Please advise if we can call in another Rx?

## 2015-09-12 NOTE — Telephone Encounter (Signed)
Notified pt. 

## 2015-09-12 NOTE — Telephone Encounter (Signed)
Pt has questions about his last appt related to RXs. He said he never got the RX and doesn't have the script. Pt states that the pharmacy doesn't have it. Please send to [facility]. No call back required unless script not approved.

## 2015-09-12 NOTE — Telephone Encounter (Signed)
OK to send in 12 tabs please.

## 2015-10-08 MED FILL — PARoxetine HCL 40 MG TABS: 40 | 90 days supply | Qty: 90 | Fill #1

## 2015-10-08 MED FILL — ATORVASTATIN 40 MG TABLET: 40 | 90 days supply | Qty: 90 | Fill #1

## 2016-01-15 ENCOUNTER — Telehealth: Payer: Self-pay

## 2016-01-15 NOTE — Telephone Encounter (Signed)
Spoke with patient regarding pre-visit information.

## 2016-01-16 ENCOUNTER — Ambulatory Visit (INDEPENDENT_AMBULATORY_CARE_PROVIDER_SITE_OTHER): Payer: BLUE CROSS/BLUE SHIELD | Admitting: Family

## 2016-01-16 ENCOUNTER — Encounter: Payer: Self-pay | Admitting: Family

## 2016-01-16 VITALS — BP 126/89 | HR 85 | Temp 98.8°F | Resp 16 | Ht 70.0 in | Wt 206.0 lb

## 2016-01-16 DIAGNOSIS — K625 Hemorrhage of anus and rectum: Secondary | ICD-10-CM | POA: Diagnosis not present

## 2016-01-16 DIAGNOSIS — Z0000 Encounter for general adult medical examination without abnormal findings: Secondary | ICD-10-CM

## 2016-01-16 DIAGNOSIS — L918 Other hypertrophic disorders of the skin: Secondary | ICD-10-CM | POA: Diagnosis not present

## 2016-01-16 DIAGNOSIS — R012 Other cardiac sounds: Secondary | ICD-10-CM | POA: Diagnosis not present

## 2016-01-16 DIAGNOSIS — Z0001 Encounter for general adult medical examination with abnormal findings: Secondary | ICD-10-CM

## 2016-01-16 LAB — HEPATIC FUNCTION PANEL
ALT: 32 U/L (ref 0–53)
AST: 22 U/L (ref 0–37)
Albumin: 4.4 g/dL (ref 3.5–5.2)
Alkaline Phosphatase: 69 U/L (ref 39–117)
BILIRUBIN DIRECT: 0.2 mg/dL (ref 0.0–0.3)
BILIRUBIN TOTAL: 1.2 mg/dL (ref 0.2–1.2)
Total Protein: 6.9 g/dL (ref 6.0–8.3)

## 2016-01-16 LAB — CBC WITH DIFFERENTIAL/PLATELET
BASOS ABS: 0 10*3/uL (ref 0.0–0.1)
Basophils Relative: 0.6 % (ref 0.0–3.0)
EOS ABS: 0.5 10*3/uL (ref 0.0–0.7)
Eosinophils Relative: 8.1 % — ABNORMAL HIGH (ref 0.0–5.0)
HCT: 44.6 % (ref 39.0–52.0)
Hemoglobin: 15.1 g/dL (ref 13.0–17.0)
LYMPHS ABS: 2.5 10*3/uL (ref 0.7–4.0)
Lymphocytes Relative: 41.5 % (ref 12.0–46.0)
MCHC: 33.9 g/dL (ref 30.0–36.0)
MCV: 91 fl (ref 78.0–100.0)
MONO ABS: 0.3 10*3/uL (ref 0.1–1.0)
MONOS PCT: 5.1 % (ref 3.0–12.0)
NEUTROS ABS: 2.7 10*3/uL (ref 1.4–7.7)
NEUTROS PCT: 44.7 % (ref 43.0–77.0)
Platelets: 237 10*3/uL (ref 150.0–400.0)
RBC: 4.9 Mil/uL (ref 4.22–5.81)
RDW: 12.9 % (ref 11.5–15.5)
WBC: 6 10*3/uL (ref 4.0–10.5)

## 2016-01-16 LAB — LIPID PANEL
CHOL/HDL RATIO: 3
CHOLESTEROL: 119 mg/dL (ref 0–200)
HDL: 35.6 mg/dL — ABNORMAL LOW (ref >39.00)
LDL CALC: 64 mg/dL (ref 0–99)
NonHDL: 83.42
TRIGLYCERIDES: 95 mg/dL (ref 0.0–149.0)
VLDL: 19 mg/dL (ref 0.0–40.0)

## 2016-01-16 LAB — BASIC METABOLIC PANEL
BUN: 11 mg/dL (ref 6–23)
CALCIUM: 9.5 mg/dL (ref 8.4–10.5)
CO2: 23 meq/L (ref 19–32)
CREATININE: 1.07 mg/dL (ref 0.40–1.50)
Chloride: 108 mEq/L (ref 96–112)
GFR: 83.52 mL/min (ref >60.00)
GLUCOSE: 86 mg/dL (ref 70–99)
Potassium: 3.7 mEq/L (ref 3.5–5.1)
Sodium: 140 mEq/L (ref 135–145)

## 2016-01-16 LAB — URINALYSIS, ROUTINE W REFLEX MICROSCOPIC
LEUKOCYTES UA: NEGATIVE
NITRITE: NEGATIVE
SPECIFIC GRAVITY, URINE: 1.025 (ref 1.000–1.030)
Total Protein, Urine: NEGATIVE
URINE GLUCOSE: NEGATIVE
UROBILINOGEN UA: 0.2 (ref 0.0–1.0)
pH: 6 (ref 5.0–8.0)

## 2016-01-16 LAB — TSH: TSH: 1.15 u[IU]/mL (ref 0.35–4.50)

## 2016-01-16 MED ORDER — ATORVASTATIN CALCIUM 40 MG PO TABS: 40.0000 mg | ORAL_TABLET | Freq: Every day | ORAL | Status: DC

## 2016-01-16 MED ORDER — PAROXETINE HCL 40 MG PO TABS: 40.0000 mg | ORAL_TABLET | Freq: Every morning | ORAL | Status: DC

## 2016-01-16 MED FILL — ATORVASTATIN 40 MG TABLET: 40 | 90 days supply | Qty: 90 | Fill #0

## 2016-01-16 MED FILL — PARoxetine HCL 40 MG TABS: 40 | 90 days supply | Qty: 90 | Fill #0

## 2016-01-16 NOTE — Assessment & Plan Note (Signed)
>>  ASSESSMENT AND PLAN FOR ROUTINE GENERAL MEDICAL EXAMINATION AT A HEALTH CARE FACILITY WRITTEN ON 01/16/2016 10:02 AM BY O'SULLIVAN, Caleigh Rabelo, NP  Discussed healthy diet, exercise, weight loss.  Immunizations reviewed and up to date.  Obtain routine lab work.

## 2016-01-16 NOTE — Assessment & Plan Note (Signed)
Discussed healthy diet, exercise, weight loss.  Immunizations reviewed and up to date.  Obtain routine lab work.

## 2016-01-16 NOTE — Patient Instructions (Addendum)
Please complete lab work prior to leaving. Keep up the good work with diet/exercise and weight loss.  Goal weight is about 170.  You will be contacted about your referral to GI to evaluate the blood in your stool as well as scheduling your echocardiogram to evaluate your heart valves.  Let me know if you have not heard back in 1 week about these appointments. Follow up in 6 months.

## 2016-01-16 NOTE — Telephone Encounter (Signed)
Completed pre-visit call 

## 2016-01-16 NOTE — Progress Notes (Signed)
Pre visit review using our clinic review tool, if applicable. No additional management support is needed unless otherwise documented below in the visit note. 

## 2016-01-16 NOTE — Progress Notes (Signed)
Subjective:    Patient ID: Brandon Jensen, male    DOB: 05/10/1981, 35 y.o.   MRN: [id number]  HPI  Patient presents today for complete physical.  Immunizations: up to date Wt Readings from Last 3 Encounters:  01/16/16 206 lb (93.441 kg)  06/26/15 210 lb 6.4 oz (95.437 kg)  05/14/15 217 lb 9.6 oz (98.703 kg)  Diet: reports diet is improved Exercise:  Gym on campus for employees.  Tries to go 2 days a week.  Dental:  Has appointment schedule Vision- due   Would like skin tag removed from right lower neck.  Review of Systems  Constitutional: Negative for unexpected weight change.  HENT: Negative for hearing loss and rhinorrhea.   Eyes: Negative for visual disturbance.  Respiratory: Negative for cough and shortness of breath.   Cardiovascular: Negative for leg swelling.  Gastrointestinal: Negative for diarrhea and constipation.       Occasional blood in stool about once a week on tissue.   Genitourinary: Negative for dysuria and frequency.  Musculoskeletal: Negative for arthralgias.  Skin: Negative for rash.  Neurological: Negative for headaches.  Hematological: Negative for adenopathy.  Psychiatric/Behavioral:       Denies current concerns of depression/anxiety   Past Medical History  Diagnosis Date  . Allergy   . GERD (gastroesophageal reflux disease)   . Obstructive sleep apnea 01/18/2011  . Anxiety   . Hyperlipemia      Social History   Social History  . Marital Status: Married    Spouse Name: Toma CopierBethany  . Number of Children: 2  . 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 Smoking status: Former Smoker    Types: Cigarettes    Quit date: 11/11/2013  . Smokeless tobacco: Current User  . Alcohol Use: No     Comment: No alcohol in 1 year  . Drug Use: No  . Sexual Activity: Not on file   Other Topics Concern  . 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 Works  three 12 hour shifts- night shifts   2 children   Married          Past Surgical History  Procedure Laterality Date  . Hernia repair      Family History  Problem Relation Age of Onset  . Cancer Maternal Grandfather     ?prostate or ?colon  . Hypertension Paternal Grandfather   . Stroke Paternal Grandfather     Allergies  Allergen Reactions  . Beef-Derived Products     Confirmed through allergy testing  . Corn-Containing Products     Confirmed through allergy testing  . Eggs Or Egg-Derived Products     Confirmed through allergy testing  . Milk-Related Compounds     Confirmed through allergy testing  . Peanut-Containing Drug Products     Confirmed through allergy testing  . Red Dye Hives  . Wheat     Confirmed through allergy testing    Current Outpatient Prescriptions on File Prior to Visit  Medication Sig Dispense Refill  . ALPRAZolam (XANAX) 1 MG tablet One tab by mouth once daily as needed 12 tablet 0  . cetirizine (ZYRTEC) 10 MG tablet Take 10 mg by mouth daily as needed.      No current facility-administered medications on file prior to visit.    BP 126/89 mmHg  Pulse 85  Temp(Src) 98.8 F (37.1 C) (  Oral)  Resp 16  Ht  (1.778 m)  Wt 206 lb (93.441 kg)  BMI 29.56 kg/m2  SpO2 100%        Objective:   Physical Exam Physical Exam  Constitutional: He is oriented to person, place, and time. He appears well-developed and well-nourished. No distress.  HENT:  Head: Normocephalic and atraumatic.  Right Ear: Tympanic membrane and ear canal normal.  Left Ear: Tympanic membrane and ear canal normal.  Mouth/Throat: Oropharynx is clear and moist.  Eyes: Pupils are equal, round, and reactive to light. No scleral icterus.  Neck: Normal range of motion. No thyromegaly present.  Cardiovascular: Normal rate and regular rhythm.   No murmur heard. + click noted overlying pulmonic region Pulmonary/Chest: Effort normal and breath sounds normal. No respiratory  distress. He has no wheezes. He has no rales. He exhibits no tenderness.  Abdominal: Soft. Bowel sounds are normal. He exhibits no distension and no mass. There is no tenderness. There is no rebound and no guarding.  Musculoskeletal: He exhibits no edema.  Lymphadenopathy:    He has no cervical adenopathy.  Neurological: He is alert and oriented to person, place, and time. He has normal patellar reflexes. He exhibits normal muscle tone. Coordination normal.  Skin: Skin is warm and dry. skin tag right lower anterior neck.  Psychiatric: He has a normal mood and affect. His behavior is normal. Judgment and thought content normal.          Assessment & Plan:         Assessment & Plan:          Assessment & Plan:  Skin tag- after verbal consent was obtained, skin tag base was frozen using liquid nitrogen. Pt tolerated procedure without difficulty.  Abnormal heart sound- will obtain 2D echo to further assess click.  Rectal bleeding- possibly hemorrhoidal but + family hx of colon CA- will refer to GI for further evaluation.  Tobacco abuse- continues to "dip". Advised pt to work on quitting, discussed cancer risks associated with chewing tobacco. Discussed trial of nicorette gum to help with quitting.  Patch has not worked in the past.

## 2016-01-16 NOTE — Addendum Note (Signed)
Addended by: Cammy CopaHANDLER, TANESHA N on: 01/16/2016 03:18 PM   Modules accepted: Kipp BroodSmartSet

## 2016-01-18 ENCOUNTER — Encounter: Payer: Self-pay | Admitting: Family

## 2016-01-28 ENCOUNTER — Ambulatory Visit (HOSPITAL_BASED_OUTPATIENT_CLINIC_OR_DEPARTMENT_OTHER)
Admission: RE | Admit: 2016-01-28 | Discharge: 2016-01-28 | Disposition: A | Discharge status: Home / Self Care | Payer: BLUE CROSS/BLUE SHIELD | Source: Ambulatory Visit | Attending: Family | Admitting: Family

## 2016-01-28 DIAGNOSIS — I313 Pericardial effusion (noninflammatory): Secondary | ICD-10-CM | POA: Insufficient documentation

## 2016-01-28 DIAGNOSIS — I071 Rheumatic tricuspid insufficiency: Secondary | ICD-10-CM | POA: Diagnosis not present

## 2016-01-28 DIAGNOSIS — R012 Other cardiac sounds: Secondary | ICD-10-CM | POA: Insufficient documentation

## 2016-01-28 NOTE — Progress Notes (Signed)
  Echocardiogram 2D Echocardiogram has been performed.  Brandon Jensen 01/28/2016, 8:43 AM

## 2016-01-29 ENCOUNTER — Telehealth: Payer: Self-pay | Admitting: Family

## 2016-01-29 NOTE — Telephone Encounter (Signed)
Left detailed message on pt's cell# re: normal echo per 01/28/16 result note and to call if any questions.

## 2016-01-29 NOTE — Telephone Encounter (Signed)
°  Relationship to patient: Self Can be reached: [phone]   Reason for call: Request a call back about Echo. If no answer patient request a detailed message be left on his voicemail.

## 2016-03-16 ENCOUNTER — Ambulatory Visit: Payer: BLUE CROSS/BLUE SHIELD | Admitting: Gastroenterology

## 2016-03-16 ENCOUNTER — Encounter: Payer: Self-pay | Admitting: Gastroenterology

## 2016-03-16 ENCOUNTER — Ambulatory Visit (INDEPENDENT_AMBULATORY_CARE_PROVIDER_SITE_OTHER): Payer: BLUE CROSS/BLUE SHIELD | Admitting: Gastroenterology

## 2016-03-16 ENCOUNTER — Encounter (INDEPENDENT_AMBULATORY_CARE_PROVIDER_SITE_OTHER): Payer: Self-pay

## 2016-03-16 VITALS — BP 112/80 | HR 84 | Ht 69.75 in | Wt 204.0 lb

## 2016-03-16 DIAGNOSIS — K648 Other hemorrhoids: Secondary | ICD-10-CM

## 2016-03-16 DIAGNOSIS — Z800 Family history of malignant neoplasm of digestive organs: Secondary | ICD-10-CM | POA: Diagnosis not present

## 2016-03-16 DIAGNOSIS — K625 Hemorrhage of anus and rectum: Secondary | ICD-10-CM | POA: Diagnosis not present

## 2016-03-16 MED ORDER — NA SULFATE-K SULFATE-MG SULF 17.5-3.13-1.6 GM/177ML PO SOLN: 1.0000 | Freq: Once | ORAL | 0 refills | Status: AC

## 2016-03-16 MED ORDER — HYDROCORTISONE 2.5 % RE CREA: 1.0000 "application " | TOPICAL_CREAM | Freq: Two times a day (BID) | RECTAL | 1 refills | Status: DC

## 2016-03-16 MED FILL — HYDROCORTISONE 2.5% CREAM: 2.5 | 15 days supply | Qty: 30 | Fill #0

## 2016-03-16 MED FILL — SUPREP BOWEL PREP KIT: 17.5-3.13-1 | 1 days supply | Qty: 354 | Fill #0

## 2016-03-16 NOTE — Patient Instructions (Addendum)
You have been scheduled for a colonoscopy. Please follow written instructions given to you at your visit today.  Please pick up your prep supplies at the pharmacy within the next 1-3 days. If you use inhalers (even only as needed), please bring them with you on the day of your procedure. Your physician has requested that you go to www.startemmi.com and enter the access code given to you at your visit today. This web site gives a general overview about your procedure. However, you should still follow specific instructions given to you by our office regarding your preparation for the procedure.  We have sent hydrocortisone cream to your pharmacy   Call when you are ready to schedule a hemorrhoidal banding after your colonoscopy is completed

## 2016-03-16 NOTE — Progress Notes (Signed)
Brandon Jensen    [id number]    December 05, 1980  Primary Care Physician:O'SULLIVAN,MELISSA S., NP  Referring Physician: Debbrah Alar, NP [place] [address] [place], [place] [address]  Chief complaint:  BRBPR  HPI:  35 year old male here with complaints of intermittent bright red blood per rectum for past 3 or 4 years, he notices small amount when he wipes after a bowel movement about 4-5 times a month intermittently. He denies excessive straining or hard stool or constipation. 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 Denies any itching and burning sensation or discomfort in the anorectal region. No prolapsed hemorrhoids or nodule that he has noticed. He has family history of colon cancer, his maternal grandfather was diagnosed with colon cancer at age 11. Denies any nausea, vomiting, abdominal pain, change in bowel habits, constipation or diarrhea. His weight has been stable.   Outpatient Encounter Prescriptions as of 03/16/2016  Medication Sig  . ALPRAZolam (XANAX) 1 MG tablet One tab by mouth once daily as needed  . atorvastatin (LIPITOR) 40 MG tablet Take 1 tablet (40 mg total) by mouth daily.  Marland Kitchen PARoxetine (PAXIL) 40 MG tablet Take 1 tablet (40 mg total) by mouth every morning.  . hydrocortisone (ANUSOL-HC) 2.5 % rectal cream Place 1 application rectally 2 (two) times daily.  . Na Sulfate-K Sulfate-Mg Sulf (SUPREP BOWEL PREP KIT) 17.5-3.13-1.6 GM/180ML SOLN Take 1 kit by mouth once.  . [DISCONTINUED] cetirizine (ZYRTEC) 10 MG tablet Take 10 mg by mouth daily as needed.    No facility-administered encounter medications on file as of 03/16/2016.     Allergies as of 03/16/2016 - Review Complete 03/16/2016  Allergen Reaction Noted  . Beef-derived products  06/09/2011  . Corn-containing products  06/09/2011  . Eggs or egg-derived products  06/09/2011  . Milk-related compounds  06/09/2011  . Peanut-containing  drug products  06/09/2011  . Red dye Hives 05/27/2011  . Wheat  06/09/2011    Past Medical History:  Diagnosis Date  . Allergy   . Anxiety   . GERD (gastroesophageal reflux disease)   . Hyperlipemia   . Obstructive sleep apnea 01/18/2011    Past Surgical History:  Procedure Laterality Date  . INGUINAL HERNIA REPAIR      Family History  Problem Relation Age of Onset  . Hypertension Paternal Grandfather   . Stroke Paternal Grandfather   . Prostate cancer Paternal Grandfather   . Heart disease Paternal Grandfather   . Colon cancer Maternal Grandfather   . Bladder Cancer Maternal Grandfather   . Skin cancer Maternal Grandfather   . Kidney disease Maternal Grandmother     Social History   Social History  . Marital status: Married    Spouse name: Romelle Starcher  . Number of children: 2  . Years of education: N/A   Occupational History  . [employer]  . Animal nutritionist    Social History Main Topics  . Smoking status: Former Smoker    Types: Cigarettes    Quit date: 11/11/2013  . Smokeless tobacco: Current User    Types: Snuff  . Alcohol use No     Comment: No alcohol in 1 year  . Drug use: No  . Sexual activity: Not on file   Other Topics Concern  . Not on file   Social History Narrative   Works at [employer], Financial risk analyst. Works three  12 hour shifts- night shifts   2 children   Married            Review of systems: Review of Systems  Constitutional: Negative for fever and chills.  HENT: Negative.   Eyes: Negative for blurred vision.  Respiratory: Negative for cough, shortness of breath and wheezing.   Cardiovascular: Negative for chest pain and palpitations.  Gastrointestinal: as per HPI Genitourinary: Negative for dysuria, urgency, frequency and hematuria.  Musculoskeletal: Negative for myalgias, back pain and joint pain.  Skin: Negative for itching and rash.  Neurological: Negative for dizziness, tremors, focal weakness,  seizures and loss of consciousness.  Endo/Heme/Allergies: Positive for environmental allergies.  Psychiatric/Behavioral: Negative for depression, suicidal ideas and hallucinations.  All other systems reviewed and are negative.   Physical Exam: Vitals:   03/16/16 1031  BP: 112/80  Pulse: 84   Gen:      No acute distress HEENT:  EOMI, sclera anicteric Neck:     No masses; no thyromegaly Lungs:    Clear to auscultation bilaterally; normal respiratory effort CV:         Regular rate and rhythm; no murmurs Abd:      + bowel sounds; soft, non-tender; no palpable masses, no distension Ext:    No edema; adequate peripheral perfusion Skin:      Warm and dry; no rash Neuro: alert and oriented x 3 Psych: normal mood and affect Rectal exam: Normal anal sphincter tone, no anal fissure or external hemorrhoids Anoscopy: Medium size internal hemorrhoids in right anterior and in the left lateral, no active bleeding, normal dentate line, no visible nodules  Data Reviewed:  Reviewed chart in epic   Assessment and Plan/Recommendations:  35 year old male with family history of colon cancer here with complaints of intermittent bright red blood per rectum, most likely etiology of bleeding internal hemorrhoids but cannot exclude proctitis, inflammatory polyp, rectal ulcer or ano rectal cancer We'll schedule for colonoscopy for evaluation Advised patient to apply rectal hydrocortisone cream 1% small amount per rectum once or twice daily as needed After colonoscopy and if patient continues to have persistent intermittent bleeding and no other etiology other than internal hemorrhoids will consider hemorrhoidal band ligation  Return as needed Greater than 50% of the time used for counseling as well as treatment plan and follow-up. He had multiple questions which were answered to his satisfaction  K. Denzil Magnuson , MD [phone] Mon-Fri 8a-5p [phone] after 5p, weekends, holidays  CC: Debbrah Alar, NP

## 2016-03-22 ENCOUNTER — Encounter: Payer: Self-pay | Admitting: Gastroenterology

## 2016-04-05 ENCOUNTER — Encounter: Payer: BLUE CROSS/BLUE SHIELD | Admitting: Gastroenterology

## 2016-05-14 MED FILL — PARoxetine HCL 40 MG TABS: 40 | 90 days supply | Qty: 90 | Fill #1

## 2016-05-14 MED FILL — ATORVASTATIN 40 MG TABLET: 40 | 90 days supply | Qty: 90 | Fill #1

## 2016-07-16 ENCOUNTER — Encounter: Payer: Self-pay | Admitting: Family

## 2016-07-16 ENCOUNTER — Ambulatory Visit (INDEPENDENT_AMBULATORY_CARE_PROVIDER_SITE_OTHER): Payer: BLUE CROSS/BLUE SHIELD | Admitting: Family

## 2016-07-16 VITALS — BP 104/78 | HR 98 | Temp 98.0°F | Resp 16 | Ht 70.0 in | Wt 211.4 lb

## 2016-07-16 DIAGNOSIS — E663 Overweight: Secondary | ICD-10-CM | POA: Diagnosis not present

## 2016-07-16 DIAGNOSIS — F411 Generalized anxiety disorder: Secondary | ICD-10-CM | POA: Diagnosis not present

## 2016-07-16 MED ORDER — PAROXETINE HCL 40 MG PO TABS: 40.0000 mg | ORAL_TABLET | Freq: Every morning | ORAL | 1 refills | Status: DC

## 2016-07-16 NOTE — Assessment & Plan Note (Signed)
He has gained some weight. We discussed adding back in regular exercise to help with weight loss.

## 2016-07-16 NOTE — Progress Notes (Signed)
Pre visit review using our clinic review tool, if applicable. No additional management support is needed unless otherwise documented below in the visit note. 

## 2016-07-16 NOTE — Progress Notes (Signed)
Subjective:    Patient ID: Brandon BalJoseph Q Jensen, male    DOB: Jun 04, 1981, 35 y.o.   MRN: [id number]  HPI  Brandon Jensen is a 35 yr old male who presents today for follow up. He is maintained on paxil and prn xanax. Reports anxiety remains well controlled. Only uses xanax about once a months. Mood is good.    Wt Readings from Last 3 Encounters:  07/16/16 211 lb 6.4 oz (95.9 kg)  03/16/16 204 lb (92.5 kg)  01/16/16 206 lb (93.4 kg)   Reports that he declined colonoscopy.    Review of Systems See HPI  Past Medical History:  Diagnosis Date  . Allergy   . Anxiety   . GERD (gastroesophageal reflux disease)   . Hyperlipemia   . Obstructive sleep apnea 01/18/2011     Social History   Social History  . Marital status: Married    Spouse name: Toma CopierBethany  . Number of children: 2  . Years of education: N/A   Occupational History  . 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 Smoking status: Former Smoker    Types: Cigarettes    Quit date: 11/11/2013  . Smokeless tobacco: Current User    Types: Snuff  . Alcohol use No     Comment: No alcohol in 1 year  . Drug use: No  . Sexual activity: Not on file   Other Topics Concern  . 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 Works three 12 hour shifts- night shifts   2 children   Married          Past Surgical History:  Procedure Laterality Date  . INGUINAL HERNIA REPAIR      Family History  Problem Relation Age of Onset  . Hypertension Paternal Grandfather   . Stroke Paternal Grandfather   . Prostate cancer Paternal Grandfather   . Heart disease Paternal Grandfather   . Colon cancer Maternal Grandfather   . Bladder Cancer Maternal Grandfather   . Skin cancer Maternal Grandfather   . Kidney disease Maternal Grandmother     Allergies  Allergen Reactions  . Beef-Derived Products     Confirmed through allergy testing  . Corn-Containing  Products     Confirmed through allergy testing  . Eggs Or Egg-Derived Products     Confirmed through allergy testing  . Milk-Related Compounds     Confirmed through allergy testing  . Peanut-Containing Drug Products     Confirmed through allergy testing  . Red Dye Hives  . Wheat     Confirmed through allergy testing    Current Outpatient Prescriptions on File Prior to Visit  Medication Sig Dispense Refill  . ALPRAZolam (XANAX) 1 MG tablet One tab by mouth once daily as needed 12 tablet 0  . atorvastatin (LIPITOR) 40 MG tablet Take 1 tablet (40 mg total) by mouth daily. 90 tablet 1  . hydrocortisone (ANUSOL-HC) 2.5 % rectal cream Place 1 application rectally 2 (two) times daily. 30 g 1  . PARoxetine (PAXIL) 40 MG tablet Take 1 tablet (40 mg total) by mouth every morning. 90 tablet 1   No current facility-administered medications on file prior to visit.     BP 104/78 (BP Location: Left Arm, Cuff Size: Large)   Pulse 98   Temp 98 F (36.7 C) (Oral)   Resp 16   Ht 5\' 10"  (1.778 m)  Wt 211 lb 6.4 oz (95.9 kg)   SpO2 98% Comment: room air  BMI 30.33 kg/m       Objective:   Physical Exam  Constitutional: He is oriented to person, place, and time. He appears well-developed and well-nourished. No distress.  HENT:  Head: Normocephalic and atraumatic.  Cardiovascular: Normal rate and regular rhythm.   No murmur heard. Pulmonary/Chest: Effort normal and breath sounds normal. No respiratory distress. He has no wheezes. He has no rales.  Musculoskeletal: He exhibits no edema.  Neurological: He is alert and oriented to person, place, and time.  Skin: Skin is warm and dry.  Psychiatric: He has a normal mood and affect. His behavior is normal. Thought content normal.          Assessment & Plan:

## 2016-07-16 NOTE — Patient Instructions (Signed)
Continue current medications. Try to add back in regular exercise.

## 2016-07-16 NOTE — Assessment & Plan Note (Signed)
Stable on paxil, continue same. OK to continue rare use of xanax.

## 2016-09-07 ENCOUNTER — Other Ambulatory Visit: Payer: Self-pay | Admitting: Family

## 2016-09-07 MED FILL — PARoxetine HCL 40 MG TABS: 40 | 90 days supply | Qty: 90 | Fill #0

## 2016-09-07 MED FILL — ATORVASTATIN 40 MG TABLET: 40 | 90 days supply | Qty: 90 | Fill #0

## 2016-12-31 MED FILL — ATORVASTATIN 40 MG TABLET: 40 | 90 days supply | Qty: 90 | Fill #1

## 2016-12-31 MED FILL — PARoxetine HCL 40 MG TABS: 40 | 90 days supply | Qty: 90 | Fill #1

## 2017-01-28 ENCOUNTER — Telehealth: Payer: Self-pay | Admitting: Family

## 2017-01-28 ENCOUNTER — Encounter: Payer: Self-pay | Admitting: Family

## 2017-01-28 ENCOUNTER — Ambulatory Visit (INDEPENDENT_AMBULATORY_CARE_PROVIDER_SITE_OTHER): Payer: BLUE CROSS/BLUE SHIELD | Admitting: Family

## 2017-01-28 VITALS — BP 123/87 | HR 104 | Temp 99.4°F | Resp 16 | Ht 70.0 in | Wt 221.8 lb

## 2017-01-28 DIAGNOSIS — Z0000 Encounter for general adult medical examination without abnormal findings: Secondary | ICD-10-CM

## 2017-01-28 DIAGNOSIS — F411 Generalized anxiety disorder: Secondary | ICD-10-CM

## 2017-01-28 DIAGNOSIS — R7989 Other specified abnormal findings of blood chemistry: Secondary | ICD-10-CM

## 2017-01-28 DIAGNOSIS — G4733 Obstructive sleep apnea (adult) (pediatric): Secondary | ICD-10-CM | POA: Diagnosis not present

## 2017-01-28 DIAGNOSIS — R945 Abnormal results of liver function studies: Secondary | ICD-10-CM

## 2017-01-28 LAB — CBC WITH DIFFERENTIAL/PLATELET
BASOS ABS: 0 10*3/uL (ref 0.0–0.1)
Basophils Relative: 0.6 % (ref 0.0–3.0)
EOS PCT: 7.3 % — AB (ref 0.0–5.0)
Eosinophils Absolute: 0.5 10*3/uL (ref 0.0–0.7)
HEMATOCRIT: 42.3 % (ref 39.0–52.0)
Hemoglobin: 14.7 g/dL (ref 13.0–17.0)
Lymphocytes Relative: 35.5 % (ref 12.0–46.0)
Lymphs Abs: 2.7 10*3/uL (ref 0.7–4.0)
MCHC: 34.8 g/dL (ref 30.0–36.0)
MCV: 90.1 fl (ref 78.0–100.0)
MONOS PCT: 5.5 % (ref 3.0–12.0)
Monocytes Absolute: 0.4 10*3/uL (ref 0.1–1.0)
NEUTROS ABS: 3.8 10*3/uL (ref 1.4–7.7)
Neutrophils Relative %: 51.1 % (ref 43.0–77.0)
Platelets: 230 10*3/uL (ref 150.0–400.0)
RBC: 4.7 Mil/uL (ref 4.22–5.81)
RDW: 12.9 % (ref 11.5–15.5)
WBC: 7.5 10*3/uL (ref 4.0–10.5)

## 2017-01-28 LAB — BASIC METABOLIC PANEL
BUN: 14 mg/dL (ref 6–23)
CHLORIDE: 105 meq/L (ref 96–112)
CO2: 27 mEq/L (ref 19–32)
Calcium: 9.6 mg/dL (ref 8.4–10.5)
Creatinine, Ser: 1.05 mg/dL (ref 0.40–1.50)
GFR: 84.86 mL/min (ref >60.00)
GLUCOSE: 102 mg/dL — AB (ref 70–99)
POTASSIUM: 4 meq/L (ref 3.5–5.1)
SODIUM: 139 meq/L (ref 135–145)

## 2017-01-28 LAB — URINALYSIS, ROUTINE W REFLEX MICROSCOPIC
BILIRUBIN URINE: NEGATIVE
Hgb urine dipstick: NEGATIVE
KETONES UR: NEGATIVE
LEUKOCYTES UA: NEGATIVE
Nitrite: NEGATIVE
PH: 6 (ref 5.0–8.0)
RBC / HPF: NONE SEEN (ref 0–?)
Specific Gravity, Urine: <=1.005 — AB (ref 1.000–1.030)
TOTAL PROTEIN, URINE-UPE24: NEGATIVE
Urine Glucose: NEGATIVE
Urobilinogen, UA: 0.2 (ref 0.0–1.0)
WBC, UA: NONE SEEN (ref 0–?)

## 2017-01-28 LAB — HEPATIC FUNCTION PANEL
ALBUMIN: 4.6 g/dL (ref 3.5–5.2)
ALK PHOS: 66 U/L (ref 39–117)
ALT: 68 U/L — ABNORMAL HIGH (ref 0–53)
AST: 31 U/L (ref 0–37)
Bilirubin, Direct: 0.1 mg/dL (ref 0.0–0.3)
Total Bilirubin: 0.7 mg/dL (ref 0.2–1.2)
Total Protein: 7.1 g/dL (ref 6.0–8.3)

## 2017-01-28 LAB — LIPID PANEL
Cholesterol: 141 mg/dL (ref 0–200)
HDL: 34.2 mg/dL — ABNORMAL LOW (ref >39.00)
LDL Cholesterol: 72 mg/dL (ref 0–99)
NONHDL: 106.57
Total CHOL/HDL Ratio: 4
Triglycerides: 175 mg/dL — ABNORMAL HIGH (ref 0.0–149.0)
VLDL: 35 mg/dL (ref 0.0–40.0)

## 2017-01-28 LAB — TSH: TSH: 1.89 u[IU]/mL (ref 0.35–4.50)

## 2017-01-28 MED ORDER — ALPRAZOLAM 1 MG PO TABS: ORAL_TABLET | ORAL | 0 refills | Status: DC

## 2017-01-28 MED FILL — ALPRAZolam 1 MG TABS: 1 | 30 days supply | Qty: 30 | Fill #0

## 2017-01-28 NOTE — Assessment & Plan Note (Signed)
>>  ASSESSMENT AND PLAN FOR ROUTINE GENERAL MEDICAL EXAMINATION AT A HEALTH CARE FACILITY WRITTEN ON 01/28/2017  9:46 AM BY O'SULLIVAN, Camiyah Friberg, NP  Discussed healthy diet/exercise/weight loss.  Obtain routine lab work.  Immunizations reviewed and up to date.

## 2017-01-28 NOTE — Progress Notes (Signed)
Subjective:    Patient ID: Brandon Jensen, male    DOB: 18-Apr-1981, 36 y.o.   MRN: [id number]  HPI  Brandon Jensen is a 36 yr old male who presents today for follow up.  Patient presents today for complete physical.  Immunizations: tdap 2014 Diet: drinks only water, making healthy choices Exercise: stopped going to the gym.   Wt Readings from Last 3 Encounters:  01/28/17 221 lb 12.8 oz (100.6 kg)  07/16/16 211 lb 6.4 oz (95.9 kg)  03/16/16 204 lb (92.5 kg)  Dental:  Due for cleaning Vision: Due  Anxiety- uses xanax prn. Reports that he uses it 1-2 times a month. Continues paxil.  OSA- not using cpap, has equipment at home.   Review of Systems  Constitutional: Positive for unexpected weight change.  HENT: Negative for hearing loss and rhinorrhea.   Eyes: Negative for visual disturbance.  Respiratory: Negative for cough.   Cardiovascular: Negative for leg swelling.  Gastrointestinal: Negative for constipation and diarrhea.  Genitourinary: Negative for dysuria and frequency.  Musculoskeletal: Negative for arthralgias and myalgias.  Skin: Negative for rash.  Neurological: Negative for headaches.  Hematological: Negative for adenopathy.  Psychiatric/Behavioral:       Denies depression/anxiety symptoms   Past Medical History:  Diagnosis Date  . Allergy   . Anxiety   . GERD (gastroesophageal reflux disease)   . Hyperlipemia   . Obstructive sleep apnea 01/18/2011     Social History   Social History  . Marital status: Married    Spouse name: [name]  . Number of children: 2  . Years of education: N/A   Occupational History  . 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 Smoking status: Former Smoker    Types: Cigarettes    Quit date: 11/11/2013  . Smokeless tobacco: Current User    Types: Snuff  . Alcohol use No     Comment: No alcohol in 1 year  . Drug use: No  . Sexual activity: Not on file   Other Topics Concern  . 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 INGUINAL HERNIA REPAIR      Family History  Problem Relation Age of Onset  . Hypertension Paternal Grandfather   . Stroke Paternal Grandfather   . Prostate cancer Paternal Grandfather   . Heart disease Paternal Grandfather   . Colon cancer Maternal Grandfather   . Bladder Cancer Maternal Grandfather   . Skin cancer Maternal Grandfather   . Stroke Maternal Grandfather   . Kidney disease Maternal Grandmother     Allergies  Allergen Reactions  . Beef-Derived Products     Confirmed through allergy testing  . Corn-Containing Products     Confirmed through allergy testing  . Eggs Or Egg-Derived Products     Confirmed through allergy testing  . Milk-Related Compounds     Confirmed through allergy testing  . Peanut-Containing Drug Products     Confirmed through allergy testing  . Red Dye Hives  . Wheat     Confirmed through allergy testing    Current Outpatient Prescriptions on File Prior to Visit  Medication Sig Dispense Refill  . atorvastatin (LIPITOR) 40 MG tablet TAKE 1 TABLET (40 MG  TOTAL) BY MOUTH DAILY. 90 tablet 1  . PARoxetine (PAXIL) 40 MG tablet Take 1 tablet (40 mg total) by mouth every morning. 90 tablet 1   No current facility-administered medications on file prior to visit.     BP 123/87 (BP Location: Left Arm, Cuff Size: Large)   Pulse (!) 104   Temp 99.4 F (37.4 C) (Oral)   Resp 16   Ht 5\' 10"  (1.778 m)   Wt 221 lb 12.8 oz (100.6 kg)   SpO2 100%   BMI 31.82 kg/m       Objective:   Physical Exam  Physical Exam  Constitutional: Overweight appearing white male.  He is oriented to person, place, and time. He appears well-developed and well-nourished. No distress.  HENT:  Head: Normocephalic and atraumatic.  Right Ear: Tympanic  membrane and ear canal normal.  Left Ear: Tympanic membrane and ear canal normal.  Mouth/Throat: Oropharynx is clear and moist.  Eyes: Pupils are equal, round, and reactive to light. No scleral icterus.  Neck: Normal range of motion. No thyromegaly present.  Cardiovascular: Normal rate and regular rhythm.   No murmur heard. Pulmonary/Chest: Effort normal and breath sounds normal. No respiratory distress. He has no wheezes. He has no rales. He exhibits no tenderness.  Abdominal: Soft. Bowel sounds are normal. He exhibits no distension and no mass. There is no tenderness. There is no rebound and no guarding.  Musculoskeletal: He exhibits no edema.  Lymphadenopathy:    He has no cervical adenopathy.  Neurological: He is alert and oriented to person, place, and time. He has normal patellar reflexes. He exhibits normal muscle tone. Coordination normal.  Skin: Skin is warm and dry.  Psychiatric: He has a normal mood and affect. His behavior is normal. Judgment and thought content normal.           Assessment & Plan:         Assessment & Plan:

## 2017-01-28 NOTE — Assessment & Plan Note (Signed)
Stable. Using paxil and prn xanax. Pt will sign controlled substance contract today and perform UDS.

## 2017-01-28 NOTE — Assessment & Plan Note (Signed)
Discussed healthy diet/exercise/weight loss.  Obtain routine lab work.  Immunizations reviewed and up to date.

## 2017-01-28 NOTE — Patient Instructions (Addendum)
Please schedule eye exam and dental when you can. Try to work on regular exercise, healthy diet and weight loss.  Complete lab work prior to leaving.

## 2017-01-28 NOTE — Assessment & Plan Note (Signed)
Advised pt to resume CPAP.

## 2017-01-28 NOTE — Telephone Encounter (Signed)
Left pt a message to call back. 

## 2017-01-28 NOTE — Telephone Encounter (Signed)
One of his liver tests is elevated. Suspect fatty liver due to his recent weight gain. I would like him to complete additional pended tests and work on low fat/diet/exercise/weight loss.  Triglycerides are mildly elevated, please avoid concentrated sweets. Other lab work looks good.

## 2017-01-31 NOTE — Telephone Encounter (Signed)
Left message for pt to return my call.

## 2017-02-01 NOTE — Telephone Encounter (Signed)
Spoke to patient regarding lab results. Agreed to suggestions given by M. Osullivan.

## 2017-02-08 NOTE — Telephone Encounter (Signed)
Spoke with pt regarding additional lab tests and u/s. Pt states he wants to wait until he can verify u/s benefit with insurance before he schedules that (CPT code [id number] given to pt). Pt scheduled lab appt for 02/11/17 at 9am. Future orders signed.

## 2017-02-08 NOTE — Telephone Encounter (Signed)
Please mail him a letter if you are unable to get in touch with him. thanks

## 2017-02-11 ENCOUNTER — Other Ambulatory Visit: Payer: BLUE CROSS/BLUE SHIELD

## 2017-04-19 ENCOUNTER — Telehealth: Payer: Self-pay | Admitting: Family

## 2017-04-19 ENCOUNTER — Other Ambulatory Visit: Payer: Self-pay | Admitting: Family

## 2017-04-19 MED ORDER — PAROXETINE HCL 40 MG PO TABS: 40.0000 mg | ORAL_TABLET | Freq: Every morning | ORAL | 2 refills | Status: DC

## 2017-04-19 MED FILL — PARoxetine HCL 40 MG TABS: 40 | 30 days supply | Qty: 30 | Fill #0

## 2017-04-19 NOTE — Telephone Encounter (Signed)
Caller name: Relation to ZO:XWRUpt:self Call back number: [phone] [facility] [place]  Reason for call: pt is needing rx PARoxetine (PAXIL) 40 MG tablet, pt states he only need a 1 month supply right now due to just starting a new job and insurance is not in effect yet also pt states he is completely out of pills, would like a call back to make him aware rx was called in please leave a message

## 2017-04-19 NOTE — Telephone Encounter (Signed)
Rx sent, notified pt. 

## 2017-05-23 MED FILL — PARoxetine HCL 40 MG TABS: 40 | 30 days supply | Qty: 30 | Fill #1

## 2017-07-04 MED FILL — PARoxetine HCL 40 MG TABS: 40 | 30 days supply | Qty: 30 | Fill #2

## 2017-08-02 ENCOUNTER — Ambulatory Visit (INDEPENDENT_AMBULATORY_CARE_PROVIDER_SITE_OTHER): Payer: 59 | Admitting: Family

## 2017-08-02 ENCOUNTER — Telehealth: Payer: Self-pay | Admitting: Family

## 2017-08-02 ENCOUNTER — Encounter: Payer: Self-pay | Admitting: Family

## 2017-08-02 VITALS — BP 131/88 | HR 72 | Temp 99.3°F | Resp 16 | Ht 70.0 in | Wt 230.0 lb

## 2017-08-02 DIAGNOSIS — E782 Mixed hyperlipidemia: Secondary | ICD-10-CM

## 2017-08-02 DIAGNOSIS — G4733 Obstructive sleep apnea (adult) (pediatric): Secondary | ICD-10-CM

## 2017-08-02 DIAGNOSIS — F411 Generalized anxiety disorder: Secondary | ICD-10-CM

## 2017-08-02 DIAGNOSIS — R1310 Dysphagia, unspecified: Secondary | ICD-10-CM

## 2017-08-02 MED ORDER — ATORVASTATIN CALCIUM 40 MG PO TABS
40.0000 mg | ORAL_TABLET | Freq: Every day | ORAL | 1 refills | Status: DC
Start: 2017-08-02 — End: 2018-02-01

## 2017-08-02 MED ORDER — PANTOPRAZOLE SODIUM 40 MG PO TBEC: 40.0000 mg | DELAYED_RELEASE_TABLET | Freq: Every day | ORAL | 3 refills | Status: DC

## 2017-08-02 MED ORDER — PAROXETINE HCL 40 MG PO TABS: 40.0000 mg | ORAL_TABLET | Freq: Every morning | ORAL | 1 refills | Status: DC

## 2017-08-02 MED FILL — PARoxetine HCL 40 MG TABS: 40 | 90 days supply | Qty: 90 | Fill #0

## 2017-08-02 MED FILL — ATORVASTATIN 40 MG TABLET: 40 | 90 days supply | Qty: 90 | Fill #0

## 2017-08-02 MED FILL — PANTOPRAZOLE SOD DR 40 MG T: 40 | 30 days supply | Qty: 30 | Fill #0

## 2017-08-02 NOTE — Progress Notes (Signed)
Subjective:    Patient ID: Brandon Jensen, male    DOB: 05/03/1981, 36 y.o.   MRN: [id number]  HPI  Mr. Brandon Jensen is a 36 yr old male who presents today for follow up.  1) Hyperlipidemia- maintained on lipitor. Ran out of statin a few months ago.  Lab Results  Component Value Date   CHOL 141 01/28/2017   HDL 34.20 (L) 01/28/2017   LDLCALC 72 01/28/2017   TRIG 175.0 (H) 01/28/2017   CHOLHDL 4 01/28/2017   2) Anxiety- maintained on paxil and prn xanax. Reports anxiety symtoms are stable.  Lab Results  Component Value Date   CHOL 141 01/28/2017   HDL 34.20 (L) 01/28/2017   LDLCALC 72 01/28/2017   TRIG 175.0 (H) 01/28/2017   CHOLHDL 4 01/28/2017   Wants to restart cpap- has an old machine.  Reports extreme fatigue during the day, snoring.   Reports dysphagia- reports that this has been occurring x 2 years but last Sunday he got choked on Chicken.  Feels like his esophagus is tightened.    Wt Readings from Last 3 Encounters:  08/02/17 230 lb (104.3 kg)  01/28/17 221 lb 12.8 oz (100.6 kg)  07/16/16 211 lb 6.4 oz (95.9 kg)      Review of Systems Past Medical History:  Diagnosis Date  . Allergy   . Anxiety   . GERD (gastroesophageal reflux disease)   . Hyperlipemia   . Obstructive sleep apnea 01/18/2011     Social History   Socioeconomic History  . Marital status: Married    Spouse name: Brandon Jensen  . Number of children: 2  . Years of education: Not on file  . Highest education level: Not on file  Social Needs  . Financial resource strain: Not on file  . Food insecurity - worry: Not on file  . Food insecurity - inability: Not on file  . Transportation needs - medical: Not on file  . Transportation needs - non-medical: Not on file  Occupational History  . 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 Occupation: Engineer, materialssecurity officer  Tobacco Use  . Smoking status: Former Smoker    Types: Cigarettes    Last attempt to quit: 11/11/2013    Years since  quitting: 3.7  . Smokeless tobacco: Current User    Types: Snuff  Substance and Sexual Activity  . Alcohol use: No    Alcohol/week: 0.0 oz    Comment: No alcohol in 1 year  . Drug use: No  . Sexual activity: Not on file  Other Topics Concern  . 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 Works three 12 hour shifts- night shifts   2 children   Married       Past Surgical History:  Procedure Laterality Date  . INGUINAL HERNIA REPAIR      Family History  Problem Relation Age of Onset  . Hypertension Paternal Grandfather   . Stroke Paternal Grandfather   . Prostate cancer Paternal Grandfather   . Heart disease Paternal Grandfather   . Colon cancer Maternal Grandfather   . Bladder Cancer Maternal Grandfather   . Skin cancer Maternal Grandfather   . Stroke Maternal Grandfather   . Kidney disease Maternal Grandmother     Allergies  Allergen Reactions  . Beef-Derived Products     Confirmed through allergy testing  . Corn-Containing Products     Confirmed through allergy testing  . Eggs Or  Egg-Derived Products     Confirmed through allergy testing  . Milk-Related Compounds     Confirmed through allergy testing  . Peanut-Containing Drug Products     Confirmed through allergy testing  . Red Dye Hives  . Wheat     Confirmed through allergy testing    Current Outpatient Medications on File Prior to Visit  Medication Sig Dispense Refill  . ALPRAZolam (XANAX) 1 MG tablet One tab by mouth once daily as needed 30 tablet 0   No current facility-administered medications on file prior to visit.     BP 131/88 (BP Location: Left Arm, Cuff Size: Large)   Pulse 72   Temp 99.3 F (37.4 C) (Oral)   Resp 16   Ht 5\' 10"  (1.778 m)   Wt 230 lb (104.3 kg)   SpO2 98%   BMI 33.00 kg/m       Objective:   Physical Exam  Constitutional: He is oriented to person, place, and time. He appears well-developed and well-nourished. No distress.    HENT:  Head: Normocephalic and atraumatic.  Cardiovascular: Normal rate and regular rhythm.  No murmur heard. Pulmonary/Chest: Effort normal and breath sounds normal. No respiratory distress. He has no wheezes. He has no rales.  Musculoskeletal: He exhibits no edema.  Neurological: He is alert and oriented to person, place, and time.  Skin: Skin is warm and dry.  Psychiatric: He has a normal mood and affect. His behavior is normal. Thought content normal.          Assessment & Plan:   GERD- start PPI  Dysphagia- severe. PPI should help some but will ultimately need endo and ? Dilation. Will refer to GI for further evaluation. Advised pt to chew food carefully and take small bites with water in the meantime.

## 2017-08-02 NOTE — Assessment & Plan Note (Signed)
Restart statin, discussed diet/exercise/weight loss.

## 2017-08-02 NOTE — Assessment & Plan Note (Signed)
Wants to restart cpap, needs new machine, will place order.

## 2017-08-02 NOTE — Patient Instructions (Signed)
Begin protonix for reflux. You should be contacted about your referral to GI and for CPAP.

## 2017-08-02 NOTE — Telephone Encounter (Signed)
Please contact patient and let him know that his insurance will require a new sleep study prior to ordering CPAP.  I have placed the order for a home sleep study.  He should be contacted about scheduling this.

## 2017-08-02 NOTE — Assessment & Plan Note (Signed)
Stable on current meds.  Continue same. 

## 2017-08-03 NOTE — Telephone Encounter (Signed)
Left detailed message on voicemail re: below requirement and to call if any questions or if he hasn't been contacted within 1 week about setting up home sleep study.

## 2017-08-05 ENCOUNTER — Ambulatory Visit: Payer: 59 | Admitting: Physician Assistant

## 2017-08-05 ENCOUNTER — Encounter: Payer: Self-pay | Admitting: Physician Assistant

## 2017-08-05 VITALS — BP 124/82 | HR 105 | Ht 70.0 in | Wt 230.0 lb

## 2017-08-05 DIAGNOSIS — K219 Gastro-esophageal reflux disease without esophagitis: Secondary | ICD-10-CM | POA: Diagnosis not present

## 2017-08-05 DIAGNOSIS — R1310 Dysphagia, unspecified: Secondary | ICD-10-CM | POA: Diagnosis not present

## 2017-08-05 NOTE — Progress Notes (Signed)
Reviewed and agree with documentation and assessment and plan. K. Veena Rayshawn Visconti , MD   

## 2017-08-05 NOTE — Patient Instructions (Signed)
Continue Pantoprazole 40 mg daily 30-60 minutes before breakfast and dinner.   We have given you a GERD handout.

## 2017-08-05 NOTE — Progress Notes (Signed)
Chief Complaint: Dysphagia, GERD  HPI:    Brandon Jensen is a 36 year old Caucasian male with a past medical history as listed below, who was referred to me by [name], NP for a complaint of dysphagia and GERD.    Patient was previously seen in our clinic by Dr. Lavon Paganini for bright red blood per rectum.  He was advised to apply hydrocortisone cream 1% small amount per rectum once or twice daily as needed.  He was also recommended to have a colonoscopy which he never scheduled.     Today, the patient presents to clinic accompanied by his 2 young daughters.  He explains that he has had several episodes where food gets hung in his throat over the past couple of years.  This has been so severe on 5 different occasions that he has required the Heimlich maneuver because he could not breathe.  The patient tells me he has noted that it is "harder and harder to swallow things" over the years.  Patient tells me he has more trouble with meats and breads.  Patient was recently started on Pantoprazole 40 mg once daily and has only taken this for 2 days.  He does tell me that he continues with reflux and heartburn symptoms on an almost daily basis.  He has seen no change of the Pantoprazole so far.    Patient denies any childhood asthma or food allergies other than red dye.    Patient denies fever, chills, blood in stool, melena, weight loss, anorexia, nausea, vomiting, change in bowel habits, abdominal pain or symptoms that awaken him at night.  Past Medical History:  Diagnosis Date  . Allergy   . Anxiety   . GERD (gastroesophageal reflux disease)   . Hyperlipemia   . Obstructive sleep apnea 01/18/2011    Past Surgical History:  Procedure Laterality Date  . INGUINAL HERNIA REPAIR      Current Outpatient Medications  Medication Sig Dispense Refill  . ALPRAZolam (XANAX) 1 MG tablet One tab by mouth once daily as needed 30 tablet 0  . atorvastatin (LIPITOR) 40 MG tablet Take 1 tablet (40 mg  total) by mouth daily. 90 tablet 1  . pantoprazole (PROTONIX) 40 MG tablet Take 1 tablet (40 mg total) by mouth daily. 30 tablet 3  . PARoxetine (PAXIL) 40 MG tablet Take 1 tablet (40 mg total) by mouth every morning. 90 tablet 1   No current facility-administered medications for this visit.     Allergies as of 08/05/2017 - Review Complete 08/02/2017  Allergen Reaction Noted  . Beef-derived products  06/09/2011  . Corn-containing products  06/09/2011  . Eggs or egg-derived products  06/09/2011  . Milk-related compounds  06/09/2011  . Peanut-containing drug products  06/09/2011  . Red dye Hives 05/27/2011  . Wheat  06/09/2011    Family History  Problem Relation Age of Onset  . Hypertension Paternal Grandfather   . Stroke Paternal Grandfather   . Prostate cancer Paternal Grandfather   . Heart disease Paternal Grandfather   . Colon cancer Maternal Grandfather 60  . Bladder Cancer Maternal Grandfather   . Skin cancer Maternal Grandfather   . Stroke Maternal Grandfather   . Kidney disease Maternal Grandmother   . Stomach cancer Neg Hx     Social History   Socioeconomic History  . Marital status: Married    Spouse name: [name]  . Number of children: 2  . Years of education: Not on file  . Highest education level: Not  on file  Social Needs  . Financial resource strain: Not on file  . Food insecurity - worry: Not on file  . Food insecurity - inability: Not on file  . Transportation needs - medical: Not on file  . Transportation needs - non-medical: Not on file  Occupational History  . 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 Occupation: Engineer, materialssecurity officer  Tobacco Use  . Smoking status: Former Smoker    Types: Cigarettes    Last attempt to quit: 11/11/2013    Years since quitting: 3.7  . Smokeless tobacco: Current User    Types: Snuff  Substance and Sexual Activity  . Alcohol use: No    Alcohol/week: 0.0 oz    Comment: No alcohol in 1 year  . Drug use:  No  . Sexual activity: Yes    Partners: Female  Other Topics Concern  . 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 Works three 12 hour shifts- night shifts   2 children   Married       Review of Systems:    Constitutional: No weight loss, fever or chills Skin: No rash  Cardiovascular: No chest pain Respiratory: No SOB  Gastrointestinal: See HPI and otherwise negative Genitourinary: No dysuria  Neurological: No headache, dizziness or syncope Musculoskeletal: No new muscle or joint pain Hematologic: No bleeding  Psychiatric: No history of depression or anxiety   Physical Exam:  Vital signs: BP 124/82   Pulse (!) 105   Ht 5\' 10"  (1.778 m)   Wt 230 lb (104.3 kg)   BMI 33.00 kg/m   Constitutional:   Pleasant overweight Caucasian male appears to be in NAD, Well developed, Well nourished, alert and cooperative Head:  Normocephalic and atraumatic. Eyes:   PEERL, EOMI. No icterus. Conjunctiva pink. Ears:  Normal auditory acuity. Neck:  Supple Throat: Oral cavity and pharynx without inflammation, swelling or lesion.  Respiratory: Respirations even and unlabored. Lungs clear to auscultation bilaterally.   No wheezes, crackles, or rhonchi.  Cardiovascular: Normal S1, S2. No MRG. Regular rate and rhythm. No peripheral edema, cyanosis or pallor.  Gastrointestinal:  Soft, nondistended, nontender. No rebound or guarding. Normal bowel sounds. No appreciable masses or hepatomegaly. Rectal:  Not performed.  Msk:  Symmetrical without gross deformities. Without edema, no deformity or joint abnormality.  Neurologic:  Alert and  oriented x4;  grossly normal neurologically.  Skin:   Dry and intact without significant lesions or rashes. Psychiatric:  Demonstrates good judgement and reason without abnormal affect or behaviors.  MOST RECENT LABS AND IMAGING: CBC    Component Value Date/Time   WBC 7.5 01/28/2017 0953   RBC 4.70 01/28/2017 0953   HGB  14.7 01/28/2017 0953   HCT 42.3 01/28/2017 0953   PLT 230.0 01/28/2017 0953   MCV 90.1 01/28/2017 0953   MCH 31.5 09/27/2012 1035   MCHC 34.8 01/28/2017 0953   RDW 12.9 01/28/2017 0953   LYMPHSABS 2.7 01/28/2017 0953   MONOABS 0.4 01/28/2017 0953   EOSABS 0.5 01/28/2017 0953   BASOSABS 0.0 01/28/2017 0953    CMP     Component Value Date/Time   NA 139 01/28/2017 0953   K 4.0 01/28/2017 0953   CL 105 01/28/2017 0953   CO2 27 01/28/2017 0953   GLUCOSE 102 (H) 01/28/2017 0953   BUN 14 01/28/2017 0953   CREATININE 1.05 01/28/2017 0953   CREATININE 1.10 09/27/2012 1035   CALCIUM 9.6 01/28/2017 0953  PROT 7.1 01/28/2017 0953   ALBUMIN 4.6 01/28/2017 0953   AST 31 01/28/2017 0953   ALT 68 (H) 01/28/2017 0953   ALKPHOS 66 01/28/2017 0953   BILITOT 0.7 01/28/2017 0953   GFRNONAA 89 09/27/2012 1035   GFRAA >89 09/27/2012 1035    Assessment: 1.  Dysphagia: Over the past "couple of years", has required the Heimlich on 5 separate occasions; consider most likely esophageal stricture versus ring versus web versus mass  2.  GERD: Constant per the patient, no change with Pantoprazole 40 mg after 2 days  Plan: 1.  Discussed with the patient today that the best diagnostic and therapeutic procedure is an EGD with dilation.  The patient verbalized understanding but is also questioning cost with his insurance as this is "not very good".  The patient declined to schedule this procedure today for fear of cost.  He tells me he will contact his insurance company and figure out if he can afford this. 2.  We also discussed a barium swallow today.  Patient would also like to wait on this as he is unsure what this will cost him. 3.  Reviewed anti-dysphagia measures including taking small bites, chewing well, avoiding distraction while eating and the chin tuck technique. 4.  Recommend the patient continue his Pantoprazole 40 mg once daily, 30-60 minutes before eating breakfast. 5.  Reviewed antireflux  diet and lifestyle modifications and provided patient with a handout. 6.  Patient to follow in clinic with us after contacting his insurance company.  Did discuss the risk of acute food impaction and emergent EGD.  Patient is aware.  Hyacinth MeekerJennifer Nyimah Shadduck, PA-C [facility] 08/05/2017, 9:38 AM  Cc: [name]'Sullivan, Melissa, NP

## 2017-08-23 ENCOUNTER — Encounter (HOSPITAL_COMMUNITY): Payer: Self-pay

## 2017-08-23 ENCOUNTER — Ambulatory Visit (HOSPITAL_COMMUNITY): Admit: 2017-08-23 | Payer: 59 | Admitting: Gastroenterology

## 2017-08-23 SURGERY — COLONOSCOPY WITH PROPOFOL
Anesthesia: Monitor Anesthesia Care

## 2017-09-02 MED FILL — PANTOPRAZOLE SOD DR 40 MG T: 40 | 30 days supply | Qty: 30 | Fill #1

## 2017-09-10 DIAGNOSIS — G4733 Obstructive sleep apnea (adult) (pediatric): Secondary | ICD-10-CM | POA: Diagnosis not present

## 2017-09-24 DIAGNOSIS — G4733 Obstructive sleep apnea (adult) (pediatric): Secondary | ICD-10-CM | POA: Diagnosis not present

## 2017-09-25 ENCOUNTER — Telehealth: Payer: Self-pay | Admitting: Family

## 2017-09-25 DIAGNOSIS — G4733 Obstructive sleep apnea (adult) (pediatric): Secondary | ICD-10-CM

## 2017-09-25 NOTE — Telephone Encounter (Signed)
Please contact pt and let him know that sleep study again demonstrates sleep apnea.  I would recommend that he restart cpap and I will place order for home health to bring him a new machine. I would like him to follow up with me in the office 1 month after restarting cpap.

## 2017-09-25 NOTE — Telephone Encounter (Signed)
-----   Message from Coralyn HellingVineet Sood, MD sent at 09/24/2017  2:16 PM EST ----- Rafeal Skibicki,  Home sleep study 09/10/17 >> AHI 28.6, SaO2 low 80%.  Moderate obstructive sleep apnea.   Thanks.  Vineet

## 2017-09-26 NOTE — Telephone Encounter (Deleted)
CPAP order entered/ printed/ signed and faxed to [facility] at [phone].

## 2017-09-27 NOTE — Telephone Encounter (Signed)
Order is being held for actual sleep study report. Spoke with Tresa EndoKelly at pulmonology office and she will try to print report and fax it to fax at nurses station. Awaiting report then can fax report and CPAP order to [facility] at [phone].

## 2017-09-27 NOTE — Telephone Encounter (Signed)
Called and spoke with Bethann Berkshirerisha at [facility] regarding sleep study Printed the study, RA signed and reviewed the study Faxing over the study to [place] phone [phone] fax [phone]] Nothing further needed

## 2017-09-27 NOTE — Telephone Encounter (Signed)
Information received and faxed to [facility]. Left detailed message on pt's voicemail to call and schedule f/u with PCP 1 month after restarting CPAP.

## 2017-09-29 ENCOUNTER — Telehealth: Payer: Self-pay | Admitting: *Deleted

## 2017-10-04 NOTE — Telephone Encounter (Signed)
[facility]; forwarded to provider/SLS

## 2017-10-05 ENCOUNTER — Telehealth: Payer: Self-pay | Admitting: Family

## 2017-10-05 DIAGNOSIS — G4730 Sleep apnea, unspecified: Secondary | ICD-10-CM

## 2017-10-05 MED FILL — PANTOPRAZOLE SOD DR 40 MG T: 40 | 30 days supply | Qty: 30 | Fill #2

## 2017-10-05 NOTE — Telephone Encounter (Signed)
Spoke with pt- Using [facility]. New machine is too costly.  Wants to restart cpap using old machine which does not have autopap. See handwritten order for cpap 8 cmH20 download in 2-4 weeks. Please fax to [facility] at pt request.

## 2017-10-05 NOTE — Telephone Encounter (Signed)
Copied from CRM [id number]. Topic: General - Other >> Oct 05, 2017 12:31 PM Stephannie LiSimmons, Ehsan Corvin L, NT wrote: Reason for UEA:[id number]:Patient called and said he is still using his old c pap he wants to keep his old one ,he needs the  order changed back to his old one he needs the order changed to a set pressure for the c pap his is set for 8 the old one has a sim card ,and does not have the auto pressure change, but the pressure can be manually changed , please advise [phone]

## 2017-10-06 NOTE — Telephone Encounter (Signed)
Brandon CrazeMelissa Jensen has spoken with patient regarding instructions.

## 2017-10-06 NOTE — Telephone Encounter (Signed)
I received order from [facility] and I have changed settings to reflect 8cm H20.  Please let pt know that I have also finally received a formal copy of his most recent sleep study report. They report "severe OSA". They are suggesting a cpap titration study. I would recommend that he start the cpap at 8 for now while we work on getting cpap titration study ordered.  This will help us determine best current setting to treat his osa.

## 2017-10-11 ENCOUNTER — Telehealth: Payer: Self-pay | Admitting: Family

## 2017-10-11 DIAGNOSIS — G4733 Obstructive sleep apnea (adult) (pediatric): Secondary | ICD-10-CM

## 2017-10-11 NOTE — Telephone Encounter (Signed)
Copied from CRM [id number]. Topic: Quick Communication - See Telephone Encounter >> Oct 11, 2017  9:21 AM Louie BunPalacios Medina, Rosey Batheresa D wrote: CRM for notification. See Telephone encounter for: 10/11/17. Aurther Lofterry from [facility] called and was calling to let the provider know that the order was denied by patients insurance. She can be reached at [phone].

## 2017-10-11 NOTE — Telephone Encounter (Signed)
Spoke with Rosey Batheresa at [facility] and she said a peer to peer review could be done in the next 21 days at [phone], claim # [id number].

## 2017-10-11 NOTE — Telephone Encounter (Signed)
Could you please call sleep center and ask if they need a peer to peer or if they have any additional information on the denial?

## 2017-10-11 NOTE — Telephone Encounter (Signed)
FYI

## 2017-10-18 NOTE — Telephone Encounter (Signed)
[phone]- peer to peer number. MD unavailable. Appointment made for peer to peer.   9:40 on 3/12  [phone], fax.

## 2017-10-19 NOTE — Telephone Encounter (Signed)
Please contact patient and let him know that I did speak to his insurance company.  They have denied the in lab titration sleep study.  Medical director at his insurance tells me that they will cover a 7321-month rental of an AutoPap device.  He will need to come back to see me 1 month after starting the AutoPap.  If the download shows that he is using the device and that it is helping him his insurance will cover continuation of the CPAP machine.  I have replaced a referral to home health to bring him an AutoPap device.  Please call me if he has any issues with cost or in receiving the machine.  Also please call me if he is not heard back within 1-2 weeks about this device.

## 2017-10-24 NOTE — Telephone Encounter (Signed)
Notified pt and advised him to be expecting a call from [facility]. Pt states if insurance will not cover the auto CPAP he cannot afford to purchase it. I will follow up with [facility].

## 2017-10-24 NOTE — Telephone Encounter (Signed)
Spoke with Amy at [facility]. She will look into this further and call me back to confirm they have what they need and can proceed with order.

## 2017-10-26 NOTE — Telephone Encounter (Signed)
Spoke with [facility]yn [facility]manoni at [facility] and she reviewed notes by Amy. [facility]. Sheralyn Boatmanoni is sending message to Amy to follow up with me about the outcome. Awaiting return call.

## 2017-11-02 NOTE — Telephone Encounter (Signed)
After further reviewing record I see a phone note from 10/05/17 that says pt has been using [facility]. Sp[facility] with Pam at [facility]. She states pt may have a deductible or 80/20 plan and his portion for auto titrating CPAP machine would be $300. They discussed that with the pt and he declined a new machine (cancelled payment plan) and said he would just get supplies. She states pt was shipped mask, tubing and headgear on 10/05/17. Verified this with pt. He states he is unable to use these supplies with his old machine. Advised him to contact [facility] to discuss payment plan options for auto titrating CPAP.

## 2017-11-02 NOTE — Telephone Encounter (Signed)
Noted and agree. 

## 2017-11-07 MED FILL — ATORVASTATIN 40 MG TABLET: 40 | 90 days supply | Qty: 90 | Fill #1

## 2017-11-07 MED FILL — PANTOPRAZOLE SOD DR 40 MG T: 40 | 30 days supply | Qty: 30 | Fill #3

## 2017-11-07 MED FILL — PARoxetine HCL 40 MG TABS: 40 | 90 days supply | Qty: 90 | Fill #1

## 2017-12-12 ENCOUNTER — Other Ambulatory Visit: Payer: Self-pay | Admitting: Family

## 2017-12-12 ENCOUNTER — Telehealth: Payer: Self-pay | Admitting: Family

## 2017-12-12 MED FILL — PANTOPRAZOLE SOD DR 40 MG T: 40 | 30 days supply | Qty: 30 | Fill #0

## 2017-12-12 NOTE — Telephone Encounter (Signed)
-----   Message from Lilian Kapur sent at 12/12/2017  3:42 PM EDT ----- Regarding: HST I first spoke with Brandon Jensen on 11/01/2017 and he stated that he wanted to contact his insurance about whether they covered the HST or not and would call me back. I left a message on 11/28/2017 and called again today stating that if he would just call me back about whether or not he was still interested in doing the HST. He called me right back and stated that his insurance would not cover the HST and that he didn't want to do it at this time  Thanks, Synetta Fail

## 2017-12-27 ENCOUNTER — Telehealth: Payer: Self-pay | Admitting: Family

## 2017-12-27 ENCOUNTER — Encounter: Payer: Self-pay | Admitting: Family

## 2017-12-27 NOTE — Telephone Encounter (Signed)
Please see letter.

## 2017-12-27 NOTE — Telephone Encounter (Signed)
Copied from CRM [id number]. Topic: Quick Communication - See Telephone Encounter >> Dec 27, 2017  3:54 PM Eston Mould B wrote: CRM for notification. See Telephone encounter for: 12/27/17.PT is asking for a call from [facility] or her nurse  he states its personal  [phone]

## 2017-12-27 NOTE — Telephone Encounter (Signed)
Spoke with patient. He received letter for jury duty. Pt states he is unable to due this because of his high anxiety. States he is unable to sit in a crowded theater without taking Xanax. He is requesting letter to excuse him from [place] for this reason and it must state why he would be unable to serve.  Also states pantoprazole has been working very well to control his GERD symptoms, "better than anything he has taken in the past".

## 2017-12-28 NOTE — Telephone Encounter (Signed)
Letter printed and placed at front desk for pick up. Detailed message left on pt's voicemail and to call if any questions.

## 2018-01-16 MED FILL — PANTOPRAZOLE SOD DR 40 MG T: 40 | 30 days supply | Qty: 30 | Fill #1

## 2018-02-01 ENCOUNTER — Encounter: Payer: Self-pay | Admitting: Family

## 2018-02-01 ENCOUNTER — Ambulatory Visit (INDEPENDENT_AMBULATORY_CARE_PROVIDER_SITE_OTHER): Payer: 59 | Admitting: Family

## 2018-02-01 VITALS — BP 138/98 | HR 71 | Temp 99.1°F | Resp 16 | Ht 70.0 in | Wt 216.4 lb

## 2018-02-01 DIAGNOSIS — E785 Hyperlipidemia, unspecified: Secondary | ICD-10-CM | POA: Diagnosis not present

## 2018-02-01 DIAGNOSIS — I10 Essential (primary) hypertension: Secondary | ICD-10-CM | POA: Diagnosis not present

## 2018-02-01 DIAGNOSIS — F411 Generalized anxiety disorder: Secondary | ICD-10-CM | POA: Diagnosis not present

## 2018-02-01 DIAGNOSIS — R030 Elevated blood-pressure reading, without diagnosis of hypertension: Secondary | ICD-10-CM | POA: Diagnosis not present

## 2018-02-01 DIAGNOSIS — K219 Gastro-esophageal reflux disease without esophagitis: Secondary | ICD-10-CM

## 2018-02-01 DIAGNOSIS — Z79899 Other long term (current) drug therapy: Secondary | ICD-10-CM

## 2018-02-01 LAB — LIPID PANEL
Cholesterol: 138 mg/dL (ref 0–200)
HDL: 37 mg/dL — ABNORMAL LOW (ref >39.00)
NONHDL: 101.31
Total CHOL/HDL Ratio: 4
Triglycerides: 228 mg/dL — ABNORMAL HIGH (ref 0.0–149.0)
VLDL: 45.6 mg/dL — ABNORMAL HIGH (ref 0.0–40.0)

## 2018-02-01 LAB — COMPREHENSIVE METABOLIC PANEL
ALK PHOS: 72 U/L (ref 39–117)
ALT: 34 U/L (ref 0–53)
AST: 23 U/L (ref 0–37)
Albumin: 4.7 g/dL (ref 3.5–5.2)
BILIRUBIN TOTAL: 0.7 mg/dL (ref 0.2–1.2)
BUN: 14 mg/dL (ref 6–23)
CO2: 29 meq/L (ref 19–32)
Calcium: 9.9 mg/dL (ref 8.4–10.5)
Chloride: 102 mEq/L (ref 96–112)
Creatinine, Ser: 1.15 mg/dL (ref 0.40–1.50)
GFR: 75.97 mL/min (ref >60.00)
GLUCOSE: 101 mg/dL — AB (ref 70–99)
Potassium: 4.2 mEq/L (ref 3.5–5.1)
SODIUM: 139 meq/L (ref 135–145)
TOTAL PROTEIN: 7.1 g/dL (ref 6.0–8.3)

## 2018-02-01 LAB — LDL CHOLESTEROL, DIRECT: LDL DIRECT: 67 mg/dL

## 2018-02-01 MED ORDER — PANTOPRAZOLE SODIUM 40 MG PO TBEC: 40.0000 mg | DELAYED_RELEASE_TABLET | Freq: Every day | ORAL | 1 refills | Status: DC

## 2018-02-01 MED ORDER — ALPRAZOLAM 1 MG PO TABS: ORAL_TABLET | ORAL | 0 refills | Status: DC

## 2018-02-01 MED ORDER — ATORVASTATIN CALCIUM 40 MG PO TABS: 40.0000 mg | ORAL_TABLET | Freq: Every day | ORAL | 1 refills | Status: DC

## 2018-02-01 NOTE — Progress Notes (Signed)
Subjective:    Patient ID: Brandon Jensen, male    DOB: Oct 18, 1980, 37 y.o.   MRN: [id number]  HPI  Brandon Jensen is a 37 yr old male who presents today for follow up.  Hyperlipidemia-he is maintained on statin. Lab Results  Component Value Date   CHOL 141 01/28/2017   HDL 34.20 (L) 01/28/2017   LDLCALC 72 01/28/2017   TRIG 175.0 (H) 01/28/2017   CHOLHDL 4 01/28/2017   Anxiety- continues paxil and as needed xanax.  Uses xanax very sporadically.  He reports that he has been having a lot of stress lately.  His wife has chosen to leave the family.   OSA-last visit we placed an order for a new cpap machine.   Wt Readings from Last 3 Encounters:  02/01/18 216 lb 6.4 oz (98.2 kg)  08/05/17 230 lb (104.3 kg)  08/02/17 230 lb (104.3 kg)  started using a treadmill in April.    Dysphagia- had EGD scheduled but cancelled. We did give him a trial of protonix last visit for his gerd symptoms.  Reports significant improvement in his gerd symptoms.   He has concern about a bump on his testicle.   Review of Systems See HPI  Past Medical History:  Diagnosis Date  . Allergy   . Anxiety   . GERD (gastroesophageal reflux disease)   . Hyperlipemia   . Obstructive sleep apnea 01/18/2011     Social History   Socioeconomic History  . Marital status: Married    Spouse name: [name]  . Number of children: 2  . Years of education: Not on file  . Highest education level: Not on file  Occupational History  . 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: [employer]  . Occupation: Engineer, materials  Social Needs  . Financial resource strain: Not on file  . Food insecurity:    Worry: Not on file    Inability: Not on file  . Transportation needs:    Medical: Not on file    Non-medical: Not on file  Tobacco Use  . Smoking status: Former Smoker    Types: Cigarettes    Last attempt to quit: 11/11/2013    Years since quitting: 4.2  . Smokeless tobacco: Former Neurosurgeon    Types: Snuff  . Tobacco  comment: stopped using snuff in 11/2017  Substance and Sexual Activity  . Alcohol use: No    Alcohol/week: 0.0 oz    Comment: No alcohol in 1 year  . Drug use: No  . Sexual activity: Yes    Partners: Female  Lifestyle  . Physical activity:    Days per week: Not on file    Minutes per session: Not on file  . Stress: Not on file  Relationships  . Social connections:    Talks on phone: Not on file    Gets together: Not on file    Attends religious service: Not on file    Active member of club or organization: Not on file    Attends meetings of clubs or organizations: Not on file    Relationship status: Not on file  . Intimate partner violence:    Fear of current or ex partner: Not on file    Emotionally abused: Not on file    Physically abused: Not on file    Forced sexual activity: Not on file  Other Topics Concern  . 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 INGUINAL HERNIA REPAIR      Family History  Problem Relation Age of Onset  . Hypertension Paternal Grandfather   . Stroke Paternal Grandfather   . Prostate cancer Paternal Grandfather   . Heart disease Paternal Grandfather   . Colon cancer Maternal Grandfather [id number]  . Bladder Cancer Maternal Grandfather   . Skin cancer Maternal Grandfather   . Stroke Maternal Grandfather   . Kidney disease Maternal Grandmother   . Stomach cancer Neg Hx     Allergies  Allergen Reactions  . Beef-Derived Products     Confirmed through allergy testing  . Corn-Containing Products     Confirmed through allergy testing  . Eggs Or Egg-Derived Products     Confirmed through allergy testing  . Milk-Related Compounds     Confirmed through allergy testing  . Peanut-Containing Drug Products     Confirmed through allergy testing  . Red Dye Hives  . Wheat      Confirmed through allergy testing    Current Outpatient Medications on File Prior to Visit  Medication Sig Dispense Refill  . PARoxetine (PAXIL) 40 MG tablet Take 1 tablet (40 mg total) by mouth every morning. 90 tablet 1   No current facility-administered medications on file prior to visit.     BP (!) 138/98 (BP Location: Left Arm, Cuff Size: Normal)   Pulse 71   Temp 99.1 F (37.3 C) (Oral)   Resp 16   Ht 5\' 10"  (1.778 m)   Wt 216 lb 6.4 oz (98.2 kg)   SpO2 100%   BMI 31.05 kg/m       Objective:   Physical Exam  Constitutional: He is oriented to person, place, and time. He appears well-developed and well-nourished. No distress.  HENT:  Head: Normocephalic and atraumatic.  Cardiovascular: Normal rate and regular rhythm.  No murmur heard. Pulmonary/Chest: Effort normal and breath sounds normal. No respiratory distress. He has no wheezes. He has no rales.  Musculoskeletal: He exhibits no edema.  Neurological: He is alert and oriented to person, place, and time.  Skin: Skin is warm and dry.  Psychiatric: He has a normal mood and affect. His behavior is normal. Thought content normal.  GU: normal testicular exam        Assessment & Plan:  Elevated blood pressure- plan repeat bp in 2 weeks. If still elevated may need bp med.  BP Readings from Last 3 Encounters:  02/01/18 (!) 138/98  08/05/17 124/82  08/02/17 131/88   Anxiety- stable on paxil and prn xanax.  Continue same.  Controlled substance contract is updated today.  Will obtain a urine drug screen.  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  However at this time he is having a lot of stress and wishes to continue. [place] controlled substance registry is reviewed and refills are consistent.   Hyperlipidemia- tolerating statin.  Obtain follow-up lipid panel.  Obstructive sleep apnea- he declines CPAP therapy at this time.  Wishes to work on weight loss.  I reassured the patient  that the area of concern on his left testicle is actually the epididymis.  No abnormal mass is noted.  Dysphasia-he canceled his endoscopy due to poor insurance coverage.  He is focusing on making sure that he chews his food very carefully.  GERD-this is stable and improved on Protonix continue same.

## 2018-02-01 NOTE — Patient Instructions (Signed)
Keep up the great work with weight loss.  Complete lab work prior to leaving.

## 2018-02-02 LAB — PAIN MGMT, PROFILE 8 W/CONF, U
6 Acetylmorphine: NEGATIVE ng/mL (ref <10)
ALCOHOL METABOLITES: NEGATIVE ng/mL (ref <500)
Amphetamines: NEGATIVE ng/mL (ref <500)
Benzodiazepines: NEGATIVE ng/mL (ref <100)
Buprenorphine, Urine: NEGATIVE ng/mL (ref <5)
COCAINE METABOLITE: NEGATIVE ng/mL (ref <150)
Creatinine: 90.2 mg/dL
MDMA: NEGATIVE ng/mL (ref <500)
Marijuana Metabolite: NEGATIVE ng/mL (ref <20)
OXIDANT: NEGATIVE ug/mL (ref <200)
OXYCODONE: NEGATIVE ng/mL (ref <100)
Opiates: NEGATIVE ng/mL (ref <100)
pH: 6.74 (ref 4.5–9.0)

## 2018-02-20 ENCOUNTER — Other Ambulatory Visit: Payer: Self-pay | Admitting: Family

## 2018-02-20 MED FILL — PARoxetine HCL 40 MG TABS: 40 | 90 days supply | Qty: 90 | Fill #0

## 2018-02-20 MED FILL — ALPRAZolam 1 MG TABS: 1 | 30 days supply | Qty: 30 | Fill #0

## 2018-02-20 MED FILL — PANTOPRAZOLE SOD DR 40 MG T: 40 | 30 days supply | Qty: 30 | Fill #0

## 2018-02-20 MED FILL — ATORVASTATIN 40 MG TABLET: 40 | 90 days supply | Qty: 90 | Fill #0

## 2018-02-21 ENCOUNTER — Ambulatory Visit (INDEPENDENT_AMBULATORY_CARE_PROVIDER_SITE_OTHER): Payer: 59

## 2018-02-21 VITALS — BP 118/70 | HR 74

## 2018-02-21 DIAGNOSIS — Z0130 Encounter for examination of blood pressure without abnormal findings: Secondary | ICD-10-CM | POA: Diagnosis not present

## 2018-02-21 NOTE — Progress Notes (Signed)
Pt here for Blood pressure check per Melissa   Pt currently takes:No medications for blood pressure   BP today @ =118/70 HR =74  Pt advised per Dr. Abner GreenspanBlyth keep up the good work follow up with PCP 1 month. Patient agreed he states he has appointment with Melissa on 05/08/18. No other concerns.

## 2018-04-05 MED FILL — PANTOPRAZOLE SOD DR 40 MG T: 40 | 30 days supply | Qty: 30 | Fill #1

## 2018-05-08 ENCOUNTER — Ambulatory Visit (INDEPENDENT_AMBULATORY_CARE_PROVIDER_SITE_OTHER): Payer: 59 | Admitting: Family

## 2018-05-08 ENCOUNTER — Encounter: Payer: Self-pay | Admitting: Family

## 2018-05-08 VITALS — BP 120/88 | HR 76 | Temp 98.8°F | Resp 16 | Ht 70.0 in | Wt 205.8 lb

## 2018-05-08 DIAGNOSIS — Z0000 Encounter for general adult medical examination without abnormal findings: Secondary | ICD-10-CM

## 2018-05-08 MED ORDER — ATORVASTATIN CALCIUM 40 MG PO TABS: 40.0000 mg | ORAL_TABLET | Freq: Every day | ORAL | 1 refills | Status: DC

## 2018-05-08 MED ORDER — PANTOPRAZOLE SODIUM 40 MG PO TBEC: 40.0000 mg | DELAYED_RELEASE_TABLET | Freq: Every day | ORAL | 1 refills | Status: DC

## 2018-05-08 MED FILL — PANTOPRAZOLE SOD DR 40 MG T: 40 | 30 days supply | Qty: 30 | Fill #2

## 2018-05-08 NOTE — Patient Instructions (Addendum)
Please schedule a routine dental exam.  Great job with the healthy diet, regular exercise and weight loss.

## 2018-05-08 NOTE — Progress Notes (Signed)
Subjective:    Patient ID: Brandon Jensen, male    DOB: 07-19-1981, 37 y.o.   MRN: [id number]  HPI   Patient presents today for complete physical.  Immunizations: tetanus 2014,declines flu shot Diet: improving Wt Readings from Last 3 Encounters:  05/08/18 205 lb 12.8 oz (93.4 kg)  02/01/18 216 lb 6.4 oz (98.2 kg)  08/05/17 230 lb (104.3 kg)  Exercise: treadmill every dat after work Vision: 1 yr ago Dental: due     Review of Systems  Constitutional: Negative for unexpected weight change.  HENT: Negative for hearing loss and rhinorrhea.   Eyes: Negative for visual disturbance.  Respiratory: Negative for cough.   Cardiovascular: Negative for leg swelling.  Gastrointestinal: Negative for constipation and diarrhea.  Genitourinary: Negative for dysuria, frequency and hematuria.  Musculoskeletal: Negative for arthralgias and myalgias.  Skin: Negative for rash.  Neurological: Negative for headaches.  Hematological: Negative for adenopathy.  Psychiatric/Behavioral:       Reports anxiety symptoms are stable   Past Medical History:  Diagnosis Date  . Allergy   . Anxiety   . GERD (gastroesophageal reflux disease)   . Hyperlipemia   . Obstructive sleep apnea 01/18/2011     Social History   Socioeconomic History  . Marital status: Married    Spouse name: [name]  . Number of children: 2  . Years of education: Not on file  . Highest education level: Not on file  Occupational History  . 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: [employer]  . Occupation: Engineer, materials  Social Needs  . Financial resource strain: Not on file  . Food insecurity:    Worry: Not on file    Inability: Not on file  . Transportation needs:    Medical: Not on file    Non-medical: Not on file  Tobacco Use  . Smoking status: Former Smoker    Types: Cigarettes    Last attempt to quit: 11/11/2013    Years since quitting: 4.4  . Smokeless tobacco: Current User    Types: Snuff  . Tobacco  comment: stopped using snuff in 11/2017  Substance and Sexual Activity  . Alcohol use: No    Alcohol/week: 0.0 standard drinks    Comment: No alcohol in 1 year  . Drug use: No  . Sexual activity: Yes    Partners: Female  Lifestyle  . Physical activity:    Days per week: Not on file    Minutes per session: Not on file  . Stress: Not on file  Relationships  . Social connections:    Talks on phone: Not on file    Gets together: Not on file    Attends religious service: Not on file    Active member of club or organization: Not on file    Attends meetings of clubs or organizations: Not on file    Relationship status: Not on file  . Intimate partner violence:    Fear of current or ex partner: Not on file    Emotionally abused: Not on file    Physically abused: Not on file    Forced sexual activity: Not on file  Other Topics Concern  . 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 INGUINAL HERNIA REPAIR      Family  History  Problem Relation Age of Onset  . Hypertension Paternal Grandfather   . Stroke Paternal Grandfather   . Prostate cancer Paternal Grandfather   . Heart disease Paternal Grandfather   . Colon cancer Maternal Grandfather [id number]  . Bladder Cancer Maternal Grandfather   . Skin cancer Maternal Grandfather   . Stroke Maternal Grandfather   . Kidney disease Maternal Grandmother   . Stomach cancer Neg Hx     Allergies  Allergen Reactions  . Beef-Derived Products     Confirmed through allergy testing  . Corn-Containing Products     Confirmed through allergy testing  . Eggs Or Egg-Derived Products     Confirmed through allergy testing  . Milk-Related Compounds     Confirmed through allergy testing  . Peanut-Containing Drug Products     Confirmed through allergy testing  . Red Dye Hives  . Wheat      Confirmed through allergy testing    Current Outpatient Medications on File Prior to Visit  Medication Sig Dispense Refill  . ALPRAZolam (XANAX) 1 MG tablet One tab by mouth once daily as needed 30 tablet 0  . atorvastatin (LIPITOR) 40 MG tablet Take 1 tablet (40 mg total) by mouth daily. 90 tablet 1  . pantoprazole (PROTONIX) 40 MG tablet Take 1 tablet (40 mg total) by mouth daily. 90 tablet 1  . PARoxetine (PAXIL) 40 MG tablet TAKE 1 TABLET (40 MG TOTAL) BY MOUTH EVERY MORNING. 90 tablet 1   No current facility-administered medications on file prior to visit.     BP 120/88 (BP Location: Right Arm, Cuff Size: Normal)   Pulse 76   Temp 98.8 F (37.1 C) (Oral)   Resp 16   Ht 5\' 10"  (1.778 m)   Wt 205 lb 12.8 oz (93.4 kg)   SpO2 98%   BMI 29.53 kg/m       Objective:   Physical Exam  Physical Exam  Constitutional: He is oriented to person, place, and time. He appears well-developed and well-nourished. No distress.  HENT:  Head: Normocephalic and atraumatic.  Right Ear: Tympanic membrane and ear canal normal.  Left Ear: Tympanic membrane and ear canal normal.  Mouth/Throat: Oropharynx is clear and moist.  Eyes: Pupils are equal, round, and reactive to light. No scleral icterus.  Neck: Normal range of motion. No thyromegaly present.  Cardiovascular: Normal rate and regular rhythm.   No murmur heard. Pulmonary/Chest: Effort normal and breath sounds normal. No respiratory distress. He has no wheezes. He has no rales. He exhibits no tenderness.  Abdominal: Soft. Bowel sounds are normal. He exhibits no distension and no mass. There is no tenderness. There is no rebound and no guarding.  Musculoskeletal: He exhibits no edema.  Lymphadenopathy:    He has no cervical adenopathy.  Neurological: He is alert and oriented to person, place, and time. He has normal patellar reflexes. He exhibits normal muscle tone. Coordination normal.  Skin: Skin is warm and dry. Sunburn noted on  scalp  Psychiatric: He has a normal mood and affect. His behavior is normal. Judgment and thought content normal.           Assessment & Plan:  Preventative care- he declines lab work today due to cost. I commended him on his weight loss. He continues to use chewing tobacco and we reviewed the health risks associated with this. He is encouraged to discontinue use.  Declines flu shot. Due for dental exam.        Assessment &  Plan:

## 2018-06-06 MED FILL — PARoxetine HCL 40 MG TABS: 40 | 90 days supply | Qty: 90 | Fill #1

## 2018-06-06 MED FILL — PANTOPRAZOLE SOD DR 40 MG T: 40 | 30 days supply | Qty: 30 | Fill #3

## 2018-06-06 MED FILL — ATORVASTATIN 40 MG TABLET: 40 | 90 days supply | Qty: 90 | Fill #1

## 2018-07-11 MED FILL — PANTOPRAZOLE SOD DR 40 MG T: 40 | 30 days supply | Qty: 30 | Fill #4

## 2018-08-10 MED FILL — PANTOPRAZOLE SOD DR 40 MG T: 40 | 30 days supply | Qty: 30 | Fill #5

## 2018-09-07 ENCOUNTER — Other Ambulatory Visit: Payer: Self-pay | Admitting: Family

## 2018-09-07 MED ORDER — PAROXETINE HCL 40 MG PO TABS: 40.0000 mg | ORAL_TABLET | Freq: Every morning | ORAL | 0 refills | Status: DC

## 2018-09-07 MED ORDER — PANTOPRAZOLE SODIUM 40 MG PO TBEC: 40.0000 mg | DELAYED_RELEASE_TABLET | Freq: Every day | ORAL | 0 refills | Status: DC

## 2018-09-07 MED ORDER — ATORVASTATIN CALCIUM 40 MG PO TABS: 40.0000 mg | ORAL_TABLET | Freq: Every day | ORAL | 0 refills | Status: DC

## 2018-09-07 MED FILL — ATORVASTATIN 40 MG TABLET: 40 | 90 days supply | Qty: 90 | Fill #0

## 2018-09-07 MED FILL — PANTOPRAZOLE SOD DR 40 MG T: 40 | 30 days supply | Qty: 30 | Fill #2

## 2018-09-07 MED FILL — PARoxetine HCL 40 MG TABS: 40 | 90 days supply | Qty: 90 | Fill #0

## 2018-09-07 NOTE — Telephone Encounter (Signed)
Copied from CRM [id number]. Topic: Quick Communication - Rx Refill/Question >> Sep 07, 2018  4:35 PM Wyonia Hough E wrote: Medication: atorvastatin (LIPITOR) 40 MG tablet  pantoprazole (PROTONIX) 40 MG tablet   PARoxetine (PAXIL) 40 MG tablet  Has the patient contacted their pharmacy? No    Preferred Pharmacy (with phone number or street name): [facility] - [place], [place] - [phone] [phone] (Phone) [phone] (Fax    Agent: Please be advised that RX refills may take up to 3 business days. We ask that you follow-up with your pharmacy.

## 2018-09-13 ENCOUNTER — Ambulatory Visit: Payer: 59 | Admitting: Family

## 2018-10-16 MED FILL — PANTOPRAZOLE SOD DR 40 MG T: 40 | 30 days supply | Qty: 30 | Fill #3

## 2018-11-20 MED FILL — PANTOPRAZOLE SOD DR 40 MG T: 40 | 30 days supply | Qty: 30 | Fill #0

## 2018-12-13 ENCOUNTER — Telehealth: Payer: Self-pay | Admitting: *Deleted

## 2018-12-13 NOTE — Telephone Encounter (Signed)
Pt last seen by PCP 05/08/18 and was due for 6 month f/u 11/06/18. Left detailed message for pt to call to discuss options for virtual visit and to schedule appt.

## 2018-12-15 ENCOUNTER — Other Ambulatory Visit: Payer: Self-pay

## 2018-12-15 ENCOUNTER — Ambulatory Visit (INDEPENDENT_AMBULATORY_CARE_PROVIDER_SITE_OTHER): Payer: 59 | Admitting: Family

## 2018-12-15 DIAGNOSIS — F419 Anxiety disorder, unspecified: Secondary | ICD-10-CM | POA: Diagnosis not present

## 2018-12-15 DIAGNOSIS — K219 Gastro-esophageal reflux disease without esophagitis: Secondary | ICD-10-CM | POA: Diagnosis not present

## 2018-12-15 DIAGNOSIS — E785 Hyperlipidemia, unspecified: Secondary | ICD-10-CM

## 2018-12-15 MED ORDER — PAROXETINE HCL 40 MG PO TABS: 40.0000 mg | ORAL_TABLET | Freq: Every morning | ORAL | 1 refills | Status: DC

## 2018-12-15 MED ORDER — ATORVASTATIN CALCIUM 40 MG PO TABS: 40.0000 mg | ORAL_TABLET | Freq: Every day | ORAL | 1 refills | Status: DC

## 2018-12-15 MED ORDER — PANTOPRAZOLE SODIUM 40 MG PO TBEC: 40.0000 mg | DELAYED_RELEASE_TABLET | Freq: Every day | ORAL | 1 refills | Status: DC

## 2018-12-15 MED FILL — ATORVASTATIN 40 MG TABLET: 40 | 90 days supply | Qty: 90 | Fill #0

## 2018-12-15 MED FILL — PARoxetine HCL 40 MG TABS: 40 | 90 days supply | Qty: 90 | Fill #0

## 2018-12-15 NOTE — Progress Notes (Signed)
Virtual Visit via Video Note  I connected with Marcie Bal on 12/15/18 at  9:00 AM EDT by a video enabled telemedicine application and verified that I am speaking with the correct person using two identifiers. This visit type was conducted due to national recommendations for restrictions regarding the COVID-19 Pandemic (e.g. social distancing).  This format is felt to be most appropriate for this patient at this time.   I discussed the limitations of evaluation and management by telemedicine and the availability of in person appointments. The patient expressed understanding and agreed to proceed.  Only the patient and myself were on today's video visit. The patient was at home and I was in my office at the time of today's visit.   History of Present Illness:   Anxiety- reports anxiety has been stable on paxil Reports that he is using xanax 1-2 times a month.  Reports last weight was 210.  Wt Readings from Last 3 Encounters:  05/08/18 205 lb 12.8 oz (93.4 kg)  02/01/18 216 lb 6.4 oz (98.2 kg)  08/05/17 230 lb (104.3 kg)   Hyperlipidemia- reports that he continues lipitor.  Lab Results  Component Value Date   CHOL 138 02/01/2018   HDL 37.00 (L) 02/01/2018   LDLCALC 72 01/28/2017   LDLDIRECT 67.0 02/01/2018   TRIG 228.0 (H) 02/01/2018   CHOLHDL 4 02/01/2018   GERD- reports that he no longer has gerd symptoms.     Observations/Objective:   Gen: Awake, alert, no acute distress Resp: Breathing is even and non-labored Psych: calm/pleasant demeanor Neuro: Alert and Oriented x 3, + facial symmetry, speech is clear.   Assessment and Plan:  Anxiety- stable. Continue paxil and prn xanax.  Hyperlipidemia- we discussed importance of diet and weight loss. Continue statin.  GERD- stable on PPI, continue same.  Follow Up Instructions:    I discussed the assessment and treatment plan with the patient. The patient was provided an opportunity to ask questions and all were  answered. The patient agreed with the plan and demonstrated an understanding of the instructions.   The patient was advised to call back or seek an in-person evaluation if the symptoms worsen or if the condition fails to improve as anticipated.    Lemont Fillers, NP

## 2018-12-18 MED FILL — PANTOPRAZOLE SOD DR 40 MG T: 40 | 90 days supply | Qty: 90 | Fill #0

## 2018-12-20 NOTE — Telephone Encounter (Signed)
Pt seen 12/15/18.

## 2019-03-23 MED FILL — PARoxetine HCL 40 MG TABS: 40 | 90 days supply | Qty: 90 | Fill #1

## 2019-03-23 MED FILL — PANTOPRAZOLE SOD DR 40 MG T: 40 | 90 days supply | Qty: 90 | Fill #1

## 2019-03-23 MED FILL — ATORVASTATIN 40 MG TABLET: 40 | 90 days supply | Qty: 90 | Fill #1

## 2019-06-20 ENCOUNTER — Encounter: Payer: Self-pay | Admitting: Family

## 2019-06-20 ENCOUNTER — Ambulatory Visit (INDEPENDENT_AMBULATORY_CARE_PROVIDER_SITE_OTHER): Payer: No Typology Code available for payment source | Admitting: Family

## 2019-06-20 ENCOUNTER — Other Ambulatory Visit: Payer: Self-pay

## 2019-06-20 VITALS — BP 132/82 | HR 80 | Temp 97.4°F | Resp 16 | Ht 70.0 in | Wt 216.3 lb

## 2019-06-20 DIAGNOSIS — K219 Gastro-esophageal reflux disease without esophagitis: Secondary | ICD-10-CM

## 2019-06-20 DIAGNOSIS — E1169 Type 2 diabetes mellitus with other specified complication: Secondary | ICD-10-CM | POA: Diagnosis not present

## 2019-06-20 DIAGNOSIS — F419 Anxiety disorder, unspecified: Secondary | ICD-10-CM

## 2019-06-20 DIAGNOSIS — G4733 Obstructive sleep apnea (adult) (pediatric): Secondary | ICD-10-CM

## 2019-06-20 DIAGNOSIS — Z0000 Encounter for general adult medical examination without abnormal findings: Secondary | ICD-10-CM | POA: Diagnosis not present

## 2019-06-20 DIAGNOSIS — E785 Hyperlipidemia, unspecified: Secondary | ICD-10-CM

## 2019-06-20 LAB — CBC WITH DIFFERENTIAL/PLATELET
Basophils Absolute: 0 10*3/uL (ref 0.0–0.1)
Basophils Relative: 0.6 % (ref 0.0–3.0)
Eosinophils Absolute: 0.3 10*3/uL (ref 0.0–0.7)
Eosinophils Relative: 4.3 % (ref 0.0–5.0)
HCT: 42.2 % (ref 39.0–52.0)
Hemoglobin: 14.7 g/dL (ref 13.0–17.0)
Lymphocytes Relative: 37.4 % (ref 12.0–46.0)
Lymphs Abs: 2.4 10*3/uL (ref 0.7–4.0)
MCHC: 34.9 g/dL (ref 30.0–36.0)
MCV: 90.4 fl (ref 78.0–100.0)
Monocytes Absolute: 0.4 10*3/uL (ref 0.1–1.0)
Monocytes Relative: 7 % (ref 3.0–12.0)
Neutro Abs: 3.2 10*3/uL (ref 1.4–7.7)
Neutrophils Relative %: 50.7 % (ref 43.0–77.0)
Platelets: 231 10*3/uL (ref 150.0–400.0)
RBC: 4.67 Mil/uL (ref 4.22–5.81)
RDW: 12.8 % (ref 11.5–15.5)
WBC: 6.3 10*3/uL (ref 4.0–10.5)

## 2019-06-20 LAB — HEPATIC FUNCTION PANEL
ALT: 44 U/L (ref 0–53)
AST: 22 U/L (ref 0–37)
Albumin: 4.6 g/dL (ref 3.5–5.2)
Alkaline Phosphatase: 72 U/L (ref 39–117)
Bilirubin, Direct: 0.1 mg/dL (ref 0.0–0.3)
Total Bilirubin: 0.7 mg/dL (ref 0.2–1.2)
Total Protein: 6.7 g/dL (ref 6.0–8.3)

## 2019-06-20 LAB — URINALYSIS, ROUTINE W REFLEX MICROSCOPIC
Bilirubin Urine: NEGATIVE
Hgb urine dipstick: NEGATIVE
Ketones, ur: NEGATIVE
Leukocytes,Ua: NEGATIVE
Nitrite: NEGATIVE
RBC / HPF: NONE SEEN (ref 0–?)
Specific Gravity, Urine: <=1.005 — AB (ref 1.000–1.030)
Total Protein, Urine: NEGATIVE
Urine Glucose: NEGATIVE
Urobilinogen, UA: 0.2 (ref 0.0–1.0)
WBC, UA: NONE SEEN (ref 0–?)
pH: 6 (ref 5.0–8.0)

## 2019-06-20 LAB — LIPID PANEL
Cholesterol: 145 mg/dL (ref 0–200)
HDL: 34.8 mg/dL — ABNORMAL LOW (ref >39.00)
LDL Cholesterol: 76 mg/dL (ref 0–99)
NonHDL: 110.4
Total CHOL/HDL Ratio: 4
Triglycerides: 172 mg/dL — ABNORMAL HIGH (ref 0.0–149.0)
VLDL: 34.4 mg/dL (ref 0.0–40.0)

## 2019-06-20 LAB — BASIC METABOLIC PANEL
BUN: 16 mg/dL (ref 6–23)
CO2: 28 mEq/L (ref 19–32)
Calcium: 9.5 mg/dL (ref 8.4–10.5)
Chloride: 104 mEq/L (ref 96–112)
Creatinine, Ser: 0.98 mg/dL (ref 0.40–1.50)
GFR: 85.34 mL/min (ref >60.00)
Glucose, Bld: 95 mg/dL (ref 70–99)
Potassium: 4.3 mEq/L (ref 3.5–5.1)
Sodium: 139 mEq/L (ref 135–145)

## 2019-06-20 MED ORDER — PANTOPRAZOLE SODIUM 40 MG PO TBEC: 40.0000 mg | DELAYED_RELEASE_TABLET | Freq: Every day | ORAL | 1 refills | Status: DC

## 2019-06-20 MED ORDER — ATORVASTATIN CALCIUM 40 MG PO TABS: 40.0000 mg | ORAL_TABLET | Freq: Every day | ORAL | 1 refills | Status: DC

## 2019-06-20 MED ORDER — PAROXETINE HCL 40 MG PO TABS: 40.0000 mg | ORAL_TABLET | Freq: Every morning | ORAL | 1 refills | Status: DC

## 2019-06-20 MED FILL — PANTOPRAZOLE SOD DR 40 MG T: 40 | 90 days supply | Qty: 90 | Fill #0

## 2019-06-20 MED FILL — ATORVASTATIN 40 MG TABLET: 40 | 90 days supply | Qty: 90 | Fill #0

## 2019-06-20 MED FILL — PARoxetine HCL 40 MG TABS: 40 | 90 days supply | Qty: 90 | Fill #0

## 2019-06-20 NOTE — Progress Notes (Signed)
Subjective:    Patient ID: Brandon Jensen, male    DOB: [birth year], 38 y.o.   MRN: [id number]  HPI   Patient presents today for complete physical.  Immunizations: tdap 2014, declines flu shot Diet:needs improvement Wt Readings from Last 3 Encounters:  06/20/19 216 lb 4.8 oz (98.1 kg)  05/08/18 205 lb 12.8 oz (93.4 kg)  02/01/18 216 lb 6.4 oz (98.2 kg)  Exercise: not exercising Vision: 2-3 years ago Dental: due  His is living with his parents-he and his wife have separated.  His older daughter is predominantly staying with him and his younger daughter is splitting her time between the patient and her mother.  Anxiety-he continues to use Paxil.  Feels like his overall anxiety is controlled.  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   OSA- reports that he still snores.  He is not currently using his CPAP as his previous insurance would not cover.  GERD- reports improvement in his symptoms with use of PPI.  Review of Systems  Constitutional: Negative for unexpected weight change.  HENT: Negative for rhinorrhea.   Respiratory: Positive for cough (after eating). Negative for shortness of breath.   Cardiovascular: Negative for chest pain.  Gastrointestinal: Negative for blood in stool, constipation and diarrhea.  Genitourinary: Negative for dysuria, frequency and hematuria.  Musculoskeletal: Negative for arthralgias and myalgias.  Skin: Negative for rash.  Neurological: Negative for headaches.  Hematological: Negative for adenopathy.  Psychiatric/Behavioral: The patient is nervous/anxious.        See HPI   Past Medical History:  Diagnosis Date  . Allergy   . Anxiety   . GERD (gastroesophageal reflux disease)   . Hyperlipemia   . Obstructive sleep apnea 01/18/2011     Social History   Socioeconomic History  . Marital status: Married    Spouse name: Romelle Starcher  . Number of children: 2  . Years of education: Not on file  .  Highest education level: Not on file  Occupational History  . Occupation: DELI Armed forces operational officer: [employer]  . Occupation: Animal nutritionist  Social Needs  . Financial resource strain: Not on file  . Food insecurity    Worry: Not on file    Inability: Not on file  . Transportation needs    Medical: Not on file    Non-medical: Not on file  Tobacco Use  . Smoking status: Former Smoker    Types: Cigarettes    Quit date: 11/11/2013    Years since quitting: 5.6  . Smokeless tobacco: Current User    Types: Snuff  Substance and Sexual Activity  . Alcohol use: No    Alcohol/week: 0.0 standard drinks    Comment: 1-2 drinks a month  . Drug use: No  . Sexual activity: Yes    Partners: Female  Lifestyle  . Physical activity    Days per week: Not on file    Minutes per session: Not on file  . Stress: Not on file  Relationships  . Social Herbalist on phone: Not on file    Gets together: Not on file    Attends religious service: Not on file    Active member of club or organization: Not on file    Attends meetings of clubs or organizations: Not on file    Relationship status: Not on file  . Intimate partner violence    Fear of current or ex partner:  Not on file    Emotionally abused: Not on file    Physically abused: Not on file    Forced sexual activity: Not on file  Other Topics Concern  . 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 INGUINAL HERNIA REPAIR      Family History  Problem Relation Age of Onset  . Hypertension Paternal Grandfather   . Stroke Paternal Grandfather   . Prostate cancer Paternal Grandfather   . Heart disease Paternal Grandfather   . Colon cancer Maternal Grandfather 29  . Bladder Cancer Maternal Grandfather   . Skin cancer Maternal Grandfather   . Stroke Maternal  Grandfather   . Kidney disease Maternal Grandmother   . Anxiety disorder Sister   . Stomach cancer Neg Hx     Allergies  Allergen Reactions  . Beef-Derived Products     Confirmed through allergy testing  . Corn-Containing Products     Confirmed through allergy testing  . Eggs Or Egg-Derived Products     Confirmed through allergy testing  . Milk-Related Compounds     Confirmed through allergy testing  . Peanut-Containing Drug Products     Confirmed through allergy testing  . Red Dye Hives  . Wheat     Confirmed through allergy testing    Current Outpatient Medications on File Prior to Visit  Medication Sig Dispense Refill  . ALPRAZolam (XANAX) 1 MG tablet One tab by mouth once daily as needed 30 tablet 0   No current facility-administered medications on file prior to visit.     BP (!) 141/90 (BP Location: Right Arm, Patient Position: Sitting, Cuff Size: Small)   Pulse 80   Temp (!) 97.4 F (36.3 C) (Temporal)   Resp 16   Ht 5\' 10"  (1.778 m)   Wt 216 lb 4.8 oz (98.1 kg)   SpO2 100%   BMI 31.04 kg/m       Objective:   Physical Exam  Physical Exam  Constitutional: He is oriented to person, place, and time. He appears well-developed and well-nourished. No distress.  HENT:  Head: Normocephalic and atraumatic.  Right Ear: Tympanic membrane and ear canal normal.  Left Ear: Tympanic membrane and ear canal normal.  Mouth/Throat: Oropharynx is clear and moist.  Eyes: Pupils are equal, round, and reactive to light. No scleral icterus.  Neck: Normal range of motion. No thyromegaly present.  Cardiovascular: Normal rate and regular rhythm.   No murmur heard. Pulmonary/Chest: Effort normal and breath sounds normal. No respiratory distress. He has no wheezes. He has no rales. He exhibits no tenderness.  Abdominal: Soft. Bowel sounds are normal. He exhibits no distension and no mass. There is no tenderness. There is no rebound and no guarding.  Musculoskeletal: He exhibits no  edema.  Lymphadenopathy:    He has no cervical adenopathy.  Neurological: He is alert and oriented to person, place, and time. He has normal patellar reflexes. He exhibits normal muscle tone. Coordination normal.  Skin: Skin is warm and dry.  Psychiatric: He has a normal mood and affect. His behavior is normal. Judgment and thought content normal.           Assessment & Plan:   Preventive care-we discussed healthy diet, exercise, and weight loss.  Also discussed discontinuing tobacco use.  Patient  is not motivated to quit at this time.  Will obtain routine lab work.  He declines flu shot.  Anxiety-overall anxiety is well controlled.  He does seem more irritable to me today than his baseline.  We did discuss possibility of some underlying depression.  He is not interested in changing or adding any additional medication at this time.  I did offer information about our counseling services.  Patient states he will think about it.  Obstructive sleep apnea-we will try to reinitiate home CPAP with his new insurance.  Hopefully will now be affordable to him.  Hyperlipidemia-continue statin.  Will obtain follow-up lipid panel.  GERD-stable on PPI, continue same.    Anxiety- stable. Continue paxil and prn xanax.  Controlled substance contract is updated today and a urine drug screen will be performed. Assessment & Plan:

## 2019-06-20 NOTE — Patient Instructions (Addendum)
Please complete lab work prior to leaving.   

## 2019-06-21 LAB — PAIN MGMT, PROFILE 8 W/CONF, U
6 Acetylmorphine: NEGATIVE ng/mL
Alcohol Metabolites: NEGATIVE ng/mL (ref <500)
Amphetamines: NEGATIVE ng/mL
Benzodiazepines: NEGATIVE ng/mL
Buprenorphine, Urine: NEGATIVE ng/mL
Cocaine Metabolite: NEGATIVE ng/mL
Creatinine: 42 mg/dL
MDMA: NEGATIVE ng/mL
Marijuana Metabolite: NEGATIVE ng/mL
Opiates: NEGATIVE ng/mL
Oxidant: NEGATIVE ug/mL
Oxycodone: NEGATIVE ng/mL
pH: 5.5 (ref 4.5–9.0)

## 2019-09-27 MED FILL — PARoxetine HCL 40 MG TABS: 40 | 90 days supply | Qty: 90 | Fill #1

## 2019-09-27 MED FILL — PANTOPRAZOLE SOD DR 40 MG T: 40 | 90 days supply | Qty: 90 | Fill #1

## 2019-09-27 MED FILL — ATORVASTATIN 40 MG TABLET: 40 | 90 days supply | Qty: 90 | Fill #1

## 2019-12-25 ENCOUNTER — Ambulatory Visit: Payer: No Typology Code available for payment source | Admitting: Family

## 2019-12-25 ENCOUNTER — Other Ambulatory Visit: Payer: Self-pay

## 2019-12-25 MED ORDER — ALPRAZOLAM 1 MG PO TABS: ORAL_TABLET | ORAL | 0 refills | Status: DC

## 2019-12-25 MED ORDER — ATORVASTATIN CALCIUM 40 MG PO TABS: 40.0000 mg | ORAL_TABLET | Freq: Every day | ORAL | 1 refills | Status: DC

## 2019-12-25 MED ORDER — PANTOPRAZOLE SODIUM 40 MG PO TBEC: 40.0000 mg | DELAYED_RELEASE_TABLET | Freq: Every day | ORAL | 1 refills | Status: DC

## 2019-12-25 MED ORDER — PAROXETINE HCL 40 MG PO TABS: 40.0000 mg | ORAL_TABLET | Freq: Every morning | ORAL | 1 refills | Status: DC

## 2019-12-25 NOTE — Telephone Encounter (Signed)
Requesting:Alprazolam Contract: 02/02/2018 UDS:06/20/2019 Last Visit:06/20/2019 Next Visit: none scheduled however he had appt today today but had to reschedule for provider.  Last Refill: 02/01/2018  Please Advise

## 2020-04-03 MED FILL — PARoxetine HCL 40 MG TABS: 40 | 90 days supply | Qty: 90 | Fill #1

## 2020-04-03 MED FILL — PANTOPRAZOLE SOD DR 40 MG T: 40 | 90 days supply | Qty: 90 | Fill #1

## 2020-04-03 MED FILL — ATORVASTATIN CALCIUM 40 MG: 40 | 90 days supply | Qty: 90 | Fill #1

## 2020-06-30 ENCOUNTER — Other Ambulatory Visit: Payer: Self-pay | Admitting: Family

## 2020-06-30 ENCOUNTER — Encounter: Payer: Self-pay | Admitting: Family

## 2020-06-30 ENCOUNTER — Ambulatory Visit (INDEPENDENT_AMBULATORY_CARE_PROVIDER_SITE_OTHER): Payer: No Typology Code available for payment source | Admitting: Family

## 2020-06-30 ENCOUNTER — Other Ambulatory Visit: Payer: Self-pay

## 2020-06-30 VITALS — BP 135/88 | HR 94 | Temp 98.7°F | Resp 16 | Ht 70.0 in | Wt 211.0 lb

## 2020-06-30 DIAGNOSIS — E1169 Type 2 diabetes mellitus with other specified complication: Secondary | ICD-10-CM | POA: Diagnosis not present

## 2020-06-30 DIAGNOSIS — G4733 Obstructive sleep apnea (adult) (pediatric): Secondary | ICD-10-CM

## 2020-06-30 DIAGNOSIS — F419 Anxiety disorder, unspecified: Secondary | ICD-10-CM | POA: Diagnosis not present

## 2020-06-30 DIAGNOSIS — Z0000 Encounter for general adult medical examination without abnormal findings: Secondary | ICD-10-CM

## 2020-06-30 DIAGNOSIS — E785 Hyperlipidemia, unspecified: Secondary | ICD-10-CM

## 2020-06-30 LAB — LIPID PANEL
Cholesterol: 164 mg/dL (ref 0–200)
HDL: 49.3 mg/dL (ref >39.00)
LDL Cholesterol: 98 mg/dL (ref 0–99)
NonHDL: 114.89
Total CHOL/HDL Ratio: 3
Triglycerides: 84 mg/dL (ref 0.0–149.0)
VLDL: 16.8 mg/dL (ref 0.0–40.0)

## 2020-06-30 LAB — COMPREHENSIVE METABOLIC PANEL
ALT: 40 U/L (ref 0–53)
AST: 24 U/L (ref 0–37)
Albumin: 4.7 g/dL (ref 3.5–5.2)
Alkaline Phosphatase: 73 U/L (ref 39–117)
BUN: 13 mg/dL (ref 6–23)
CO2: 30 mEq/L (ref 19–32)
Calcium: 9.7 mg/dL (ref 8.4–10.5)
Chloride: 103 mEq/L (ref 96–112)
Creatinine, Ser: 1.03 mg/dL (ref 0.40–1.50)
GFR: 91.45 mL/min (ref >60.00)
Glucose, Bld: 88 mg/dL (ref 70–99)
Potassium: 4.2 mEq/L (ref 3.5–5.1)
Sodium: 140 mEq/L (ref 135–145)
Total Bilirubin: 0.9 mg/dL (ref 0.2–1.2)
Total Protein: 7.1 g/dL (ref 6.0–8.3)

## 2020-06-30 MED ORDER — PAROXETINE HCL 40 MG PO TABS: 40.0000 mg | ORAL_TABLET | Freq: Every morning | ORAL | 1 refills | Status: DC

## 2020-06-30 MED ORDER — ATORVASTATIN CALCIUM 40 MG PO TABS: 40.0000 mg | ORAL_TABLET | Freq: Every day | ORAL | 1 refills | Status: DC

## 2020-06-30 MED ORDER — PANTOPRAZOLE SODIUM 40 MG PO TBEC: 40.0000 mg | DELAYED_RELEASE_TABLET | Freq: Every day | ORAL | 1 refills | Status: DC

## 2020-06-30 MED ORDER — ALPRAZOLAM 1 MG PO TABS: ORAL_TABLET | ORAL | 2 refills | Status: DC

## 2020-06-30 MED FILL — ATORVASTATIN CALCIUM 40 MG: 40 | 90 days supply | Qty: 90 | Fill #0

## 2020-06-30 MED FILL — PANTOPRAZOLE SOD DR 40 MG T: 40 | 90 days supply | Qty: 90 | Fill #0

## 2020-06-30 MED FILL — ALPRAZolam 1 MG TABS: 1 | 30 days supply | Qty: 30 | Fill #0

## 2020-06-30 MED FILL — PARoxetine HCL 40 MG TABS: 40 | 90 days supply | Qty: 90 | Fill #0

## 2020-06-30 NOTE — Progress Notes (Signed)
Subjective:    Patient ID: Brandon Jensen, male    DOB: August 22, 1980, 39 y.o.   MRN: [id number]  HPI  Patient presents today for complete physical.  Immunizations:  Declines flu shot, declines covid vaccine Diet: fair diet Wt Readings from Last 3 Encounters:  06/30/20 211 lb (95.7 kg)  06/20/19 216 lb 4.8 oz (98.1 kg)  05/08/18 205 lb 12.8 oz (93.4 kg)  Exercise: no formal exercise Vision:  due Dental: last year  Anxiety- maintained on paxil 40mg  once daily and prn xanax. Reports stable.  Using xanax more often.   Hyperlipidemia- maintained on atorvastatin 40mg .  Lab Results  Component Value Date   CHOL 145 06/20/2019   HDL 34.80 (L) 06/20/2019   LDLCALC 76 06/20/2019   LDLDIRECT 67.0 02/01/2018   TRIG 172.0 (H) 06/20/2019   CHOLHDL 4 06/20/2019   GERD- maintained on protonix 40mg .   OSA- not using cpap. Finds himself very tired.  Has been working a lot lately.  States that he was out of work for most of the month of August due to covid infection/PNA.   Review of Systems  Constitutional: Negative for unexpected weight change.  HENT: Negative for hearing loss and rhinorrhea.   Eyes: Negative for visual disturbance.  Respiratory: Negative for cough and shortness of breath.   Cardiovascular: Negative for chest pain.  Gastrointestinal: Negative for constipation and diarrhea.  Genitourinary: Negative for dysuria, frequency and hematuria.  Musculoskeletal: Negative for arthralgias.  Neurological: Negative for headaches.  Hematological: Negative for adenopathy.  Psychiatric/Behavioral:       Denies depression   Past Medical History:  Diagnosis Date  . Allergy   . Anxiety   . GERD (gastroesophageal reflux disease)   . Hyperlipemia   . Obstructive sleep apnea 01/18/2011     Social History   Socioeconomic History  . Marital status: Married    Spouse name:  . Number of children: 2  . Years of education: Not on file  . Highest education level: Not on file   Occupational History  . 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 Occupation: 03/20/2011  Tobacco Use  . Smoking status: Former Smoker    Types: Cigarettes    Quit date: 11/11/2013    Years since quitting: 6.6  . Smokeless tobacco: Current User    Types: Snuff  Vaping Use  . Vaping Use: Never used  Substance and Sexual Activity  . Alcohol use: No    Alcohol/week: 0.0 standard drinks    Comment: 1-2 drinks a month  . Drug use: No  . Sexual activity: Yes    Partners: Female  Other Topics Concern  . Not on file  Social History Narrative   Works at [employer], [employer], materials. Works three 12 hour shifts- night shifts   2 children   Married      11/13/2013 Strain:   . Difficulty of Paying Living Expenses: Not on file  Food Insecurity:   . Worried About [facility] in the Last Year: Not on file  . Ran Out of Food in the Last Year: Not on file  Transportation Needs:   . Lack of Transportation (Medical): Not on file  . Lack of Transportation (Non-Medical): Not on file  Physical Activity:   . Days of Exercise per Week: Not on file  . Minutes of Exercise per Session: Not on file  Stress:   . Feeling of  Stress : Not on file  Social Connections:   . Frequency of Communication with Friends and Family: Not on file  . Frequency of Social Gatherings with Friends and Family: Not on file  . Attends Religious Services: Not on file  . Active Member of Clubs or Organizations: Not on file  . Attends Banker Meetings: Not on file  . Marital Status: Not on file  Intimate Partner Violence:   . Fear of Current or Ex-Partner: Not on file  . Emotionally Abused: Not on file  . Physically Abused: Not on file  . Sexually Abused: Not on file    Past Surgical History:  Procedure Laterality Date  . INGUINAL HERNIA REPAIR      Family History  Problem Relation Age of Onset  . Hypertension Paternal  Grandfather   . Stroke Paternal Grandfather   . Prostate cancer Paternal Grandfather   . Heart disease Paternal Grandfather   . Colon cancer Maternal Grandfather 68  . Bladder Cancer Maternal Grandfather   . Skin cancer Maternal Grandfather   . Stroke Maternal Grandfather   . Kidney disease Maternal Grandmother   . Anxiety disorder Sister   . Stomach cancer Neg Hx     Allergies  Allergen Reactions  . Beef-Derived Products     Confirmed through allergy testing  . Corn-Containing Products     Confirmed through allergy testing  . Eggs Or Egg-Derived Products     Confirmed through allergy testing  . Milk-Related Compounds     Confirmed through allergy testing  . Peanut-Containing Drug Products     Confirmed through allergy testing  . Red Dye Hives  . Wheat     Confirmed through allergy testing    Current Outpatient Medications on File Prior to Visit  Medication Sig Dispense Refill  . ALPRAZolam (XANAX) 1 MG tablet One tab by mouth once daily as needed 30 tablet 0  . atorvastatin (LIPITOR) 40 MG tablet Take 1 tablet (40 mg total) by mouth daily. 90 tablet 1  . pantoprazole (PROTONIX) 40 MG tablet Take 1 tablet (40 mg total) by mouth daily. 90 tablet 1  . PARoxetine (PAXIL) 40 MG tablet Take 1 tablet (40 mg total) by mouth every morning. 90 tablet 1   No current facility-administered medications on file prior to visit.    BP (!) 135/98 (BP Location: Left Arm, Patient Position: Sitting, Cuff Size: Small)   Pulse 94   Temp 98.7 F (37.1 C) (Oral)   Resp 16   Ht 5\' 10"  (1.778 m)   Wt 211 lb (95.7 kg)   SpO2 99%   BMI 30.28 kg/m       Objective:   Physical Exam  Physical Exam  Constitutional: He is oriented to person, place, and time. He appears well-developed and well-nourished. No distress.  HENT:  Head: Normocephalic and atraumatic.  Right Ear: Tympanic membrane and ear canal normal.  Left Ear: Tympanic membrane and ear canal normal.  Mouth/Throat: Not examined-  pt wearing mask Eyes: Pupils are equal, round, and reactive to light. No scleral icterus.  Neck: Normal range of motion. No thyromegaly present.  Cardiovascular: Normal rate and regular rhythm.   No murmur heard. Pulmonary/Chest: Effort normal and breath sounds normal. No respiratory distress. He has no wheezes. He has no rales. He exhibits no tenderness.  Abdominal: Soft. Bowel sounds are normal. He exhibits no distension and no mass. There is no tenderness. There is no rebound and no guarding.  Musculoskeletal: He exhibits  no edema.  Lymphadenopathy:    He has no cervical adenopathy.  Neurological: He is alert and oriented to person, place, and time. He has normal patellar reflexes. He exhibits normal muscle tone. Coordination normal.  Skin: Skin is warm and dry.  Psychiatric: He has a normal mood and affect. His behavior is normal. Judgment and thought content normal.           Assessment & Plan:   Preventative care- discussed healthy diet, exercise, weight loss. Pt counseled on importance of covid vaccine.  Pt adamantly declines. Also declines flu shot.  Tetanus up to date.  Anxiety- stable on  paxil and prn xanax.  Controlled substance contract is updated. Obtain UDS today.   Hyperlipidemia-maintained on atorvastatin. Obtain follow up lipid panel.  OSA- not currently using cpap. His insurance would not cover the machine. Offered referral to sleep specialist but he declines at this time.  This visit occurred during the SARS-CoV-2 public health emergency.  Safety protocols were in place, including screening questions prior to the visit, additional usage of staff PPE, and extensive cleaning of exam room while observing appropriate contact time as indicated for disinfecting solutions.           Assessment & Plan:

## 2020-06-30 NOTE — Patient Instructions (Addendum)
Please schedule routine vision and dental visits.  Try to work on healthy diet, exercise and weight loss.

## 2020-07-02 LAB — DRUG MONITORING, PANEL 8 WITH CONFIRMATION, URINE
6 Acetylmorphine: NEGATIVE ng/mL (ref <10)
Alcohol Metabolites: NEGATIVE ng/mL
Alphahydroxyalprazolam: 120 ng/mL — ABNORMAL HIGH (ref <25)
Alphahydroxymidazolam: NEGATIVE ng/mL (ref <50)
Alphahydroxytriazolam: NEGATIVE ng/mL (ref <50)
Aminoclonazepam: NEGATIVE ng/mL (ref <25)
Amphetamines: NEGATIVE ng/mL (ref <500)
Benzodiazepines: POSITIVE ng/mL — AB (ref <100)
Buprenorphine, Urine: NEGATIVE ng/mL (ref <5)
Cocaine Metabolite: NEGATIVE ng/mL (ref <150)
Creatinine: 215.6 mg/dL
Hydroxyethylflurazepam: NEGATIVE ng/mL (ref <50)
Lorazepam: NEGATIVE ng/mL (ref <50)
MDMA: NEGATIVE ng/mL (ref <500)
Marijuana Metabolite: NEGATIVE ng/mL (ref <20)
Nordiazepam: NEGATIVE ng/mL (ref <50)
Opiates: NEGATIVE ng/mL (ref <100)
Oxazepam: NEGATIVE ng/mL (ref <50)
Oxidant: NEGATIVE ug/mL
Oxycodone: NEGATIVE ng/mL (ref <100)
Temazepam: NEGATIVE ng/mL (ref <50)
pH: 6.2 (ref 4.5–9.0)

## 2020-07-02 LAB — DM TEMPLATE

## 2020-07-29 ENCOUNTER — Other Ambulatory Visit: Payer: Self-pay

## 2020-07-29 ENCOUNTER — Other Ambulatory Visit: Payer: Self-pay | Admitting: Family

## 2020-07-29 ENCOUNTER — Other Ambulatory Visit (HOSPITAL_COMMUNITY)
Admission: RE | Admit: 2020-07-29 | Discharge: 2020-07-29 | Disposition: A | Discharge status: Home / Self Care | Payer: No Typology Code available for payment source | Source: Ambulatory Visit | Attending: Family | Admitting: Family

## 2020-07-29 ENCOUNTER — Ambulatory Visit: Payer: No Typology Code available for payment source | Admitting: Medical

## 2020-07-29 ENCOUNTER — Ambulatory Visit: Payer: No Typology Code available for payment source | Admitting: Family

## 2020-07-29 ENCOUNTER — Encounter: Payer: Self-pay | Admitting: Family

## 2020-07-29 VITALS — BP 123/88 | HR 76 | Temp 98.5°F | Resp 16 | Ht 70.0 in | Wt 203.0 lb

## 2020-07-29 DIAGNOSIS — Z113 Encounter for screening for infections with a predominantly sexual mode of transmission: Secondary | ICD-10-CM | POA: Insufficient documentation

## 2020-07-29 DIAGNOSIS — B349 Viral infection, unspecified: Secondary | ICD-10-CM

## 2020-07-29 LAB — CBC WITH DIFFERENTIAL/PLATELET
Basophils Absolute: 0 10*3/uL (ref 0.0–0.1)
Basophils Relative: 0.5 % (ref 0.0–3.0)
Eosinophils Absolute: 0.1 10*3/uL (ref 0.0–0.7)
Eosinophils Relative: 1.5 % (ref 0.0–5.0)
HCT: 45.2 % (ref 39.0–52.0)
Hemoglobin: 15.7 g/dL (ref 13.0–17.0)
Lymphocytes Relative: 40.5 % (ref 12.0–46.0)
Lymphs Abs: 2.7 10*3/uL (ref 0.7–4.0)
MCHC: 34.8 g/dL (ref 30.0–36.0)
MCV: 90.8 fl (ref 78.0–100.0)
Monocytes Absolute: 0.4 10*3/uL (ref 0.1–1.0)
Monocytes Relative: 6.2 % (ref 3.0–12.0)
Neutro Abs: 3.4 10*3/uL (ref 1.4–7.7)
Neutrophils Relative %: 51.3 % (ref 43.0–77.0)
Platelets: 244 10*3/uL (ref 150.0–400.0)
RBC: 4.98 Mil/uL (ref 4.22–5.81)
RDW: 12.7 % (ref 11.5–15.5)
WBC: 6.6 10*3/uL (ref 4.0–10.5)

## 2020-07-29 LAB — COMPREHENSIVE METABOLIC PANEL
ALT: 26 U/L (ref 0–53)
AST: 18 U/L (ref 0–37)
Albumin: 4.7 g/dL (ref 3.5–5.2)
Alkaline Phosphatase: 70 U/L (ref 39–117)
BUN: 13 mg/dL (ref 6–23)
CO2: 27 mEq/L (ref 19–32)
Calcium: 10 mg/dL (ref 8.4–10.5)
Chloride: 103 mEq/L (ref 96–112)
Creatinine, Ser: 1.11 mg/dL (ref 0.40–1.50)
GFR: 83.55 mL/min (ref >60.00)
Glucose, Bld: 93 mg/dL (ref 70–99)
Potassium: 4.1 mEq/L (ref 3.5–5.1)
Sodium: 139 mEq/L (ref 135–145)
Total Bilirubin: 1 mg/dL (ref 0.2–1.2)
Total Protein: 7.1 g/dL (ref 6.0–8.3)

## 2020-07-29 MED ORDER — ONDANSETRON HCL 4 MG PO TABS: 4.0000 mg | ORAL_TABLET | Freq: Three times a day (TID) | ORAL | 0 refills | Status: DC | PRN

## 2020-07-29 MED FILL — ONDANSETRON HCL 4 MG TABLET: 4 | 6 days supply | Qty: 20 | Fill #0

## 2020-07-29 NOTE — Progress Notes (Signed)
Subjective:    Patient ID: KISHON GARRIGA, male    DOB: March 30, 1981, 39 y.o.   MRN: [id number]  HPI  Patient is a 39 yr old male who presents today with chief complaint of dizziness.   Fatigue x 1 week. Anorexia (eating a banana a day). Drinking water and powerade. This has caused increased anxiety. Tried to go to work and and had several dizzy spells.  He did eat something this AM.  He denies abdominal pain. He has had some nausea/dry heaves.  Had some chicken noodle soup last night and then had liquid diarrhea.  He reports that he has been having loose stools for a few days.  Reports yesterday he had 4-5 loose stools and no so far today (as of 9:15AM).  He denies nasal congestion, sore throat.  Had subjective fever on day one only.  He denies loss of taste or smell.   Review of Systems See HPI  Past Medical History:  Diagnosis Date  . Allergy   . Anxiety   . GERD (gastroesophageal reflux disease)   . Hyperlipemia   . Obstructive sleep apnea 01/18/2011     Social History   Socioeconomic History  . Marital status: Married    Spouse name: [name]  . Number of children: 2  . Years of education: Not on file  . Highest education level: Not on file  Occupational History  . 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: [employer]  . Occupation: Engineer, materials  Tobacco Use  . Smoking status: Former Smoker    Types: Cigarettes    Quit date: 11/11/2013    Years since quitting: 6.7  . Smokeless tobacco: Current User    Types: Snuff  Vaping Use  . Vaping Use: Never used  Substance and Sexual Activity  . Alcohol use: No    Alcohol/week: 0.0 standard drinks    Comment: 1-2 drinks a month  . Drug use: No  . Sexual activity: Yes    Partners: Female  Other Topics Concern  . 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 INGUINAL HERNIA REPAIR      Family History  Problem Relation Age of Onset  . Hypertension Paternal Grandfather   . Stroke Paternal Grandfather   . Prostate cancer Paternal Grandfather   . Heart disease Paternal Grandfather   . Colon cancer Maternal Grandfather 83  . Bladder Cancer Maternal Grandfather   . Skin cancer Maternal Grandfather   . Stroke Maternal Grandfather   . Kidney disease Maternal Grandmother   . Anxiety disorder Sister   . Stomach cancer Neg Hx     Allergies  Allergen Reactions  . Beef-Derived Products     Confirmed through allergy testing  . Corn-Containing Products     Confirmed through allergy testing  . Eggs Or Egg-Derived Products     Confirmed through allergy testing  . Milk-Related Compounds     Confirmed through allergy testing  . Peanut-Containing Drug Products  Confirmed through allergy testing  . Red Dye Hives  . Wheat     Confirmed through allergy testing    Current Outpatient Medications on File Prior to Visit  Medication Sig Dispense Refill  . ALPRAZolam (XANAX) 1 MG tablet One tab by mouth once daily as needed 30 tablet 2  . atorvastatin (LIPITOR) 40 MG tablet Take 1 tablet (40 mg total) by mouth daily. 90 tablet 1  . pantoprazole (PROTONIX) 40 MG tablet Take 1 tablet (40 mg total) by mouth daily. 90 tablet 1  . PARoxetine (PAXIL) 40 MG tablet Take 1 tablet (40 mg total) by mouth every morning. 90 tablet 1   No current facility-administered medications on file prior to visit.    BP 123/88 (BP Location: Left Arm, Patient Position: Sitting, Cuff Size: Small)   Pulse 76   Temp 98.5 F (36.9 C) (Temporal)   Resp 16   Ht 5\' 10"  (1.778 m)   Wt 203 lb (92.1 kg)    SpO2 100%   BMI 29.13 kg/m       Objective:   Physical Exam Constitutional:      General: He is not in acute distress.    Appearance: He is well-developed and well-nourished.  HENT:     Head: Normocephalic and atraumatic.  Cardiovascular:     Rate and Rhythm: Normal rate and regular rhythm.     Heart sounds: No murmur heard.   Pulmonary:     Effort: Pulmonary effort is normal. No respiratory distress.     Breath sounds: Normal breath sounds. No wheezing or rales.  Musculoskeletal:        General: No edema.  Skin:    General: Skin is warm and dry.  Neurological:     Mental Status: He is alert and oriented to person, place, and time.  Psychiatric:        Mood and Affect: Mood and affect normal.        Behavior: Behavior normal.        Thought Content: Thought content normal.           Assessment & Plan:  Viral illness- I advised pt that he should take an otc covid test.  He had covid 4 + months ago and we discussed that it is possible to become re-infected.  Discussed supportive measures including:  Rest, hydration, prn zofran for nausea and prn imodium for diarrhea. Will check CMET/CBC.  Overall it seems like his symptoms are improving.  He is advised to let me know if his symptoms do not continue to improve.  STD screening- pt is requesting std screening.  Orders placed.  This visit occurred during the SARS-CoV-2 public health emergency.  Safety protocols were in place, including screening questions prior to the visit, additional usage of staff PPE, and extensive cleaning of exam room while observing appropriate contact time as indicated for disinfecting solutions.

## 2020-07-29 NOTE — Patient Instructions (Signed)
Please complete lab work prior to leaving. You may use zofran as needed for nausea, immodium as needed for diarrhea.  Continue hydration, rest.  Call if symptoms do not continue to improve.

## 2020-07-30 LAB — URINE CYTOLOGY ANCILLARY ONLY
Chlamydia: NEGATIVE
Comment: NEGATIVE
Comment: NEGATIVE
Comment: NORMAL
Neisseria Gonorrhea: NEGATIVE
Trichomonas: NEGATIVE

## 2020-07-31 ENCOUNTER — Encounter: Payer: Self-pay | Admitting: Family

## 2020-07-31 LAB — RPR+HSVIGM+HBSAG+HSV2(IGG)+...
HIV Screen 4th Generation wRfx: NONREACTIVE
HSV 2 IgG, Type Spec: <0.91 index (ref 0.00–0.90)
HSVI/II Comb IgM: 1.23 Ratio — ABNORMAL HIGH (ref 0.00–0.90)
Hepatitis B Surface Ag: NEGATIVE
RPR Ser Ql: NONREACTIVE

## 2020-08-30 ENCOUNTER — Encounter: Payer: Self-pay | Admitting: Family

## 2020-08-30 ENCOUNTER — Other Ambulatory Visit: Payer: Self-pay | Admitting: Family

## 2020-08-30 MED ORDER — TADALAFIL 20 MG PO TABS: 10.0000 mg | ORAL_TABLET | Freq: Every day | ORAL | 3 refills | Status: DC | PRN

## 2020-09-01 MED FILL — TADALAFIL 20 MG TABS: 20 | 69 days supply | Qty: 18 | Fill #0

## 2020-09-01 MED FILL — ALPRAZolam 1 MG TABS: 1 | 30 days supply | Qty: 30 | Fill #1

## 2020-09-30 MED FILL — ALPRAZolam 1 MG TABS: 1 | 30 days supply | Qty: 30 | Fill #2

## 2020-10-23 MED FILL — PARoxetine HCL 40 MG TABS: 40 | 90 days supply | Qty: 90 | Fill #1

## 2020-10-23 MED FILL — ATORVASTATIN CALCIUM 40 MG: 40 | 90 days supply | Qty: 90 | Fill #1

## 2020-10-23 MED FILL — PANTOPRAZOLE SOD DR 40 MG T: 40 | 90 days supply | Qty: 90 | Fill #1

## 2020-11-06 ENCOUNTER — Other Ambulatory Visit: Payer: Self-pay | Admitting: Family

## 2020-11-06 NOTE — Telephone Encounter (Signed)
Requesting: alprazolam 1mg  Contract: 06/20/2019 UDS: 06/30/2020 Last Visit: 07/29/2020 Next Visit: None Last Refill: 06/30/2020 #30 and 2RF  Please Advise

## 2020-11-07 ENCOUNTER — Other Ambulatory Visit: Payer: Self-pay | Admitting: Family

## 2020-11-07 MED FILL — ALPRAZolam 1 MG TABS: 1 | 30 days supply | Qty: 30 | Fill #0

## 2020-11-15 ENCOUNTER — Other Ambulatory Visit (HOSPITAL_BASED_OUTPATIENT_CLINIC_OR_DEPARTMENT_OTHER): Payer: Self-pay

## 2020-11-21 ENCOUNTER — Other Ambulatory Visit (HOSPITAL_BASED_OUTPATIENT_CLINIC_OR_DEPARTMENT_OTHER): Payer: Self-pay

## 2020-11-21 MED FILL — Tadalafil Tab 20 MG: ORAL | 69 days supply | Qty: 18 | Fill #0 | Status: AC

## 2020-12-05 ENCOUNTER — Other Ambulatory Visit (HOSPITAL_BASED_OUTPATIENT_CLINIC_OR_DEPARTMENT_OTHER): Payer: Self-pay

## 2020-12-05 MED FILL — Alprazolam Tab 1 MG: ORAL | 30 days supply | Qty: 30 | Fill #0 | Status: AC

## 2021-01-04 MED FILL — Alprazolam Tab 1 MG: ORAL | 30 days supply | Qty: 30 | Fill #1 | Status: AC

## 2021-01-05 ENCOUNTER — Other Ambulatory Visit (HOSPITAL_BASED_OUTPATIENT_CLINIC_OR_DEPARTMENT_OTHER): Payer: Self-pay

## 2021-01-20 ENCOUNTER — Other Ambulatory Visit: Payer: Self-pay

## 2021-01-20 ENCOUNTER — Other Ambulatory Visit (HOSPITAL_BASED_OUTPATIENT_CLINIC_OR_DEPARTMENT_OTHER): Payer: Self-pay

## 2021-01-20 ENCOUNTER — Ambulatory Visit (INDEPENDENT_AMBULATORY_CARE_PROVIDER_SITE_OTHER): Payer: No Typology Code available for payment source | Admitting: Family

## 2021-01-20 ENCOUNTER — Encounter: Payer: Self-pay | Admitting: Family

## 2021-01-20 VITALS — BP 134/95 | HR 114 | Temp 98.7°F | Resp 16 | Ht 70.0 in | Wt 154.0 lb

## 2021-01-20 DIAGNOSIS — N489 Disorder of penis, unspecified: Secondary | ICD-10-CM | POA: Diagnosis not present

## 2021-01-20 DIAGNOSIS — F411 Generalized anxiety disorder: Secondary | ICD-10-CM | POA: Diagnosis not present

## 2021-01-20 DIAGNOSIS — R030 Elevated blood-pressure reading, without diagnosis of hypertension: Secondary | ICD-10-CM | POA: Diagnosis not present

## 2021-01-20 DIAGNOSIS — E663 Overweight: Secondary | ICD-10-CM

## 2021-01-20 MED ORDER — PAROXETINE HCL 40 MG PO TABS
ORAL_TABLET | ORAL | 1 refills | Status: DC
  Filled 2021-01-20: qty 90, 90d supply, fill #0
  Filled 2021-05-15: qty 90, 90d supply, fill #1

## 2021-01-20 MED ORDER — PANTOPRAZOLE SODIUM 40 MG PO TBEC
DELAYED_RELEASE_TABLET | Freq: Every day | ORAL | 1 refills | Status: DC
  Filled 2021-01-20: qty 90, 90d supply, fill #0
  Filled 2021-05-15: qty 90, 90d supply, fill #1

## 2021-01-20 MED ORDER — ATORVASTATIN CALCIUM 40 MG PO TABS
ORAL_TABLET | Freq: Every day | ORAL | 1 refills | Status: DC
  Filled 2021-01-20: qty 90, 90d supply, fill #0
  Filled 2021-05-15: qty 90, 90d supply, fill #1

## 2021-01-20 NOTE — Assessment & Plan Note (Signed)
His heart rate and blood pressure were noted to be elevated today, however he was extremely anxious at the time of his appointment.  I suspect these readings were elevated due to anxiety.  Plan to recheck at his upcoming physical.

## 2021-01-20 NOTE — Patient Instructions (Signed)
Great job losing weight!

## 2021-01-20 NOTE — Assessment & Plan Note (Signed)
Currently stable on Paxil 40 mg p.o. daily as well as as needed Xanax 1 mg.  Controlled substance contract is up-to-date as well as UDS.  Continue same.

## 2021-01-20 NOTE — Progress Notes (Signed)
Established Patient Office Visit  Subjective:  Patient ID: Brandon Jensen, male    DOB: [birth year]  Age: 40 y.o. MRN: [id number]  CC:  Chief Complaint  Patient presents with  . Cyst    Patient complains of penile growth.   . Anxiety    "doing well"     HPI Brandon Jensen presents today for follow up.  Anxiety- pt continues paxil 40mg . He also has xanax on hand for prn use.  Reports that eventually he would like to come off of paxil.  He wants to discuss this next visit as physical.  He feels as though the Paxil numbs his emotions.  He has been working very hard on , exercise, and weight loss.  He has lost 56 pounds since November. Wt Readings from Last 3 Encounters:  01/20/21 154 lb (69.9 kg)  07/29/20 203 lb (92.1 kg)  06/30/20 211 lb (95.7 kg)   GERD- maintained on protonix.  He reports resolution of his reflux symptoms.  Notes a cyst on his penis x 1 week.  Mildly painful.   ED- notes improvement with   Penile lesion-he reports that he has seen dermatology in the past for a cyst on his penis.  He reports that they removed the cyst but it has now come back.  Wt Readings from Last 3 Encounters:  01/20/21 154 lb (69.9 kg)  07/29/20 203 lb (92.1 kg)  06/30/20 211 lb (95.7 kg)     Past Medical History:  Diagnosis Date  . Allergy   . Anxiety   . GERD (gastroesophageal reflux disease)   . Hyperlipemia   . Obstructive sleep apnea 01/18/2011    Past Surgical History:  Procedure Laterality Date  . INGUINAL HERNIA REPAIR      Family History  Problem Relation Age of Onset  . Hypertension Paternal Grandfather   . Stroke Paternal Grandfather   . Prostate cancer Paternal Grandfather   . Heart disease Paternal Grandfather   . Colon cancer Maternal Grandfather 31  . Bladder Cancer Maternal Grandfather   . Skin cancer Maternal Grandfather   . Stroke Maternal Grandfather   . Kidney disease Maternal Grandmother   . Anxiety disorder Sister   . Stomach  cancer Neg Hx     Social History   Socioeconomic History  . Marital status: Divorced    Spouse name: 98  . Number of children: 2  . Years of education: Not on file  . Highest education level: Not on file  Occupational History  . 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 Occupation: Marine scientist  Tobacco Use  . Smoking status: Former Smoker    Types: Cigarettes    Quit date: 11/11/2013    Years since quitting: 7.1  . Smokeless tobacco: Current User    Types: Snuff  Vaping Use  . Vaping Use: Never used  Substance and Sexual Activity  . Alcohol use: No    Alcohol/week: 0.0 standard drinks    Comment: 1-2 drinks a month  . Drug use: No  . Sexual activity: Yes    Partners: Female  Other Topics Concern  . Not on file  Social History Narrative   Works at 11/13/2013, [employer]. 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 ALPRAZolam (XANAX) 1 MG tablet TAKE 1 TABLET BY MOUTH ONCE DAILY AS NEEDED 30 tablet 2  . atorvastatin (LIPITOR) 40 MG tablet TAKE 1 TABLET (40 MG TOTAL) BY MOUTH DAILY. 90 tablet 1  . ondansetron (ZOFRAN) 4 MG tablet TAKE 1 TABLET BY MOUTH EVERY 8 HOURS AS NEEDED FOR NAUSEA & VOMITING 20 tablet 0  . pantoprazole (PROTONIX) 40 MG tablet TAKE 1 TABLET (40 MG TOTAL) BY MOUTH DAILY. 90 tablet 1  . PARoxetine (PAXIL) 40 MG tablet TAKE 1 TABLET (40 MG TOTAL) BY MOUTH EVERY MORNING. 90 tablet 1  . tadalafil (CIALIS) 20 MG tablet TAKE 1/2-1 TABLET (10-20 MG TOTAL) BY MOUTH DAILY AS NEEDED FOR ERECTILE DYSFUNCTION. 20 tablet 3   No facility-administered medications  prior to visit.    Allergies  Allergen Reactions  . Beef-Derived Products     Confirmed through allergy testing  . Corn-Containing Products     Confirmed through allergy testing  . Eggs Or Egg-Derived Products     Confirmed through allergy testing  . Milk-Related Compounds     Confirmed through allergy testing  . Peanut-Containing Drug Products     Confirmed through allergy testing  . Red Dye Hives  . Wheat     Confirmed through allergy testing     Review of Systems    see HPI Objective:    Physical Exam Exam conducted with a chaperone present.  Constitutional:      Appearance: Normal appearance.  HENT:     Head: Normocephalic and atraumatic.  Genitourinary:    Comments: Patient is noted to have some redundant skin on the underside of his penis near the base. Neurological:     Mental Status: He is alert.     BP (!) 134/95 (BP Location: Right Arm, Patient Position: Sitting, Cuff Size: Small)   Pulse (!) 114   Temp 98.7 F (37.1 C) (Oral)   Resp 16   Ht 5\' 10"  (1.778 m)   Wt 154 lb (69.9 kg)   SpO2 100%   BMI 22.10 kg/m  Wt Readings from Last 3 Encounters:  01/20/21 154 lb (69.9 kg)  07/29/20 203 lb (92.1 kg)  06/30/20 211 lb (95.7 kg)     Health Maintenance Due  Topic Date Due  . Pneumococcal Vaccine 47-79 Years old (1 of 4 - PCV13) Never done  . URINE MICROALBUMIN  Never done  . Hepatitis C Screening  Never done    There are no preventive care reminders to display for this patient.  Lab Results  Component Value Date   TSH 1.89 01/28/2017   Lab Results  Component Value Date   WBC 6.6 07/29/2020   HGB 15.7 07/29/2020   HCT 45.2 07/29/2020   MCV 90.8 07/29/2020   PLT 244.0 07/29/2020   Lab Results  Component Value Date   NA 139 07/29/2020   K 4.1 07/29/2020   CO2 27 07/29/2020   GLUCOSE 93 07/29/2020   BUN 13 07/29/2020   CREATININE 1.11 07/29/2020   BILITOT 1.0 07/29/2020   ALKPHOS 70 07/29/2020   AST 18 07/29/2020   ALT 26  07/29/2020   PROT 7.1 07/29/2020   ALBUMIN 4.7 07/29/2020   CALCIUM 10.0 07/29/2020   GFR 83.55 07/29/2020   Lab Results  Component Value  Date   CHOL 164 06/30/2020   Lab Results  Component Value Date   HDL 49.30 06/30/2020   Lab Results  Component Value Date   LDLCALC 98 06/30/2020   Lab Results  Component Value Date   TRIG 84.0 06/30/2020   Lab Results  Component Value Date   CHOLHDL 3 06/30/2020   No results found for: HGBA1C    Assessment & Plan:   Problem List Items Addressed This Visit   None     No orders of the defined types were placed in this encounter.   Follow-up: No follow-ups on file.    Lemont Fillers, NP

## 2021-01-20 NOTE — Assessment & Plan Note (Signed)
This is resolved.  I commended patient on his hard work and weight loss.

## 2021-01-20 NOTE — Assessment & Plan Note (Signed)
Reassurance provided.  It does not appear to be a wart or an ulcer.  He would like the extra skin removed.  He is requesting referral to dermatology.  He is already established with [facility] across the street.

## 2021-01-26 ENCOUNTER — Other Ambulatory Visit (HOSPITAL_BASED_OUTPATIENT_CLINIC_OR_DEPARTMENT_OTHER): Payer: Self-pay

## 2021-01-29 ENCOUNTER — Other Ambulatory Visit: Payer: Self-pay | Admitting: Family

## 2021-01-29 NOTE — Telephone Encounter (Signed)
Last RX:  11-07-2020 Last OV:  01-20-2021 Next OV: none scheduled UDS:  06-30-2020 CSC:  06-30-2020

## 2021-01-30 ENCOUNTER — Other Ambulatory Visit (HOSPITAL_BASED_OUTPATIENT_CLINIC_OR_DEPARTMENT_OTHER): Payer: Self-pay

## 2021-01-30 MED ORDER — ALPRAZOLAM 1 MG PO TABS
ORAL_TABLET | Freq: Every day | ORAL | 2 refills | Status: DC | PRN
  Filled 2021-01-30: qty 30, fill #0
  Filled 2021-02-03: qty 30, 30d supply, fill #0
  Filled 2021-03-04: qty 30, 30d supply, fill #1
  Filled 2021-04-06: qty 30, 30d supply, fill #2

## 2021-02-03 ENCOUNTER — Other Ambulatory Visit (HOSPITAL_BASED_OUTPATIENT_CLINIC_OR_DEPARTMENT_OTHER): Payer: Self-pay

## 2021-03-05 ENCOUNTER — Other Ambulatory Visit (HOSPITAL_BASED_OUTPATIENT_CLINIC_OR_DEPARTMENT_OTHER): Payer: Self-pay

## 2021-03-17 MED FILL — Tadalafil Tab 20 MG: ORAL | 69 days supply | Qty: 18 | Fill #1 | Status: AC

## 2021-03-18 ENCOUNTER — Other Ambulatory Visit (HOSPITAL_BASED_OUTPATIENT_CLINIC_OR_DEPARTMENT_OTHER): Payer: Self-pay

## 2021-04-06 ENCOUNTER — Other Ambulatory Visit (HOSPITAL_BASED_OUTPATIENT_CLINIC_OR_DEPARTMENT_OTHER): Payer: Self-pay

## 2021-05-05 ENCOUNTER — Other Ambulatory Visit (HOSPITAL_BASED_OUTPATIENT_CLINIC_OR_DEPARTMENT_OTHER): Payer: Self-pay

## 2021-05-05 ENCOUNTER — Other Ambulatory Visit: Payer: Self-pay | Admitting: Family

## 2021-05-05 MED ORDER — ALPRAZOLAM 1 MG PO TABS
ORAL_TABLET | Freq: Every day | ORAL | 0 refills | Status: DC | PRN
  Filled 2021-05-05: qty 30, 30d supply, fill #0

## 2021-05-05 NOTE — Telephone Encounter (Signed)
Requesting: alprazolam 1mg  Contract: 06/20/2019 UDS: 06/30/2020 Last Visit: 01/20/2021 Next Visit: None Last Refill: 01/30/2021 #30 and 0RF  Please Advise

## 2021-05-05 NOTE — Telephone Encounter (Signed)
Refill sent. I would like him to update his contract. Please leave a copy for him to come by and sign before his next refill.

## 2021-05-06 NOTE — Telephone Encounter (Signed)
Also advised contract will be with front desk staff and no appointment needed for this.

## 2021-05-06 NOTE — Telephone Encounter (Signed)
Called patient but no answer, lvm for patient to be aware he needs to come in to renew his contract for xanax to avoid delays on his next refills.

## 2021-05-15 ENCOUNTER — Other Ambulatory Visit (HOSPITAL_BASED_OUTPATIENT_CLINIC_OR_DEPARTMENT_OTHER): Payer: Self-pay

## 2021-05-25 ENCOUNTER — Other Ambulatory Visit (HOSPITAL_BASED_OUTPATIENT_CLINIC_OR_DEPARTMENT_OTHER): Payer: Self-pay

## 2021-05-25 MED FILL — Tadalafil Tab 20 MG: ORAL | 69 days supply | Qty: 18 | Fill #2 | Status: AC

## 2021-06-02 ENCOUNTER — Other Ambulatory Visit: Payer: Self-pay | Admitting: Family

## 2021-06-02 ENCOUNTER — Other Ambulatory Visit (HOSPITAL_BASED_OUTPATIENT_CLINIC_OR_DEPARTMENT_OTHER): Payer: Self-pay

## 2021-06-02 MED ORDER — ALPRAZOLAM 1 MG PO TABS
ORAL_TABLET | Freq: Every day | ORAL | 0 refills | Status: DC | PRN
  Filled 2021-06-02: qty 30, 30d supply, fill #0

## 2021-06-30 ENCOUNTER — Other Ambulatory Visit: Payer: Self-pay | Admitting: Family

## 2021-06-30 ENCOUNTER — Other Ambulatory Visit (HOSPITAL_BASED_OUTPATIENT_CLINIC_OR_DEPARTMENT_OTHER): Payer: Self-pay

## 2021-06-30 MED ORDER — ALPRAZOLAM 1 MG PO TABS
ORAL_TABLET | Freq: Every day | ORAL | 0 refills | Status: DC | PRN
  Filled 2021-06-30: qty 30, 30d supply, fill #0

## 2021-07-29 ENCOUNTER — Other Ambulatory Visit: Payer: Self-pay | Admitting: Family

## 2021-07-29 ENCOUNTER — Other Ambulatory Visit (HOSPITAL_BASED_OUTPATIENT_CLINIC_OR_DEPARTMENT_OTHER): Payer: Self-pay

## 2021-07-29 MED ORDER — ALPRAZOLAM 1 MG PO TABS
ORAL_TABLET | Freq: Every day | ORAL | 0 refills | Status: DC | PRN
  Filled 2021-07-29: qty 30, 30d supply, fill #0

## 2021-07-31 ENCOUNTER — Encounter: Payer: Self-pay | Admitting: Family

## 2021-07-31 ENCOUNTER — Other Ambulatory Visit (HOSPITAL_BASED_OUTPATIENT_CLINIC_OR_DEPARTMENT_OTHER): Payer: Self-pay

## 2021-07-31 ENCOUNTER — Ambulatory Visit (INDEPENDENT_AMBULATORY_CARE_PROVIDER_SITE_OTHER): Payer: No Typology Code available for payment source | Admitting: Family

## 2021-07-31 VITALS — BP 125/84 | HR 74 | Temp 98.0°F | Resp 16 | Ht 70.0 in | Wt 156.0 lb

## 2021-07-31 DIAGNOSIS — F411 Generalized anxiety disorder: Secondary | ICD-10-CM

## 2021-07-31 DIAGNOSIS — K219 Gastro-esophageal reflux disease without esophagitis: Secondary | ICD-10-CM | POA: Diagnosis not present

## 2021-07-31 DIAGNOSIS — E782 Mixed hyperlipidemia: Secondary | ICD-10-CM

## 2021-07-31 DIAGNOSIS — Z0000 Encounter for general adult medical examination without abnormal findings: Secondary | ICD-10-CM | POA: Diagnosis not present

## 2021-07-31 DIAGNOSIS — R030 Elevated blood-pressure reading, without diagnosis of hypertension: Secondary | ICD-10-CM | POA: Diagnosis not present

## 2021-07-31 DIAGNOSIS — G4733 Obstructive sleep apnea (adult) (pediatric): Secondary | ICD-10-CM

## 2021-07-31 DIAGNOSIS — F419 Anxiety disorder, unspecified: Secondary | ICD-10-CM | POA: Diagnosis not present

## 2021-07-31 DIAGNOSIS — Z720 Tobacco use: Secondary | ICD-10-CM

## 2021-07-31 DIAGNOSIS — N489 Disorder of penis, unspecified: Secondary | ICD-10-CM

## 2021-07-31 LAB — COMPREHENSIVE METABOLIC PANEL
ALT: 50 U/L (ref 0–53)
AST: 23 U/L (ref 0–37)
Albumin: 4.6 g/dL (ref 3.5–5.2)
Alkaline Phosphatase: 63 U/L (ref 39–117)
BUN: 13 mg/dL (ref 6–23)
CO2: 29 mEq/L (ref 19–32)
Calcium: 9.7 mg/dL (ref 8.4–10.5)
Chloride: 102 mEq/L (ref 96–112)
Creatinine, Ser: 0.94 mg/dL (ref 0.40–1.50)
GFR: 101.28 mL/min (ref >60.00)
Glucose, Bld: 79 mg/dL (ref 70–99)
Potassium: 4.4 mEq/L (ref 3.5–5.1)
Sodium: 139 mEq/L (ref 135–145)
Total Bilirubin: 1.4 mg/dL — ABNORMAL HIGH (ref 0.2–1.2)
Total Protein: 6.8 g/dL (ref 6.0–8.3)

## 2021-07-31 LAB — LIPID PANEL
Cholesterol: 121 mg/dL (ref 0–200)
HDL: 48.1 mg/dL (ref >39.00)
LDL Cholesterol: 59 mg/dL (ref 0–99)
NonHDL: 73.09
Total CHOL/HDL Ratio: 3
Triglycerides: 69 mg/dL (ref 0.0–149.0)
VLDL: 13.8 mg/dL (ref 0.0–40.0)

## 2021-07-31 MED ORDER — PAROXETINE HCL 40 MG PO TABS
ORAL_TABLET | ORAL | 1 refills | Status: DC
  Filled 2021-07-31: qty 90, 90d supply, fill #0
  Filled 2021-08-26: qty 90, fill #0
  Filled 2021-08-28: qty 90, 90d supply, fill #0
  Filled 2021-12-03: qty 90, 90d supply, fill #1

## 2021-07-31 MED ORDER — ALPRAZOLAM 1 MG PO TABS
ORAL_TABLET | Freq: Every day | ORAL | 0 refills | Status: DC | PRN
  Filled 2021-07-31: qty 30, fill #0

## 2021-07-31 MED ORDER — ATORVASTATIN CALCIUM 40 MG PO TABS
ORAL_TABLET | Freq: Every day | ORAL | 1 refills | Status: DC
  Filled 2021-07-31 – 2021-08-28 (×3): qty 90, 90d supply, fill #0
  Filled 2021-12-03: qty 90, 90d supply, fill #1

## 2021-07-31 MED ORDER — ALPRAZOLAM 1 MG PO TABS
1.0000 mg | ORAL_TABLET | Freq: Every day | ORAL | 0 refills | Status: DC | PRN
  Filled 2021-07-31: qty 30, 30d supply, fill #0

## 2021-07-31 MED ORDER — PANTOPRAZOLE SODIUM 40 MG PO TBEC
DELAYED_RELEASE_TABLET | Freq: Every day | ORAL | 1 refills | Status: DC
  Filled 2021-07-31 – 2021-08-28 (×3): qty 90, 90d supply, fill #0
  Filled 2021-12-03: qty 90, 90d supply, fill #1

## 2021-07-31 MED ORDER — TADALAFIL 20 MG PO TABS
ORAL_TABLET | ORAL | 3 refills | Status: DC
  Filled 2021-07-31: qty 20, 20d supply, fill #0
  Filled 2021-08-28: qty 18, 69d supply, fill #0
  Filled 2021-12-04: qty 20, 40d supply, fill #0

## 2021-07-31 NOTE — Assessment & Plan Note (Signed)
Will obtain follow up lipid panel.  

## 2021-07-31 NOTE — Assessment & Plan Note (Signed)
I suggested that he try cutting pantoprazole in half once daily and see how he does. If no recurrence of gerd symptoms he can d/c. Hopefully he won't need it given his recent weight loss.

## 2021-07-31 NOTE — Progress Notes (Signed)
Subjective:   By signing my name below, I, Lyric Barr-McArthur, attest that this documentation has been prepared under the direction and in the presence of Debbrah Alar, NP, 07/31/2021     Patient ID: Brandon Jensen, male    DOB: Jul 21, 1981, 40 y.o.   MRN: [id number]  Chief Complaint  Patient presents with   Annual Exam         HPI Patient is in today for a comprehensive physical exam.   He denies any fever, unexpected weight change, adenopathy, rash, hearing loss, ear pain, rhinorrhea, visual disturbances, eye pain, chest pain, leg swelling, cough, nausea, vomitting, diarrhea, blood in stool, dysuria, frequency, myalgias, arthralgias, headaches, or depression. Sleep Apnea: He has expressed concern in the past about going back on a CPAP machine due to a history of sleep apnea. Today he denies any issues regarding sleep and is not interested in going back on a CPAP machine or having an at home sleep study performed.  Blood pressure: He has had a history of high blood pressure readings. His blood pressure is within good range today which is likely contributed to his recent weight loss.  BP Readings from Last 3 Encounters:  07/31/21 125/84  01/20/21 (!) 134/95  07/29/20 123/88  Lesion: He went to see dermatology and they resolved his lesion. He notes that he experienced pain for a while after having it resolved but it has since caused him no concerns.  Anxiety: He reports that his anxiety is manageable at this time and notes he uses 1 mg xanax once a day. He expresses no concerns regarding depression.  Immunizations: He is UTD on tetanus. He is not interested in getting any Covid-19 vaccines or a flu shot at this time.  Diet: He maintains a healthy diet.  Exercise: He exercises regularly. He has lost a large amount of weight recently due to his use of healthy diet and exercise and he is maintaining to keep this weight off.  Alcohol: He reports rare alcohol use.  Drugs: He denies  any drug use.  Tobacco: He notes that he has quit chewing tobacco.  Vision: He is UTD on vision care. He reports that he recently started using contacts.  SHx: He denies having any new surgeries in the last year.  FMHx: His father was recently diagnosed with heart disease. He reports this runs in his family. His father just had a triple bypass performed.   Health Maintenance Due  Topic Date Due   Pneumococcal Vaccine 41-41 Years old (1 - PCV) Never done   URINE MICROALBUMIN  Never done   Hepatitis C Screening  Never done    Past Medical History:  Diagnosis Date   Allergy    Anxiety    GERD (gastroesophageal reflux disease)    Hyperlipemia    Obstructive sleep apnea 01/18/2011    Past Surgical History:  Procedure Laterality Date   INGUINAL HERNIA REPAIR      Family History  Problem Relation Age of Onset   CAD Father 50       s/p cabg x 3   Anxiety disorder Sister    Kidney disease Maternal Grandmother    Colon cancer Maternal Grandfather 88   Bladder Cancer Maternal Grandfather    Skin cancer Maternal Grandfather    Stroke Maternal Grandfather    Hypertension Paternal Grandfather    Stroke Paternal Grandfather    Prostate cancer Paternal Grandfather    Heart disease Paternal Grandfather    Stomach cancer Neg  Hx     Social History   Socioeconomic History   Marital status: Divorced    Spouse name: Bethany   Number of children: 2   Years of education: Not on file   Highest education level: Not on file  Occupational History   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 90 tablet 1   pantoprazole (PROTONIX) 40 MG tablet TAKE 1 TABLET (40 MG TOTAL) BY MOUTH DAILY. 90 tablet 1   PARoxetine (PAXIL) 40 MG tablet TAKE 1 TABLET (40 MG TOTAL) BY MOUTH EVERY MORNING. 90 tablet 1   tadalafil (CIALIS) 20 MG tablet TAKE 1/2-1 TABLET (10-20 MG TOTAL) BY MOUTH DAILY AS NEEDED FOR ERECTILE DYSFUNCTION. 20 tablet 3   No facility-administered medications prior to visit.    Allergies  Allergen Reactions   Beef-Derived Products     Confirmed through allergy testing   Corn-Containing Products     Confirmed through allergy testing   Eggs Or Egg-Derived Products     Confirmed through allergy testing   Milk-Related Compounds     Confirmed through allergy testing   Peanut-Containing Drug Products     Confirmed through allergy testing   Red Dye Hives   Wheat     Confirmed through allergy testing    Review of Systems  Constitutional:  Negative for fever.       (-) unexpected weight changes (-) adenopathy   HENT:  Negative for ear pain and hearing loss.        (-) rhinorrhea  Eyes:  Negative for pain.       (-) visual disturbances   Respiratory:  Negative for cough.   Cardiovascular:  Negative for chest pain and leg swelling.  Gastrointestinal:  Negative for  blood in stool, diarrhea, nausea and vomiting.  Genitourinary:  Negative for dysuria and frequency.  Musculoskeletal:  Negative for joint pain and myalgias.  Skin:  Negative for rash.  Neurological:  Negative for headaches.  Psychiatric/Behavioral:  Negative for depression. The patient is nervous/anxious (resolved with use of 1 mg xanax).       Objective:    Physical Exam Constitutional:      General:  He is not in acute distress.    Appearance: Normal appearance. He is not ill-appearing.  HENT:     Head: Normocephalic and atraumatic.     Right Ear: Tympanic membrane, ear canal and external ear normal.     Left Ear: Tympanic membrane, ear canal and external ear normal.  Eyes:     Extraocular Movements: Extraocular movements intact.     Pupils: Pupils are equal, round, and reactive to light.     Comments: (-) nystagmus   Cardiovascular:     Rate and Rhythm: Normal rate and regular rhythm.     Heart sounds: Normal heart sounds. No murmur heard.   No gallop.  Pulmonary:     Effort: Pulmonary effort is normal. No respiratory distress.     Breath sounds: Normal breath sounds. No wheezing or rales.  Abdominal:     General: Bowel sounds are normal.     Palpations: Abdomen is soft.     Tenderness: There is no abdominal tenderness.  Musculoskeletal:     Comments: (+) 5/5 upper and lower extremity strength  Skin:    General: Skin is warm and dry.  Neurological:     Mental Status: He is alert and oriented to person, place, and time.     Deep Tendon Reflexes:     Reflex Scores:      Patellar reflexes are 2+ on the right side and 2+ on the left side. Psychiatric:        Behavior: Behavior normal.        Judgment: Judgment normal.    BP 125/84 (BP Location: Right Arm, Patient Position: Sitting, Cuff Size: Small)    Pulse 74    Temp 98 F (36.7 C) (Oral)    Resp 16    Ht 5' 10"  (1.778 m)    Wt 156 lb (70.8 kg)    SpO2 100%    BMI 22.38 kg/m  Wt Readings from Last 3 Encounters:   07/31/21 156 lb (70.8 kg)  01/20/21 154 lb (69.9 kg)  07/29/20 203 lb (92.1 kg)       Assessment & Plan:   Problem List Items Addressed This Visit       Unprioritized   Routine general medical examination at a health care facility - Primary    Wt Readings from Last 3 Encounters:  07/31/21 156 lb (70.8 kg)  01/20/21 154 lb (69.9 kg)  07/29/20 203 lb (92.1 kg)  Encouraged pt to continue healthy diet, exercise, weight maintenance.  Declines flu shot and covid vaccination. Tdap up to dat until 2024.         Penile lesion    Reports that this was removed by dermatology and is stable.       Obstructive sleep apnea    States he no longer snores since he lost 50+ pounds.  Offered home sleep study to confirm resolution of OSA.  He declines at this time. "I feel really good."       Nicotine abuse    Using oral nicotine patches. Pt is counseled on importance of discontinuing.      MIXED HYPERLIPIDEMIA    Will obtain follow up lipid panel.       Relevant Medications   atorvastatin (LIPITOR) 40 MG tablet   tadalafil (CIALIS) 20 MG tablet   Other Relevant Orders   Comp Met (CMET)   Lipid panel   GERD    I suggested that he try cutting pantoprazole in half once daily and see  how he does. If no recurrence of gerd symptoms he can d/c. Hopefully he won't need it given his recent weight loss.       Relevant Medications   pantoprazole (PROTONIX) 40 MG tablet   Elevated blood pressure reading    BP Readings from Last 3 Encounters:  07/31/21 125/84  01/20/21 (!) 134/95  07/29/20 123/88  BP looks great following weight loss.  Monitor.       Anxiety state    Overall stable on paxil.  Uses xanax once daily.       Relevant Medications   PARoxetine (PAXIL) 40 MG tablet   ALPRAZolam (XANAX) 1 MG tablet   Other Visit Diagnoses     Anxiety       Relevant Medications   PARoxetine (PAXIL) 40 MG tablet   ALPRAZolam (XANAX) 1 MG tablet   Other Relevant Orders   DRUG  MONITORING, PANEL 8 WITH CONFIRMATION, URINE      Meds ordered this encounter  Medications   atorvastatin (LIPITOR) 40 MG tablet    Sig: TAKE 1 TABLET (40 MG TOTAL) BY MOUTH DAILY.    Dispense:  90 tablet    Refill:  1    Please place rx on file. Pt will pick up at a later date.    Order Specific Question:   Supervising Provider    Answer:   Penni Homans A [[id number]]   pantoprazole (PROTONIX) 40 MG tablet    Sig: TAKE 1 TABLET (40 MG TOTAL) BY MOUTH DAILY.    Dispense:  90 tablet    Refill:  1    Please place rx on file. Pt will pick up at a later date.    Order Specific Question:   Supervising Provider    Answer:   Penni Homans A [[id number]]   PARoxetine (PAXIL) 40 MG tablet    Sig: TAKE 1 TABLET (40 MG TOTAL) BY MOUTH EVERY MORNING.    Dispense:  90 tablet    Refill:  1    Please place rx on file. Pt will pick up at a later date.    Order Specific Question:   Supervising Provider    Answer:   Penni Homans A [[id number]]   tadalafil (CIALIS) 20 MG tablet    Sig: TAKE 1/2-1 TABLET (10-20 MG TOTAL) BY MOUTH DAILY AS NEEDED FOR ERECTILE DYSFUNCTION.    Dispense:  20 tablet    Refill:  3    Please place rx on file. Pt will pick up at a later date.    Order Specific Question:   Supervising Provider    Answer:   Penni Homans A [[id number]]   ALPRAZolam (XANAX) 1 MG tablet    Sig: TAKE 1 TABLET BY MOUTH ONCE DAILY AS NEEDED    Dispense:  30 tablet    Refill:  0    Do not fill prior to 08/28/20    Order Specific Question:   Supervising Provider    Answer:   Penni Homans A [[id number]]    I, Debbrah Alar, NP, personally preformed the services described in this documentation.  All medical record entries made by the scribe were at my direction and in my presence.  I have reviewed the chart and discharge instructions (if applicable) and agree that the record reflects my personal performance and is accurate and complete. 07/31/2021  I,Lyric Barr-McArthur,acting as a scribe for Nance Pear,  NP.,have documented all relevant documentation on the behalf of Nance Pear, NP,as directed by  Nance Pear, NP while in the presence of Nance Pear, NP.  Nance Pear, NP

## 2021-07-31 NOTE — Assessment & Plan Note (Signed)
Using oral nicotine patches. Pt is counseled on importance of discontinuing.

## 2021-07-31 NOTE — Assessment & Plan Note (Addendum)
Wt Readings from Last 3 Encounters:  07/31/21 156 lb (70.8 kg)  01/20/21 154 lb (69.9 kg)  07/29/20 203 lb (92.1 kg)   Encouraged pt to continue healthy diet, exercise, weight maintenance.  Declines flu shot and covid vaccination. Tdap up to dat until 2024.

## 2021-07-31 NOTE — Assessment & Plan Note (Signed)
>>  ASSESSMENT AND PLAN FOR ROUTINE GENERAL MEDICAL EXAMINATION AT A HEALTH CARE FACILITY WRITTEN ON 07/31/2021  7:50 AM BY O'SULLIVAN, Bryannah Boston, NP  Wt Readings from Last 3 Encounters:  07/31/21 156 lb (70.8 kg)  01/20/21 154 lb (69.9 kg)  07/29/20 203 lb (92.1 kg)   Encouraged pt to continue healthy diet, exercise, weight maintenance.  Declines flu shot and covid vaccination. Tdap up to dat until 2024.

## 2021-07-31 NOTE — Addendum Note (Signed)
Addended by: [name] on: 07/31/2021 07:59 AM   Modules accepted: Orders

## 2021-07-31 NOTE — Assessment & Plan Note (Addendum)
BP Readings from Last 3 Encounters:  07/31/21 125/84  01/20/21 (!) 134/95  07/29/20 123/88   BP looks great following weight loss.  Monitor.

## 2021-07-31 NOTE — Assessment & Plan Note (Signed)
Reports that this was removed by dermatology and is stable.

## 2021-07-31 NOTE — Assessment & Plan Note (Signed)
Overall stable on paxil.  Uses xanax once daily.

## 2021-07-31 NOTE — Assessment & Plan Note (Signed)
States he no longer snores since he lost 50+ pounds.  Offered home sleep study to confirm resolution of OSA.  He declines at this time. "I feel really good."

## 2021-08-03 LAB — DRUG MONITORING, PANEL 8 WITH CONFIRMATION, URINE
6 Acetylmorphine: NEGATIVE ng/mL (ref <10)
Alcohol Metabolites: NEGATIVE ng/mL (ref <500)
Alphahydroxyalprazolam: 296 ng/mL — ABNORMAL HIGH (ref <25)
Alphahydroxymidazolam: NEGATIVE ng/mL (ref <50)
Alphahydroxytriazolam: NEGATIVE ng/mL (ref <50)
Aminoclonazepam: NEGATIVE ng/mL (ref <25)
Amphetamines: NEGATIVE ng/mL (ref <500)
Benzodiazepines: POSITIVE ng/mL — AB (ref <100)
Buprenorphine, Urine: NEGATIVE ng/mL (ref <5)
Cocaine Metabolite: NEGATIVE ng/mL (ref <150)
Creatinine: 111.6 mg/dL (ref >=20.0)
Hydroxyethylflurazepam: NEGATIVE ng/mL (ref <50)
Lorazepam: NEGATIVE ng/mL (ref <50)
MDMA: NEGATIVE ng/mL (ref <500)
Marijuana Metabolite: NEGATIVE ng/mL (ref <20)
Nordiazepam: NEGATIVE ng/mL (ref <50)
Opiates: NEGATIVE ng/mL (ref <100)
Oxazepam: NEGATIVE ng/mL (ref <50)
Oxidant: NEGATIVE ug/mL (ref <200)
Oxycodone: NEGATIVE ng/mL (ref <100)
Temazepam: NEGATIVE ng/mL (ref <50)
pH: 6.1 (ref 4.5–9.0)

## 2021-08-03 LAB — DM TEMPLATE

## 2021-08-06 ENCOUNTER — Other Ambulatory Visit (HOSPITAL_BASED_OUTPATIENT_CLINIC_OR_DEPARTMENT_OTHER): Payer: Self-pay

## 2021-08-26 ENCOUNTER — Other Ambulatory Visit: Payer: Self-pay | Admitting: Family

## 2021-08-26 ENCOUNTER — Other Ambulatory Visit (HOSPITAL_BASED_OUTPATIENT_CLINIC_OR_DEPARTMENT_OTHER): Payer: Self-pay

## 2021-08-26 MED ORDER — ALPRAZOLAM 1 MG PO TABS
1.0000 mg | ORAL_TABLET | Freq: Every day | ORAL | 0 refills | Status: DC | PRN
  Filled 2021-08-26 – 2021-08-27 (×2): qty 30, 30d supply, fill #0

## 2021-08-27 ENCOUNTER — Other Ambulatory Visit (HOSPITAL_BASED_OUTPATIENT_CLINIC_OR_DEPARTMENT_OTHER): Payer: Self-pay

## 2021-08-28 ENCOUNTER — Other Ambulatory Visit (HOSPITAL_BASED_OUTPATIENT_CLINIC_OR_DEPARTMENT_OTHER): Payer: Self-pay

## 2021-09-24 ENCOUNTER — Other Ambulatory Visit (HOSPITAL_BASED_OUTPATIENT_CLINIC_OR_DEPARTMENT_OTHER): Payer: Self-pay

## 2021-09-24 ENCOUNTER — Other Ambulatory Visit: Payer: Self-pay | Admitting: Family

## 2021-09-24 MED ORDER — ALPRAZOLAM 1 MG PO TABS
1.0000 mg | ORAL_TABLET | Freq: Every day | ORAL | 0 refills | Status: DC | PRN
  Filled 2021-09-24: qty 30, 30d supply, fill #0

## 2021-09-24 NOTE — Telephone Encounter (Signed)
Requesting: alprazolam 1mg   Contract: 06/20/2019 UDS: 07/31/2021 Last Visit: 07/31/2021 Next Visit: None Last Refill: 08/28/2021 #30 and 0RF  Please Advise

## 2021-11-03 ENCOUNTER — Other Ambulatory Visit: Payer: Self-pay | Admitting: Family

## 2021-11-03 ENCOUNTER — Other Ambulatory Visit (HOSPITAL_BASED_OUTPATIENT_CLINIC_OR_DEPARTMENT_OTHER): Payer: Self-pay

## 2021-11-03 MED ORDER — ALPRAZOLAM 1 MG PO TABS
1.0000 mg | ORAL_TABLET | Freq: Every day | ORAL | 0 refills | Status: DC | PRN
  Filled 2021-11-03: qty 30, 30d supply, fill #0

## 2021-11-03 NOTE — Telephone Encounter (Signed)
Last RX:  09-24-2021 ?Last OV:  07-31-2021 ?Next OV:No future appointments ?UDS:  07-31-2021 ?CSC:  05-18-2021 ?

## 2021-12-03 ENCOUNTER — Other Ambulatory Visit: Payer: Self-pay | Admitting: Family

## 2021-12-03 ENCOUNTER — Other Ambulatory Visit (HOSPITAL_BASED_OUTPATIENT_CLINIC_OR_DEPARTMENT_OTHER): Payer: Self-pay

## 2021-12-03 MED ORDER — ALPRAZOLAM 1 MG PO TABS
1.0000 mg | ORAL_TABLET | Freq: Every day | ORAL | 0 refills | Status: DC | PRN
  Filled 2021-12-03: qty 30, 30d supply, fill #0

## 2021-12-03 NOTE — Telephone Encounter (Signed)
Requesting: alprazolam ?Contract: 05/18/21 ?UDS:07/31/21 ?Last Visit:07/31/21 ?Next Visit: none ?Last Refill: 11/03/21 ? ?Please Advise ? ?

## 2021-12-04 ENCOUNTER — Other Ambulatory Visit (HOSPITAL_BASED_OUTPATIENT_CLINIC_OR_DEPARTMENT_OTHER): Payer: Self-pay

## 2022-01-01 ENCOUNTER — Other Ambulatory Visit (HOSPITAL_BASED_OUTPATIENT_CLINIC_OR_DEPARTMENT_OTHER): Payer: Self-pay

## 2022-01-01 ENCOUNTER — Other Ambulatory Visit: Payer: Self-pay | Admitting: Family

## 2022-01-01 MED ORDER — ALPRAZOLAM 1 MG PO TABS
1.0000 mg | ORAL_TABLET | Freq: Every day | ORAL | 0 refills | Status: DC | PRN
  Filled 2022-01-01: qty 30, 30d supply, fill #0

## 2022-01-01 NOTE — Telephone Encounter (Signed)
Requesting: alprazolam Contract: 05/18/21 UDS: 07/21/21 Last Visit: 07/31/21 Next Visit: none Last Refill: 12/03/21  Please Advise

## 2022-01-06 ENCOUNTER — Other Ambulatory Visit: Payer: Self-pay

## 2022-01-06 ENCOUNTER — Encounter (HOSPITAL_BASED_OUTPATIENT_CLINIC_OR_DEPARTMENT_OTHER): Payer: Self-pay | Admitting: Emergency Medicine

## 2022-01-06 ENCOUNTER — Inpatient Hospital Stay (HOSPITAL_BASED_OUTPATIENT_CLINIC_OR_DEPARTMENT_OTHER)
Admission: EM | Admit: 2022-01-06 | Discharge: 2022-01-10 | DRG: 558 | Disposition: A | Discharge status: Home / Self Care | Payer: 59 | Attending: Internal Medicine | Admitting: Internal Medicine

## 2022-01-06 DIAGNOSIS — Z808 Family history of malignant neoplasm of other organs or systems: Secondary | ICD-10-CM | POA: Diagnosis not present

## 2022-01-06 DIAGNOSIS — Z91012 Allergy to eggs: Secondary | ICD-10-CM | POA: Diagnosis not present

## 2022-01-06 DIAGNOSIS — E782 Mixed hyperlipidemia: Secondary | ICD-10-CM | POA: Diagnosis not present

## 2022-01-06 DIAGNOSIS — Z8249 Family history of ischemic heart disease and other diseases of the circulatory system: Secondary | ICD-10-CM

## 2022-01-06 DIAGNOSIS — Z91014 Allergy to mammalian meats: Secondary | ICD-10-CM

## 2022-01-06 DIAGNOSIS — Z823 Family history of stroke: Secondary | ICD-10-CM | POA: Diagnosis not present

## 2022-01-06 DIAGNOSIS — Z87891 Personal history of nicotine dependence: Secondary | ICD-10-CM | POA: Diagnosis not present

## 2022-01-06 DIAGNOSIS — Z91011 Allergy to milk products: Secondary | ICD-10-CM

## 2022-01-06 DIAGNOSIS — Z91010 Allergy to peanuts: Secondary | ICD-10-CM

## 2022-01-06 DIAGNOSIS — R7989 Other specified abnormal findings of blood chemistry: Secondary | ICD-10-CM | POA: Diagnosis present

## 2022-01-06 DIAGNOSIS — Z79899 Other long term (current) drug therapy: Secondary | ICD-10-CM

## 2022-01-06 DIAGNOSIS — Z8042 Family history of malignant neoplasm of prostate: Secondary | ICD-10-CM | POA: Diagnosis not present

## 2022-01-06 DIAGNOSIS — Z800 Family history of malignant neoplasm of digestive organs: Secondary | ICD-10-CM

## 2022-01-06 DIAGNOSIS — M6282 Rhabdomyolysis: Principal | ICD-10-CM | POA: Diagnosis present

## 2022-01-06 DIAGNOSIS — Z818 Family history of other mental and behavioral disorders: Secondary | ICD-10-CM | POA: Diagnosis not present

## 2022-01-06 DIAGNOSIS — F419 Anxiety disorder, unspecified: Secondary | ICD-10-CM | POA: Diagnosis present

## 2022-01-06 DIAGNOSIS — Z888 Allergy status to other drugs, medicaments and biological substances status: Secondary | ICD-10-CM

## 2022-01-06 DIAGNOSIS — Z91018 Allergy to other foods: Secondary | ICD-10-CM

## 2022-01-06 DIAGNOSIS — Z8052 Family history of malignant neoplasm of bladder: Secondary | ICD-10-CM

## 2022-01-06 DIAGNOSIS — M791 Myalgia, unspecified site: Secondary | ICD-10-CM | POA: Diagnosis present

## 2022-01-06 DIAGNOSIS — E785 Hyperlipidemia, unspecified: Secondary | ICD-10-CM | POA: Diagnosis present

## 2022-01-06 HISTORY — DX: Rhabdomyolysis: M62.82

## 2022-01-06 LAB — COMPREHENSIVE METABOLIC PANEL
ALT: 161 U/L — ABNORMAL HIGH (ref 0–44)
AST: 741 U/L — ABNORMAL HIGH (ref 15–41)
Albumin: 4.2 g/dL (ref 3.5–5.0)
Alkaline Phosphatase: 70 U/L (ref 38–126)
Anion gap: 8 (ref 5–15)
BUN: 14 mg/dL (ref 6–20)
CO2: 27 mmol/L (ref 22–32)
Calcium: 9.4 mg/dL (ref 8.9–10.3)
Chloride: 104 mmol/L (ref 98–111)
Creatinine, Ser: 0.82 mg/dL (ref 0.61–1.24)
GFR, Estimated: >60 mL/min (ref >60)
Glucose, Bld: 89 mg/dL (ref 70–99)
Potassium: 4.4 mmol/L (ref 3.5–5.1)
Sodium: 139 mmol/L (ref 135–145)
Total Bilirubin: 1.4 mg/dL — ABNORMAL HIGH (ref 0.3–1.2)
Total Protein: 7.1 g/dL (ref 6.5–8.1)

## 2022-01-06 LAB — URINALYSIS, ROUTINE W REFLEX MICROSCOPIC
Bilirubin Urine: NEGATIVE
Glucose, UA: NEGATIVE mg/dL
Ketones, ur: NEGATIVE mg/dL
Leukocytes,Ua: NEGATIVE
Nitrite: NEGATIVE
Protein, ur: 30 mg/dL — AB
Specific Gravity, Urine: 1.02 (ref 1.005–1.030)
pH: 7 (ref 5.0–8.0)

## 2022-01-06 LAB — CBC WITH DIFFERENTIAL/PLATELET
Abs Immature Granulocytes: 0.01 10*3/uL (ref 0.00–0.07)
Basophils Absolute: 0.1 10*3/uL (ref 0.0–0.1)
Basophils Relative: 1 %
Eosinophils Absolute: 0.2 10*3/uL (ref 0.0–0.5)
Eosinophils Relative: 3 %
HCT: 41.7 % (ref 39.0–52.0)
Hemoglobin: 15.1 g/dL (ref 13.0–17.0)
Immature Granulocytes: 0 %
Lymphocytes Relative: 36 %
Lymphs Abs: 2.3 10*3/uL (ref 0.7–4.0)
MCH: 32.1 pg (ref 26.0–34.0)
MCHC: 36.2 g/dL — ABNORMAL HIGH (ref 30.0–36.0)
MCV: 88.5 fL (ref 80.0–100.0)
Monocytes Absolute: 0.5 10*3/uL (ref 0.1–1.0)
Monocytes Relative: 8 %
Neutro Abs: 3.4 10*3/uL (ref 1.7–7.7)
Neutrophils Relative %: 52 %
Platelets: 203 10*3/uL (ref 150–400)
RBC: 4.71 MIL/uL (ref 4.22–5.81)
RDW: 11.8 % (ref 11.5–15.5)
WBC: 6.5 10*3/uL (ref 4.0–10.5)
nRBC: 0 % (ref 0.0–0.2)

## 2022-01-06 LAB — CK: Total CK: 48945 U/L — ABNORMAL HIGH (ref 49–397)

## 2022-01-06 LAB — URINALYSIS, MICROSCOPIC (REFLEX): WBC, UA: NONE SEEN WBC/hpf (ref 0–5)

## 2022-01-06 MED ORDER — SODIUM CHLORIDE 0.9 % IV BOLUS
2000.0000 mL | Freq: Once | INTRAVENOUS | Status: AC
Start: 2022-01-06 — End: 2022-01-06
  Administered 2022-01-06: 2000 mL via INTRAVENOUS

## 2022-01-06 MED ORDER — SODIUM CHLORIDE 0.9 % IV BOLUS
1000.0000 mL | Freq: Once | INTRAVENOUS | Status: AC
  Administered 2022-01-06: 1000 mL via INTRAVENOUS

## 2022-01-06 NOTE — H&P (Signed)
History and Physical    Brandon Jensen EXB:[id number] DOB: March 26, 1981 DOA: 01/06/2022  PCP: Debbrah Alar, NP  Patient coming from: Home  I have personally briefly reviewed patient's old medical records in [place]  Chief Complaint: Muscle pain  HPI: Brandon Jensen is a 41 y.o. male with medical history significant for HLD, anxiety who presented to the ED for evaluation of muscle pain in his legs.  Patient states he restarted doing an intense leg workout 2 days ago which he has not done for several months.  He developed soreness in the muscle of his upper thighs bilaterally.  Muscle soreness has been progressing and he has had some trouble walking due to pain.  Earlier today he noticed that his urine was dark tea colored therefore sought medical attention.  Initially went to urgent care and was rerouted to the [facility] ED.  [place] ED Course  Labs/Imaging on admission: I have personally reviewed following labs and imaging studies.  Initial vitals showed BP 128/91, pulse 81, RR 18, temp 98.5 F, SPO2 100% on room air.  Labs show CK 48,945, sodium 139, potassium 4.4, bicarb 27, BUN 14, creatinine 0.82, serum glucose 89, AST 741, ALT 161, alk phos 70, total bilirubin 1.4, WBC 6.5, hemoglobin 15.1, platelets 203,000.  Urinalysis shows negative nitrates, negative leukocytes, 0-5 RBC/hpf, no WBCs.  Patient was given 3 L normal saline and the hospitalist service was consulted to admit for further evaluation and management.  Review of Systems: All systems reviewed and are negative except as documented in history of present illness above.   Past Medical History:  Diagnosis Date   Allergy    Anxiety    GERD (gastroesophageal reflux disease)    Hyperlipemia    Obstructive sleep apnea 01/18/2011    Past Surgical History:  Procedure Laterality Date   INGUINAL HERNIA REPAIR      Social History:  reports that he quit smoking about 8 years ago.  His smoking use included cigarettes. His smokeless tobacco use includes snuff. He reports that he does not drink alcohol and does not use drugs.  Allergies  Allergen Reactions   Beef-Derived Products     Confirmed through allergy testing   Corn-Containing Products     Confirmed through allergy testing   Eggs Or Egg-Derived Products     Confirmed through allergy testing   Milk-Related Compounds     Confirmed through allergy testing   Peanut-Containing Drug Products     Confirmed through allergy testing   Red Dye Hives   Wheat     Confirmed through allergy testing    Family History  Problem Relation Age of Onset   CAD Father 58       s/p cabg x 3   Anxiety disorder Sister    Kidney disease Maternal Grandmother    Colon cancer Maternal Grandfather 88   Bladder Cancer Maternal Grandfather    Skin cancer Maternal Grandfather    Stroke Maternal Grandfather    Hypertension Paternal Grandfather    Stroke Paternal Grandfather    Prostate cancer Paternal Grandfather    Heart disease Paternal Grandfather    Stomach cancer Neg Hx      Prior to Admission medications   Medication Sig Start Date End Date Taking? Authorizing Provider  ALPRAZolam Duanne Moron) 1 MG tablet Take 1 tablet (1 mg total) by mouth daily as needed for anxiety. 01/01/22   Debbrah Alar, NP  atorvastatin (LIPITOR) 40 MG tablet TAKE 1  TABLET (40 MG TOTAL) BY MOUTH DAILY. 07/31/21 07/31/22  Debbrah Alar, NP  pantoprazole (PROTONIX) 40 MG tablet TAKE 1 TABLET (40 MG TOTAL) BY MOUTH DAILY. 07/31/21 07/31/22  Debbrah Alar, NP  PARoxetine (PAXIL) 40 MG tablet TAKE 1 TABLET (40 MG TOTAL) BY MOUTH EVERY MORNING. 07/31/21 07/31/22  Debbrah Alar, NP  tadalafil (CIALIS) 20 MG tablet TAKE 1/2-1 TABLET (10-20 MG TOTAL) BY MOUTH DAILY AS NEEDED FOR ERECTILE DYSFUNCTION. 07/31/21 07/31/22  Debbrah Alar, NP    Physical Exam: Vitals:   01/06/22 1855 01/06/22 1858 01/06/22 2218 01/06/22 2354  BP:  (!)  128/91 127/82 120/82  Pulse:  81 72 71  Resp:  _0 Temp:  98.5 F (36.9 C) 98.5 F (36.9 C) 97.6 F (36.4 C)  TempSrc:  Oral Oral Oral  SpO2:  100% 100% 100%  Weight: 70.3 kg     Height: _1  (1.778 m)      Constitutional: Resting in bed with head elevated, NAD, calm, comfortable Eyes: EOMI, lids and conjunctivae normal ENMT: Mucous membranes are dry. Posterior pharynx clear of any exudate or lesions.Normal dentition.  Neck: normal, supple, no masses. Respiratory: clear to auscultation bilaterally, no wheezing, no crackles. Normal respiratory effort. No accessory muscle use.  Cardiovascular: Regular rate and rhythm, no murmurs / rubs / gallops. No extremity edema. 2+ pedal pulses. Abdomen: no tenderness, no masses palpated. No hepatosplenomegaly.  Musculoskeletal: no clubbing / cyanosis. No joint deformity upper and lower extremities. Good ROM, no contractures. Normal muscle tone.  Skin: no rashes, lesions, ulcers. No induration Neurologic: Sensation intact. Strength 5/5 in all 4.  Psychiatric: Normal judgment and insight. Alert and oriented x 3.   EKG: Not performed.  Assessment/Plan Principal Problem:   Rhabdomyolysis Active Problems:   Elevated LFTs   Mixed hyperlipidemia   Anxiety   Brandon Jensen is a 41 y.o. male with medical history significant for HLD, anxiety who is admitted with rhabdomyolysis.  Assessment and Plan: * Rhabdomyolysis Exercise-induced rhabdomyolysis with CK >48,000.  S/p 3 L normal saline in ED. -Continue IV NS_2  mL/hour overnight -Follow CK, renal function, UOP  Elevated LFTs AST 741, ALT 161, total bilirubin 1.4.  Suspect secondary to rhabdomyolysis.  Continue IV fluid hydration, hold statin, repeat labs in AM.  Anxiety Continue home Paxil and Xanax.  Mixed hyperlipidemia Hold atorvastatin.  DVT prophylaxis: enoxaparin (LOVENOX) injection 40 mg Start: 01/07/22 1000 Code Status: Full code, confirmed with patient on  admission Family Communication: Discussed with patient's significant other at bedside Disposition Plan: From home and likely discharge to home pending clinical progress Consults called: None Severity of Illness: The appropriate patient status for this patient is OBSERVATION. Observation status is judged to be reasonable and necessary in order to provide the required intensity of service to ensure the patient's safety. The patient's presenting symptoms, physical exam findings, and initial radiographic and laboratory data in the context of their medical condition is felt to place them at decreased risk for further clinical deterioration. Furthermore, it is anticipated that the patient will be medically stable for discharge from the hospital within 2 midnights of admission.   Zada Finders MD [facility]  If 7PM-7AM, please contact night-coverage [email or web address]  01/07/2022, 12:25 AM

## 2022-01-06 NOTE — ED Provider Notes (Signed)
[facility] EMERGENCY DEPARTMENT Provider Note   CSN: [id number] Arrival date & time: 01/06/22  1848     History  Chief Complaint  Patient presents with   Muscle Pain    Brandon Jensen is a 41 y.o. male history of high cholesterol, here presenting with muscle pain.  Patient states that he tried to get back into shape and was doing some leg workout 2 days ago.  He states that after the third rep, he became very sore.  He states that yesterday, his muscle soreness was increased. He states that he has trouble walking due to pain. Denies any trauma or injury or abdominal pain.  States that his urine is darker than usual.  Denies any drug use.  The history is provided by the patient.      Home Medications Prior to Admission medications   Medication Sig Start Date End Date Taking? Authorizing Provider  ALPRAZolam Prudy Feeler) 1 MG tablet Take 1 tablet (1 mg total) by mouth daily as needed for anxiety. 01/01/22   [name], NP  atorvastatin (LIPITOR) 40 MG tablet TAKE 1 TABLET (40 MG TOTAL) BY MOUTH DAILY. 07/31/21 07/31/22  [name], NP  pantoprazole (PROTONIX) 40 MG tablet TAKE 1 TABLET (40 MG TOTAL) BY MOUTH DAILY. 07/31/21 07/31/22  [name], NP  PARoxetine (PAXIL) 40 MG tablet TAKE 1 TABLET (40 MG TOTAL) BY MOUTH EVERY MORNING. 07/31/21 07/31/22  [name], NP  tadalafil (CIALIS) 20 MG tablet TAKE 1/2-1 TABLET (10-20 MG TOTAL) BY MOUTH DAILY AS NEEDED FOR ERECTILE DYSFUNCTION. 07/31/21 07/31/22  [name], NP      Allergies    Beef-derived products, Corn-containing products, Eggs or egg-derived products, Milk-related compounds, Peanut-containing drug products, Red dye, and Wheat    Review of Systems   Review of Systems  Musculoskeletal:        Leg pain   All other systems reviewed and are negative.  Physical Exam Updated Vital Signs BP (!) 128/91 (BP Location: Right Arm)   Pulse 81   Temp 98.5 F (36.9 C) (Oral)    Resp 18   Ht 5\' 10"  (1.778 m)   Wt 70.3 kg   SpO2 100%   BMI 22.24 kg/m  Physical Exam Vitals and nursing note reviewed.  Constitutional:      Comments: Slightly dehydrated and uncomfortable  HENT:     Head: Normocephalic.     Nose: Nose normal.     Mouth/Throat:     Mouth: Mucous membranes are dry.  Eyes:     Extraocular Movements: Extraocular movements intact.     Pupils: Pupils are equal, round, and reactive to light.  Cardiovascular:     Rate and Rhythm: Normal rate and regular rhythm.     Pulses: Normal pulses.     Heart sounds: Normal heart sounds.  Pulmonary:     Effort: Pulmonary effort is normal.     Breath sounds: Normal breath sounds.  Abdominal:     General: Abdomen is flat.     Palpations: Abdomen is soft.  Musculoskeletal:     Cervical back: Normal range of motion and neck supple.     Comments: Diffuse thigh and calf tenderness.  No edema.  Skin:    General: Skin is warm.     Capillary Refill: Capillary refill takes less than 2 seconds.  Neurological:     General: No focal deficit present.     Mental Status: He is oriented to person, place, and time.  Psychiatric:  Mood and Affect: Mood normal.        Behavior: Behavior normal.    ED Results / Procedures / Treatments   Labs (all labs ordered are listed, but only abnormal results are displayed) Labs Reviewed  URINALYSIS, ROUTINE W REFLEX MICROSCOPIC - Abnormal; Notable for the following components:      Result Value   Hgb urine dipstick LARGE (*)    Protein, ur 30 (*)    All other components within normal limits  CBC WITH DIFFERENTIAL/PLATELET - Abnormal; Notable for the following components:   MCHC 36.2 (*)    All other components within normal limits  COMPREHENSIVE METABOLIC PANEL - Abnormal; Notable for the following components:   AST 741 (*)    ALT 161 (*)    Total Bilirubin 1.4 (*)    All other components within normal limits  CK - Abnormal; Notable for the following components:    Total CK 48,945 (*)    All other components within normal limits  URINALYSIS, MICROSCOPIC (REFLEX) - Abnormal; Notable for the following components:   Bacteria, UA RARE (*)    All other components within normal limits    EKG None  Radiology No results found.  Procedures Procedures    CRITICAL CARE Performed by: Richardean Canal   Total critical care time: 30 minutes  Critical care time was exclusive of separately billable procedures and treating other patients.  Critical care was necessary to treat or prevent imminent or life-threatening deterioration.  Critical care was time spent personally by me on the following activities: development of treatment plan with patient and/or surrogate as well as nursing, discussions with consultants, evaluation of patient's response to treatment, examination of patient, obtaining history from patient or surrogate, ordering and performing treatments and interventions, ordering and review of laboratory studies, ordering and review of radiographic studies, pulse oximetry and re-evaluation of patient's condition.   Medications Ordered in ED Medications  sodium chloride 0.9 % bolus 1,000 mL (0 mLs Intravenous Stopped 01/06/22 2132)  sodium chloride 0.9 % bolus 2,000 mL (2,000 mLs Intravenous New Bag/Given 01/06/22 2132)    ED Course/ Medical Decision Making/ A&P                           Medical Decision Making Brandon Jensen is a 40 y.o. male here with leg cramps after workout.  Concern for possible rhabdomyolysis.  Plan to get CBC and CMP and CK level and urinalysis.  We will hydrate and reassess.  9:42 PM I reviewed patient's labs.  Patient CK level is 48,000.  Patient's AST is 740 and ALT is 161 likely from rhabdo. Patient's creatinine is normal.  I ordered 3 L normal saline bolus.  At this point, hospitalist to admit for rhabdomyolysis    Amount and/or Complexity of Data Reviewed Labs: ordered. Decision-making details documented in ED  Course.  Risk Decision regarding hospitalization.   Final Clinical Impression(s) / ED Diagnoses Final diagnoses:  None    Rx / DC Orders ED Discharge Orders     None         Charlynne Pander, MD 01/06/22 2143

## 2022-01-06 NOTE — ED Triage Notes (Signed)
Pt reports muscle pain (legs) x 2d after a workout; now reports dark urine as well

## 2022-01-07 DIAGNOSIS — F419 Anxiety disorder, unspecified: Secondary | ICD-10-CM | POA: Diagnosis present

## 2022-01-07 DIAGNOSIS — Z91011 Allergy to milk products: Secondary | ICD-10-CM | POA: Diagnosis not present

## 2022-01-07 DIAGNOSIS — Z808 Family history of malignant neoplasm of other organs or systems: Secondary | ICD-10-CM | POA: Diagnosis not present

## 2022-01-07 DIAGNOSIS — E782 Mixed hyperlipidemia: Secondary | ICD-10-CM

## 2022-01-07 DIAGNOSIS — M6282 Rhabdomyolysis: Secondary | ICD-10-CM | POA: Diagnosis present

## 2022-01-07 DIAGNOSIS — Z79899 Other long term (current) drug therapy: Secondary | ICD-10-CM | POA: Diagnosis not present

## 2022-01-07 DIAGNOSIS — M791 Myalgia, unspecified site: Secondary | ICD-10-CM | POA: Diagnosis present

## 2022-01-07 DIAGNOSIS — R7989 Other specified abnormal findings of blood chemistry: Secondary | ICD-10-CM

## 2022-01-07 DIAGNOSIS — Z8052 Family history of malignant neoplasm of bladder: Secondary | ICD-10-CM | POA: Diagnosis not present

## 2022-01-07 DIAGNOSIS — Z8249 Family history of ischemic heart disease and other diseases of the circulatory system: Secondary | ICD-10-CM | POA: Diagnosis not present

## 2022-01-07 DIAGNOSIS — Z818 Family history of other mental and behavioral disorders: Secondary | ICD-10-CM | POA: Diagnosis not present

## 2022-01-07 DIAGNOSIS — Z800 Family history of malignant neoplasm of digestive organs: Secondary | ICD-10-CM | POA: Diagnosis not present

## 2022-01-07 DIAGNOSIS — Z91018 Allergy to other foods: Secondary | ICD-10-CM | POA: Diagnosis not present

## 2022-01-07 DIAGNOSIS — Z8042 Family history of malignant neoplasm of prostate: Secondary | ICD-10-CM | POA: Diagnosis not present

## 2022-01-07 DIAGNOSIS — Z87891 Personal history of nicotine dependence: Secondary | ICD-10-CM | POA: Diagnosis not present

## 2022-01-07 DIAGNOSIS — Z91010 Allergy to peanuts: Secondary | ICD-10-CM | POA: Diagnosis not present

## 2022-01-07 DIAGNOSIS — Z823 Family history of stroke: Secondary | ICD-10-CM | POA: Diagnosis not present

## 2022-01-07 DIAGNOSIS — Z91014 Allergy to mammalian meats: Secondary | ICD-10-CM | POA: Diagnosis not present

## 2022-01-07 DIAGNOSIS — Z888 Allergy status to other drugs, medicaments and biological substances status: Secondary | ICD-10-CM | POA: Diagnosis not present

## 2022-01-07 DIAGNOSIS — Z91012 Allergy to eggs: Secondary | ICD-10-CM | POA: Diagnosis not present

## 2022-01-07 LAB — COMPREHENSIVE METABOLIC PANEL
ALT: 161 U/L — ABNORMAL HIGH (ref 0–44)
AST: 731 U/L — ABNORMAL HIGH (ref 15–41)
Albumin: 3.9 g/dL (ref 3.5–5.0)
Alkaline Phosphatase: 62 U/L (ref 38–126)
Anion gap: 4 — ABNORMAL LOW (ref 5–15)
BUN: 13 mg/dL (ref 6–20)
CO2: 27 mmol/L (ref 22–32)
Calcium: 8.4 mg/dL — ABNORMAL LOW (ref 8.9–10.3)
Chloride: 109 mmol/L (ref 98–111)
Creatinine, Ser: 0.81 mg/dL (ref 0.61–1.24)
GFR, Estimated: >60 mL/min (ref >60)
Glucose, Bld: 105 mg/dL — ABNORMAL HIGH (ref 70–99)
Potassium: 3.9 mmol/L (ref 3.5–5.1)
Sodium: 140 mmol/L (ref 135–145)
Total Bilirubin: 1 mg/dL (ref 0.3–1.2)
Total Protein: 6.3 g/dL — ABNORMAL LOW (ref 6.5–8.1)

## 2022-01-07 LAB — CBC
HCT: 41 % (ref 39.0–52.0)
Hemoglobin: 14.2 g/dL (ref 13.0–17.0)
MCH: 31.6 pg (ref 26.0–34.0)
MCHC: 34.6 g/dL (ref 30.0–36.0)
MCV: 91.1 fL (ref 80.0–100.0)
Platelets: 191 10*3/uL (ref 150–400)
RBC: 4.5 MIL/uL (ref 4.22–5.81)
RDW: 11.9 % (ref 11.5–15.5)
WBC: 6.5 10*3/uL (ref 4.0–10.5)
nRBC: 0 % (ref 0.0–0.2)

## 2022-01-07 LAB — HIV ANTIBODY (ROUTINE TESTING W REFLEX): HIV Screen 4th Generation wRfx: NONREACTIVE

## 2022-01-07 LAB — CK: Total CK: 48825 U/L — ABNORMAL HIGH (ref 49–397)

## 2022-01-07 MED ORDER — LACTATED RINGERS IV BOLUS
1000.0000 mL | INTRAVENOUS | Status: AC
  Administered 2022-01-07 (×2): 1000 mL via INTRAVENOUS

## 2022-01-07 MED ORDER — SODIUM CHLORIDE 0.9 % IV SOLN: INTRAVENOUS | Status: DC

## 2022-01-07 MED ORDER — ENOXAPARIN SODIUM 40 MG/0.4ML IJ SOSY
40.0000 mg | PREFILLED_SYRINGE | INTRAMUSCULAR | Status: DC
  Administered 2022-01-07 – 2022-01-10 (×4): 40 mg via SUBCUTANEOUS
  Filled 2022-01-07 (×4): qty 0.4

## 2022-01-07 MED ORDER — PAROXETINE HCL 20 MG PO TABS
40.0000 mg | ORAL_TABLET | Freq: Every day | ORAL | Status: DC
  Administered 2022-01-07 – 2022-01-09 (×4): 40 mg via ORAL
  Filled 2022-01-07: qty 4
  Filled 2022-01-07 (×2): qty 2
  Filled 2022-01-07 (×2): qty 4

## 2022-01-07 MED ORDER — PROMETHAZINE HCL 25 MG PO TABS: 12.5000 mg | ORAL_TABLET | Freq: Four times a day (QID) | ORAL | Status: DC | PRN

## 2022-01-07 MED ORDER — IBUPROFEN 400 MG PO TABS
600.0000 mg | ORAL_TABLET | Freq: Four times a day (QID) | ORAL | Status: DC | PRN
  Administered 2022-01-07: 600 mg via ORAL
  Filled 2022-01-07: qty 1

## 2022-01-07 MED ORDER — SODIUM CHLORIDE 0.9 % IV SOLN: INTRAVENOUS | Status: AC

## 2022-01-07 MED ORDER — ALPRAZOLAM 1 MG PO TABS
1.0000 mg | ORAL_TABLET | Freq: Every day | ORAL | Status: DC | PRN
  Administered 2022-01-07: 1 mg via ORAL
  Filled 2022-01-07 (×2): qty 1

## 2022-01-07 MED ORDER — ALPRAZOLAM 1 MG PO TABS
1.0000 mg | ORAL_TABLET | Freq: Every day | ORAL | Status: DC | PRN
  Administered 2022-01-07 – 2022-01-09 (×3): 1 mg via ORAL
  Filled 2022-01-07 (×3): qty 1

## 2022-01-07 NOTE — Assessment & Plan Note (Signed)
-   Hold atorvastatin 

## 2022-01-07 NOTE — Assessment & Plan Note (Signed)
Exercise-induced rhabdomyolysis with CK >48,000.  S/p 3 L normal saline in ED. -Continue IV NS@150  mL/hour overnight -Follow CK, renal function, UOP

## 2022-01-07 NOTE — Assessment & Plan Note (Signed)
-   Continue home Paxil and Xanax 

## 2022-01-07 NOTE — Assessment & Plan Note (Signed)
AST 741, ALT 161, total bilirubin 1.4.  Suspect secondary to rhabdomyolysis.  Continue IV fluid hydration, hold statin, repeat labs in AM.

## 2022-01-07 NOTE — Progress Notes (Signed)
PROGRESS NOTE    Brandon Jensen  VHQ:[id number] DOB: 02-25-1981 DOA: 01/06/2022 PCP: [name], NP    Brief Narrative:  41 year old male, came to the hospital with complaints of muscle pain in his legs, change in color of urine.  Found to have rhabdomyolysis after heavy workout routine.  CK greater than 48,000.  Admitted for IV fluids.   Assessment & Plan:   Principal Problem:   Rhabdomyolysis Active Problems:   Elevated LFTs   Mixed hyperlipidemia   Anxiety   Rhabdomyolysis -Secondary to heavy workout routine -CK currently remains elevated greater than 48,000 -Creatinine is currently stable -After 3 L of urine output today -Recheck labs in a.m. -He is chronically on statin, but has not had any changes in dosage -Check TSH  Elevated liver enzymes -Suspect related to rhabdo -No abdominal symptoms  Anxiety -Continue home dose Paxil and Xanax  Hyperlipidemia -Statin currently on hold   DVT prophylaxis: enoxaparin (LOVENOX) injection 40 mg Start: 01/07/22 1000  Code Status: full code Family Communication: significant other at bedside Disposition Plan: Status is: Inpatient Remains inpatient appropriate because: continued elevation of CK, needing IV fluids     Consultants:    Procedures:    Antimicrobials:      Subjective: Feels that pain in his legs is improving.  No new complaints.  Objective: Vitals:   01/06/22 2354 01/07/22 0350 01/07/22 0815 01/07/22 1318  BP: 120/82 126/89 (!) 138/92 113/77  Pulse: 71 72 (!) 59 62  Resp: 17 17 18 15   Temp: 97.6 F (36.4 C) 99 F (37.2 C) 97.8 F (36.6 C) 97.7 F (36.5 C)  TempSrc: Oral Oral Oral Oral  SpO2: 100% 100% 100% 100%  Weight:      Height:        Intake/Output Summary (Last 24 hours) at 01/07/2022 1742 Last data filed at 01/07/2022 1712 Gross per 24 hour  Intake 4845.96 ml  Output 4280 ml  Net 565.96 ml   Filed Weights   01/06/22 1855  Weight: 70.3 kg     Examination:  General exam: Appears calm and comfortable  Respiratory system: Clear to auscultation. Respiratory effort normal. Cardiovascular system: S1 & S2 heard, RRR. No JVD, murmurs, rubs, gallops or clicks. No pedal edema. Gastrointestinal system: Abdomen is nondistended, soft and nontender. No organomegaly or masses felt. Normal bowel sounds heard. Central nervous system: Alert and oriented. No focal neurological deficits. Extremities: Symmetric 5 x 5 power. Skin: No rashes, lesions or ulcers Psychiatry: Judgement and insight appear normal. Mood & affect appropriate.     Data Reviewed: I have personally reviewed following labs and imaging studies  CBC: Recent Labs  Lab 01/06/22 1904 01/07/22 0329  WBC 6.5 6.5  NEUTROABS 3.4  --   HGB 15.1 14.2  HCT 41.7 41.0  MCV 88.5 91.1  PLT 203 191   Basic Metabolic Panel: Recent Labs  Lab 01/06/22 1904 01/07/22 0329  NA 139 140  K 4.4 3.9  CL 104 109  CO2 27 27  GLUCOSE 89 105*  BUN 14 13  CREATININE 0.82 0.81  CALCIUM 9.4 8.4*   GFR: Estimated Creatinine Clearance: 119.3 mL/min (by C-G formula based on SCr of 0.81 mg/dL). Liver Function Tests: Recent Labs  Lab 01/06/22 1904 01/07/22 0329  AST 741* 731*  ALT 161* 161*  ALKPHOS 70 62  BILITOT 1.4* 1.0  PROT 7.1 6.3*  ALBUMIN 4.2 3.9   No results for input(s): LIPASE, AMYLASE in the last 168 hours. No results for input(s): AMMONIA  in the last 168 hours. Coagulation Profile: No results for input(s): INR, PROTIME in the last 168 hours. Cardiac Enzymes: Recent Labs  Lab 01/06/22 1904 01/07/22 0329  CKTOTAL 48,945* 18,299*   BNP (last 3 results) No results for input(s): PROBNP in the last 8760 hours. HbA1C: No results for input(s): HGBA1C in the last 72 hours. CBG: No results for input(s): GLUCAP in the last 168 hours. Lipid Profile: No results for input(s): CHOL, HDL, LDLCALC, TRIG, CHOLHDL, LDLDIRECT in the last 72 hours. Thyroid Function  Tests: No results for input(s): TSH, T4TOTAL, FREET4, T3FREE, THYROIDAB in the last 72 hours. Anemia Panel: No results for input(s): VITAMINB12, FOLATE, FERRITIN, TIBC, IRON, RETICCTPCT in the last 72 hours. Sepsis Labs: No results for input(s): PROCALCITON, LATICACIDVEN in the last 168 hours.  No results found for this or any previous visit (from the past 240 hour(s)).       Radiology Studies: No results found.      Scheduled Meds:  enoxaparin (LOVENOX) injection  40 mg Subcutaneous Q24H   PARoxetine  40 mg Oral QHS   Continuous Infusions:  sodium chloride 150 mL/hr at 01/07/22 1643   lactated ringers 1,000 mL (01/07/22 1646)     LOS: 0 days    Time spent:    Erick Blinks, MD [facility]   If 7PM-7AM, please contact night-coverage [email or web address]  01/07/2022, 5:42 PM

## 2022-01-07 NOTE — Hospital Course (Signed)
Brandon Jensen is a 41 y.o. male with medical history significant for HLD, anxiety who is admitted with rhabdomyolysis.

## 2022-01-07 NOTE — Progress Notes (Signed)
Transition of Care [facility]) Screening Note  Patient Details  Name: Brandon Jensen Date of Birth: [birth year]  Transition of Care [facility]) CM/SW Contact:    Ewing Schlein, LCSW Phone Number: 01/07/2022, 12:56 PM  Transition of Care Department [facility]) has reviewed patient and no TOC needs have been identified at this time. We will continue to monitor patient advancement through interdisciplinary progression rounds. If new patient transition needs arise, please place a TOC consult.

## 2022-01-08 LAB — CK: Total CK: 41692 U/L — ABNORMAL HIGH (ref 49–397)

## 2022-01-08 LAB — TSH: TSH: 2.683 u[IU]/mL (ref 0.350–4.500)

## 2022-01-08 NOTE — Progress Notes (Signed)
PROGRESS NOTE    Brandon Jensen  [id number]:[id number] DOB: 05-03-1981 DOA: 01/06/2022 PCP: [name], NP    Brief Narrative:  41 year old male, came to the hospital with complaints of muscle pain in his legs, change in color of urine.  Found to have rhabdomyolysis after heavy workout routine.  CK greater than 48,000.  Admitted for IV fluids.   Assessment & Plan:   Principal Problem:   Rhabdomyolysis Active Problems:   Elevated LFTs   Mixed hyperlipidemia   Anxiety   Rhabdomyolysis -Secondary to heavy workout routine -Initial CK 48,945 -Creatinine is currently stable -He is making a fair amount of urine -CK is 41,692 -Recheck labs in a.m. -He is chronically on statin, but has not had any changes in dosage -TSH normal  Elevated liver enzymes -Suspect related to rhabdo -No abdominal symptoms  Anxiety -Continue home dose Paxil and Xanax  Hyperlipidemia -Statin currently on hold   DVT prophylaxis: enoxaparin (LOVENOX) injection 40 mg Start: 01/07/22 1000  Code Status: full code Family Communication: significant other at bedside Disposition Plan: Status is: Inpatient Remains inpatient appropriate because: continued elevation of CK, needing IV fluids     Consultants:    Procedures:    Antimicrobials:      Subjective: He is feeling better.  Reports that pain in legs is nearly gone.  Denies any tightness.  Objective: Vitals:   01/08/22 0151 01/08/22 0527 01/08/22 0830 01/08/22 1410  BP: 117/84 111/81 116/74 119/84  Pulse: 68 71 64 (!) 59  Resp: 18 18 16 18   Temp: 97.8 F (36.6 C) (!) 97.4 F (36.3 C) 98.7 F (37.1 C) 98.1 F (36.7 C)  TempSrc: Oral Oral Oral Oral  SpO2: 100% 100% 100% 100%  Weight:      Height:        Intake/Output Summary (Last 24 hours) at 01/08/2022 1552 Last data filed at 01/08/2022 1010 Gross per 24 hour  Intake 6054.63 ml  Output 6400 ml  Net -345.37 ml   Filed Weights   01/06/22 1855  Weight: 70.3 kg     Examination:  General exam: Appears calm and comfortable  Respiratory system: Clear to auscultation. Respiratory effort normal. Cardiovascular system: S1 & S2 heard, RRR. No JVD, murmurs, rubs, gallops or clicks. No pedal edema. Gastrointestinal system: Abdomen is nondistended, soft and nontender. No organomegaly or masses felt. Normal bowel sounds heard. Central nervous system: Alert and oriented. No focal neurological deficits. Extremities: Symmetric 5 x 5 power.  Bilateral thigh compartments are soft Skin: No rashes, lesions or ulcers Psychiatry: Judgement and insight appear normal. Mood & affect appropriate.     Data Reviewed: I have personally reviewed following labs and imaging studies  CBC: Recent Labs  Lab 01/06/22 1904 01/07/22 0329  WBC 6.5 6.5  NEUTROABS 3.4  --   HGB 15.1 14.2  HCT 41.7 41.0  MCV 88.5 91.1  PLT 203 191   Basic Metabolic Panel: Recent Labs  Lab 01/06/22 1904 01/07/22 0329  NA 139 140  K 4.4 3.9  CL 104 109  CO2 27 27  GLUCOSE 89 105*  BUN 14 13  CREATININE 0.82 0.81  CALCIUM 9.4 8.4*   GFR: Estimated Creatinine Clearance: 119.3 mL/min (by C-G formula based on SCr of 0.81 mg/dL). Liver Function Tests: Recent Labs  Lab 01/06/22 1904 01/07/22 0329  AST 741* 731*  ALT 161* 161*  ALKPHOS 70 62  BILITOT 1.4* 1.0  PROT 7.1 6.3*  ALBUMIN 4.2 3.9   No results for input(s): LIPASE,  AMYLASE in the last 168 hours. No results for input(s): AMMONIA in the last 168 hours. Coagulation Profile: No results for input(s): INR, PROTIME in the last 168 hours. Cardiac Enzymes: Recent Labs  Lab 01/06/22 1904 01/07/22 0329 01/08/22 0341  CKTOTAL 48,945* 99,357* 01,779*   BNP (last 3 results) No results for input(s): PROBNP in the last 8760 hours. HbA1C: No results for input(s): HGBA1C in the last 72 hours. CBG: No results for input(s): GLUCAP in the last 168 hours. Lipid Profile: No results for input(s): CHOL, HDL, LDLCALC, TRIG,  CHOLHDL, LDLDIRECT in the last 72 hours. Thyroid Function Tests: Recent Labs    01/08/22 1431  TSH 2.683   Anemia Panel: No results for input(s): VITAMINB12, FOLATE, FERRITIN, TIBC, IRON, RETICCTPCT in the last 72 hours. Sepsis Labs: No results for input(s): PROCALCITON, LATICACIDVEN in the last 168 hours.  No results found for this or any previous visit (from the past 240 hour(s)).       Radiology Studies: No results found.      Scheduled Meds:  enoxaparin (LOVENOX) injection  40 mg Subcutaneous Q24H   PARoxetine  40 mg Oral QHS   Continuous Infusions:  sodium chloride 150 mL/hr at 01/08/22 1549     LOS: 1 day    Time spent:    Erick Blinks, MD [facility]   If 7PM-7AM, please contact night-coverage [email or web address]  01/08/2022, 3:52 PM

## 2022-01-09 LAB — BASIC METABOLIC PANEL
Anion gap: 4 — ABNORMAL LOW (ref 5–15)
BUN: 11 mg/dL (ref 6–20)
CO2: 28 mmol/L (ref 22–32)
Calcium: 8.7 mg/dL — ABNORMAL LOW (ref 8.9–10.3)
Chloride: 108 mmol/L (ref 98–111)
Creatinine, Ser: 0.9 mg/dL (ref 0.61–1.24)
GFR, Estimated: >60 mL/min (ref >60)
Glucose, Bld: 88 mg/dL (ref 70–99)
Potassium: 4.2 mmol/L (ref 3.5–5.1)
Sodium: 140 mmol/L (ref 135–145)

## 2022-01-09 LAB — CK: Total CK: 25593 U/L — ABNORMAL HIGH (ref 49–397)

## 2022-01-09 NOTE — Plan of Care (Signed)
  Problem: Education: Goal: Knowledge of General Education information will improve Description: Including pain rating scale, medication(s)/side effects and non-pharmacologic comfort measures Outcome: Progressing   Problem: Health Behavior/Discharge Planning: Goal: Ability to manage health-related needs will improve Outcome: Progressing   Problem: Activity: Goal: Risk for activity intolerance will decrease Outcome: Progressing   Problem: Nutrition: Goal: Adequate nutrition will be maintained Outcome: Adequate for Discharge   Problem: Elimination: Goal: Will not experience complications related to urinary retention Outcome: Adequate for Discharge

## 2022-01-09 NOTE — Plan of Care (Signed)
  Problem: Education: Goal: Knowledge of General Education information will improve Description: Including pain rating scale, medication(s)/side effects and non-pharmacologic comfort measures Outcome: Progressing   Problem: Clinical Measurements: Goal: Ability to maintain clinical measurements within normal limits will improve Outcome: Progressing Goal: Diagnostic test results will improve Outcome: Progressing   Problem: Nutrition: Goal: Adequate nutrition will be maintained Outcome: Progressing   Problem: Pain Managment: Goal: General experience of comfort will improve Outcome: Progressing   Problem: Safety: Goal: Ability to remain free from injury will improve Outcome: Progressing

## 2022-01-09 NOTE — Plan of Care (Signed)
  Problem: Education: Goal: Knowledge of General Education information will improve Description: Including pain rating scale, medication(s)/side effects and non-pharmacologic comfort measures Outcome: Adequate for Discharge   Problem: Clinical Measurements: Goal: Diagnostic test results will improve Outcome: Progressing   Problem: Nutrition: Goal: Adequate nutrition will be maintained Outcome: Adequate for Discharge   Problem: Coping: Goal: Level of anxiety will decrease Outcome: Progressing

## 2022-01-09 NOTE — Progress Notes (Signed)
Brandon Jensen  FWY:[id number] DOB: Jul 08, 1981 DOA: 01/06/2022 PCP: [name], NP    Brief Narrative:  41 year old male, came to the hospital with complaints of muscle pain in his legs, change in color of urine.  Found to have rhabdomyolysis after heavy workout routine.  CK greater than 48,000.  Admitted for IV fluids.   Assessment & Plan:   Principal Problem:   Rhabdomyolysis Active Problems:   Elevated LFTs   Mixed hyperlipidemia   Anxiety   Rhabdomyolysis -Secondary to heavy workout routine -Initial CK 48,945 -Creatinine is currently stable -He is making a fair amount of urine -CK is now 25,593 -Recheck labs in a.m. -He is chronically on statin, but has not had any changes in dosage -TSH normal  Elevated liver enzymes -Suspect related to rhabdo -No abdominal symptoms  Anxiety -Continue home dose Paxil and Xanax  Hyperlipidemia -Statin currently on hold   DVT prophylaxis: enoxaparin (LOVENOX) injection 40 mg Start: 01/07/22 1000  Code Status: full code Family Communication: significant other at bedside Disposition Plan: Status is: Inpatient Remains inpatient appropriate because: continued elevation of CK, needing IV fluids     Consultants:    Procedures:    Antimicrobials:      Subjective: He is feeling better.  Denies any significant pain.  No new complaints.  Objective: Vitals:   01/09/22 0617 01/09/22 0938 01/09/22 1350 01/09/22 1352  BP: 120/85 123/83 (!) 138/93 (!) 128/91  Pulse: 67 71 73 66  Resp: 18 17 16    Temp: 97.8 F (36.6 C) 97.8 F (36.6 C) 97.8 F (36.6 C)   TempSrc:  Oral Oral   SpO2: 99% 100% 100%   Weight:      Height:        Intake/Output Summary (Last 24 hours) at 01/09/2022 1451 Last data filed at 01/09/2022 1400 Gross per 24 hour  Intake 4429.23 ml  Output 7450 ml  Net -3020.77 ml   Filed Weights   01/06/22 1855  Weight: 70.3 kg    Examination:  General exam: Appears calm  and comfortable  Respiratory system: Clear to auscultation. Respiratory effort normal. Cardiovascular system: S1 & S2 heard, RRR. No JVD, murmurs, rubs, gallops or clicks. No pedal edema. Gastrointestinal system: Abdomen is nondistended, soft and nontender. No organomegaly or masses felt. Normal bowel sounds heard. Central nervous system: Alert and oriented. No focal neurological deficits. Extremities: Symmetric 5 x 5 power.  Bilateral thigh compartments are soft Skin: No rashes, lesions or ulcers Psychiatry: Judgement and insight appear normal. Mood & affect appropriate.     Data Reviewed: I have personally reviewed following labs and imaging studies  CBC: Recent Labs  Lab 01/06/22 1904 01/07/22 0329  WBC 6.5 6.5  NEUTROABS 3.4  --   HGB 15.1 14.2  HCT 41.7 41.0  MCV 88.5 91.1  PLT 203 191   Basic Metabolic Panel: Recent Labs  Lab 01/06/22 1904 01/07/22 0329 01/09/22 0308  NA 139 140 140  K 4.4 3.9 4.2  CL 104 109 108  CO2 27 27 28   GLUCOSE 89 105* 88  BUN 14 13 11   CREATININE 0.82 0.81 0.90  CALCIUM 9.4 8.4* 8.7*   GFR: Estimated Creatinine Clearance: 107.4 mL/min (by C-G formula based on SCr of 0.9 mg/dL). Liver Function Tests: Recent Labs  Lab 01/06/22 1904 01/07/22 0329  AST 741* 731*  ALT 161* 161*  ALKPHOS 70 62  BILITOT 1.4* 1.0  PROT 7.1 6.3*  ALBUMIN 4.2 3.9   No  results for input(s): LIPASE, AMYLASE in the last 168 hours. No results for input(s): AMMONIA in the last 168 hours. Coagulation Profile: No results for input(s): INR, PROTIME in the last 168 hours. Cardiac Enzymes: Recent Labs  Lab 01/06/22 1904 01/07/22 0329 01/08/22 0341 01/09/22 0308  CKTOTAL 40,981* 19,147* 82,956* 25,593*   BNP (last 3 results) No results for input(s): PROBNP in the last 8760 hours. HbA1C: No results for input(s): HGBA1C in the last 72 hours. CBG: No results for input(s): GLUCAP in the last 168 hours. Lipid Profile: No results for input(s): CHOL, HDL,  LDLCALC, TRIG, CHOLHDL, LDLDIRECT in the last 72 hours. Thyroid Function Tests: Recent Labs    01/08/22 1431  TSH 2.683   Anemia Panel: No results for input(s): VITAMINB12, FOLATE, FERRITIN, TIBC, IRON, RETICCTPCT in the last 72 hours. Sepsis Labs: No results for input(s): PROCALCITON, LATICACIDVEN in the last 168 hours.  No results found for this or any previous visit (from the past 240 hour(s)).       Radiology Studies: No results found.      Scheduled Meds:  enoxaparin (LOVENOX) injection  40 mg Subcutaneous Q24H   PARoxetine  40 mg Oral QHS   Continuous Infusions:  sodium chloride 150 mL/hr at 01/09/22 1034     LOS: 2 days    Time spent:    Erick Blinks, MD [facility]   If 7PM-7AM, please contact night-coverage [email or web address]  01/09/2022, 2:51 PM

## 2022-01-10 LAB — CK
Total CK: 11525 U/L — ABNORMAL HIGH (ref 49–397)
Total CK: 9046 U/L — ABNORMAL HIGH (ref 49–397)

## 2022-01-10 NOTE — Discharge Summary (Signed)
Physician Discharge Summary  Brandon Jensen JSE:[id number] DOB: 07/15/1981 DOA: 01/06/2022  PCP: [name], NP  Admit date: 01/06/2022 Discharge date: 01/10/2022  Admitted From: Home Disposition: Home  Recommendations for Outpatient Follow-up:  Follow-up with PCP in the next 1 week for repeat CK, CMP Consider restarting on atorvastatin once LFTs/CK have normalized   Discharge Condition: Stable CODE STATUS: Full code Diet recommendation: Regular diet  Brief/Interim Summary: 41 year old male, came to the hospital with complaints of muscle pain in his legs, change in color of urine.  Found to have rhabdomyolysis after heavy workout routine.  CK greater than 48,000.  Admitted for IV fluids.  Discharge Diagnoses:  Principal Problem:   Rhabdomyolysis Active Problems:   Elevated LFTs   Mixed hyperlipidemia   Anxiety  Rhabdomyolysis -Secondary to heavy workout routine -Initial CK 48,945 -Creatinine is currently stable -He is making a fair amount of urine -CK is now 9,046 -He is chronically on statin, but has not had any changes in dosage -TSH normal -Patient is very eager to discharge home today.  Denies any pain in his legs.  He is able to ambulate.  He does not have any nausea or vomiting and cannot keep down p.o. fluids.  He has been advised to continue aggressive hydration in order to maintain pale color of his urine. -He should have repeat CK and CMP in the next week to ensure improvement   Elevated liver enzymes -Suspect related to rhabdo -No abdominal symptoms   Anxiety -Continue home dose Paxil and Xanax   Hyperlipidemia -Statin currently on hold -Continue to hold on discharge until labs are repeated with PCP.  Discharge Instructions  Discharge Instructions     Diet - low sodium heart healthy   Complete by: As directed    Increase activity slowly   Complete by: As directed       Allergies as of 01/10/2022       Reactions   Beef-derived Products     Confirmed through allergy testing   Corn-containing Products    Confirmed through allergy testing   Eggs Or Egg-derived Products    Confirmed through allergy testing   Milk-related Compounds    Confirmed through allergy testing   Peanut-containing Drug Products    Confirmed through allergy testing   Red Dye Hives   Patient reported not having a reaction, so not sure   Wheat    Confirmed through allergy testing        Medication List     STOP taking these medications    atorvastatin 40 MG tablet Commonly known as: LIPITOR       TAKE these medications    ALPRAZolam 1 MG tablet Commonly known as: XANAX Take 1 tablet (1 mg total) by mouth daily as needed for anxiety. What changed: how much to take   pantoprazole 40 MG tablet Commonly known as: PROTONIX TAKE 1 TABLET (40 MG TOTAL) BY MOUTH DAILY. What changed:  how much to take when to take this   PARoxetine 40 MG tablet Commonly known as: PAXIL TAKE 1 TABLET (40 MG TOTAL) BY MOUTH EVERY MORNING. What changed:  how much to take how to take this when to take this   tadalafil 20 MG tablet Commonly known as: CIALIS TAKE 1/2-1 TABLET (10-20 MG TOTAL) BY MOUTH DAILY AS NEEDED FOR ERECTILE DYSFUNCTION.        Follow-up Information     [name], NP Follow up.   Specialty: Internal Medicine Why: repeat blood work (CK  levels, complete metabolic panel) in 1 week Contact information: [address] [place] [address] [phone]                Allergies  Allergen Reactions   Beef-Derived Products     Confirmed through allergy testing   Corn-Containing Products     Confirmed through allergy testing   Eggs Or Egg-Derived Products     Confirmed through allergy testing   Milk-Related Compounds     Confirmed through allergy testing   Peanut-Containing Drug Products     Confirmed through allergy testing   Red Dye Hives    Patient reported not having a reaction, so not  sure   Wheat     Confirmed through allergy testing    Consultations:    Procedures/Studies: No results found.    Subjective: He is feeling better.  Denies any pain in his legs.  Able to ambulate without difficulty.  Eager to go home.  Discharge Exam: Vitals:   01/09/22 1700 01/09/22 2155 01/10/22 0602 01/10/22 1249  BP: 126/89 (!) 124/91 122/89 119/80  Pulse: 72 66 71 66  Resp: 18 20 18 17   Temp: 98.1 F (36.7 C) 98.1 F (36.7 C) 98.1 F (36.7 C) 98.6 F (37 C)  TempSrc: Oral Oral Oral Oral  SpO2: 100% 98% 99% 97%  Weight:      Height:        General: Pt is alert, awake, not in acute distress Cardiovascular: RRR, S1/S2 +, no rubs, no gallops Respiratory: CTA bilaterally, no wheezing, no rhonchi Abdominal: Soft, NT, ND, bowel sounds + Extremities: no edema, no cyanosis    The results of significant diagnostics from this hospitalization (including imaging, microbiology, ancillary and laboratory) are listed below for reference.     Microbiology: No results found for this or any previous visit (from the past 240 hour(s)).   Labs: BNP (last 3 results) No results for input(s): BNP in the last 8760 hours. Basic Metabolic Panel: Recent Labs  Lab 01/06/22 1904 01/07/22 0329 01/09/22 0308  NA 139 140 140  K 4.4 3.9 4.2  CL 104 109 108  CO2 27 27 28   GLUCOSE 89 105* 88  BUN 14 13 11   CREATININE 0.82 0.81 0.90  CALCIUM 9.4 8.4* 8.7*   Liver Function Tests: Recent Labs  Lab 01/06/22 1904 01/07/22 0329  AST 741* 731*  ALT 161* 161*  ALKPHOS 70 62  BILITOT 1.4* 1.0  PROT 7.1 6.3*  ALBUMIN 4.2 3.9   No results for input(s): LIPASE, AMYLASE in the last 168 hours. No results for input(s): AMMONIA in the last 168 hours. CBC: Recent Labs  Lab 01/06/22 1904 01/07/22 0329  WBC 6.5 6.5  NEUTROABS 3.4  --   HGB 15.1 14.2  HCT 41.7 41.0  MCV 88.5 91.1  PLT 203 191   Cardiac Enzymes: Recent Labs  Lab 01/07/22 0329 01/08/22 0341 01/09/22 0308  01/10/22 0357 01/10/22 1310  CKTOTAL 48,825* 01/11/22* 25,593* 11,525* 9,046*   BNP: Invalid input(s): POCBNP CBG: No results for input(s): GLUCAP in the last 168 hours. D-Dimer No results for input(s): DDIMER in the last 72 hours. Hgb A1c No results for input(s): HGBA1C in the last 72 hours. Lipid Profile No results for input(s): CHOL, HDL, LDLCALC, TRIG, CHOLHDL, LDLDIRECT in the last 72 hours. Thyroid function studies Recent Labs    01/08/22 1431  TSH 2.683   Anemia work up No results for input(s): VITAMINB12, FOLATE, FERRITIN, TIBC, IRON, RETICCTPCT in  the last 72 hours. Urinalysis    Component Value Date/Time   COLORURINE YELLOW 01/06/2022 1903   APPEARANCEUR CLEAR 01/06/2022 1903   LABSPEC 1.020 01/06/2022 1903   PHURINE 7.0 01/06/2022 1903   GLUCOSEU NEGATIVE 01/06/2022 1903   GLUCOSEU NEGATIVE 06/20/2019 0843   HGBUR LARGE (A) 01/06/2022 1903   BILIRUBINUR NEGATIVE 01/06/2022 1903   KETONESUR NEGATIVE 01/06/2022 1903   PROTEINUR 30 (A) 01/06/2022 1903   UROBILINOGEN 0.2 06/20/2019 0843   NITRITE NEGATIVE 01/06/2022 1903   LEUKOCYTESUR NEGATIVE 01/06/2022 1903   Sepsis Labs Invalid input(s): PROCALCITONIN,  WBC,  LACTICIDVEN Microbiology No results found for this or any previous visit (from the past 240 hour(s)).   Time coordinating discharge: 35mins  SIGNED:   Erick BlinksJehanzeb Myisha Pickerel, MD  [facility] 01/10/2022, 3:42 PM   If 7PM-7AM, please contact night-coverage [email or web address]

## 2022-01-10 NOTE — Progress Notes (Signed)
Pt stable at time of d/c instructions and education. Pt denies pain and no signs of distress at time of d/c.

## 2022-01-10 NOTE — Plan of Care (Signed)
Pt stable at this time. No needs at time of rounding with off going RN. Pt to d/c home with family.

## 2022-01-12 ENCOUNTER — Telehealth: Payer: Self-pay

## 2022-01-12 NOTE — Telephone Encounter (Signed)
Transition Care Management Unsuccessful Follow-up Telephone Call  Date of discharge and from where:  TCM DC [facility] 01-10-22 Dx: rhabdomyolysis  Attempts:  1st Attempt  Reason for unsuccessful TCM follow-up call:  Left voice message  Transition Care Management Follow-up Telephone Call Date of discharge and from where: TCM DC [facility] 01-10-22 Dx: rhabdomyolysis How have you been since you were released from the hospital? Doing ok  Any questions or concerns? No  Items Reviewed: Did the pt receive and understand the discharge instructions provided? Yes  Medications obtained and verified? Yes  Other? No  Any new allergies since your discharge? No  Dietary orders reviewed? Yes Do you have support at home? Yes   Home Care and Equipment/Supplies: Were home health services ordered? not applicable If so, what is the name of the agency? na  Has the agency set up a time to come to the patient's home? not applicable Were any new equipment or medical supplies ordered?  No What is the name of the medical supply agency? Na  Were you able to get the supplies/equipment? not applicable Do you have any questions related to the use of the equipment or supplies? No  Functional Questionnaire: (I = Independent and D = Dependent) ADLs: I  Bathing/Dressing- I  Meal Prep- I  Eating- I  Maintaining continence- I  Transferring/Ambulation- I  Managing Meds- I  Follow up appointments reviewed:  PCP Hospital f/u appt confirmed? Yes  [name]? No . Are transportation arrangements needed? No  If their condition worsens, is the pt aware to call PCP or go to the Emergency Dept.? Yes Was the patient provided with contact information for the PCP's office or ED? Yes Was to pt encouraged to call back with questions or concerns? Yes

## 2022-01-17 ENCOUNTER — Encounter (HOSPITAL_COMMUNITY): Payer: Self-pay

## 2022-01-17 ENCOUNTER — Other Ambulatory Visit: Payer: Self-pay

## 2022-01-17 ENCOUNTER — Observation Stay (HOSPITAL_COMMUNITY)
Admission: EM | Admit: 2022-01-17 | Discharge: 2022-01-19 | Disposition: A | Discharge status: Home / Self Care | Payer: 59 | Attending: Surgery | Admitting: Surgery

## 2022-01-17 DIAGNOSIS — R739 Hyperglycemia, unspecified: Secondary | ICD-10-CM

## 2022-01-17 DIAGNOSIS — R7309 Other abnormal glucose: Secondary | ICD-10-CM | POA: Diagnosis not present

## 2022-01-17 DIAGNOSIS — Z91010 Allergy to peanuts: Secondary | ICD-10-CM | POA: Insufficient documentation

## 2022-01-17 DIAGNOSIS — R7401 Elevation of levels of liver transaminase levels: Secondary | ICD-10-CM

## 2022-01-17 DIAGNOSIS — K353 Acute appendicitis with localized peritonitis, without perforation or gangrene: Secondary | ICD-10-CM | POA: Diagnosis not present

## 2022-01-17 DIAGNOSIS — Z79899 Other long term (current) drug therapy: Secondary | ICD-10-CM | POA: Diagnosis not present

## 2022-01-17 DIAGNOSIS — K219 Gastro-esophageal reflux disease without esophagitis: Secondary | ICD-10-CM | POA: Diagnosis not present

## 2022-01-17 DIAGNOSIS — K3580 Unspecified acute appendicitis: Secondary | ICD-10-CM

## 2022-01-17 DIAGNOSIS — R17 Unspecified jaundice: Secondary | ICD-10-CM

## 2022-01-17 DIAGNOSIS — Z87891 Personal history of nicotine dependence: Secondary | ICD-10-CM | POA: Diagnosis not present

## 2022-01-17 DIAGNOSIS — R1031 Right lower quadrant pain: Secondary | ICD-10-CM | POA: Diagnosis present

## 2022-01-17 LAB — CBC WITH DIFFERENTIAL/PLATELET
Abs Immature Granulocytes: 0.06 10*3/uL (ref 0.00–0.07)
Basophils Absolute: 0.1 10*3/uL (ref 0.0–0.1)
Basophils Relative: 1 %
Eosinophils Absolute: 0 10*3/uL (ref 0.0–0.5)
Eosinophils Relative: 0 %
HCT: 43.8 % (ref 39.0–52.0)
Hemoglobin: 15.8 g/dL (ref 13.0–17.0)
Immature Granulocytes: 0 %
Lymphocytes Relative: 9 %
Lymphs Abs: 1.7 10*3/uL (ref 0.7–4.0)
MCH: 32 pg (ref 26.0–34.0)
MCHC: 36.1 g/dL — ABNORMAL HIGH (ref 30.0–36.0)
MCV: 88.8 fL (ref 80.0–100.0)
Monocytes Absolute: 1.4 10*3/uL — ABNORMAL HIGH (ref 0.1–1.0)
Monocytes Relative: 8 %
Neutro Abs: 14.5 10*3/uL — ABNORMAL HIGH (ref 1.7–7.7)
Neutrophils Relative %: 82 %
Platelets: 262 10*3/uL (ref 150–400)
RBC: 4.93 MIL/uL (ref 4.22–5.81)
RDW: 11.9 % (ref 11.5–15.5)
WBC: 17.7 10*3/uL — ABNORMAL HIGH (ref 4.0–10.5)
nRBC: 0 % (ref 0.0–0.2)

## 2022-01-17 LAB — COMPREHENSIVE METABOLIC PANEL
ALT: 78 U/L — ABNORMAL HIGH (ref 0–44)
AST: 32 U/L (ref 15–41)
Albumin: 4.4 g/dL (ref 3.5–5.0)
Alkaline Phosphatase: 63 U/L (ref 38–126)
Anion gap: 9 (ref 5–15)
BUN: 12 mg/dL (ref 6–20)
CO2: 24 mmol/L (ref 22–32)
Calcium: 9.8 mg/dL (ref 8.9–10.3)
Chloride: 105 mmol/L (ref 98–111)
Creatinine, Ser: 0.87 mg/dL (ref 0.61–1.24)
GFR, Estimated: >60 mL/min (ref >60)
Glucose, Bld: 131 mg/dL — ABNORMAL HIGH (ref 70–99)
Potassium: 4.3 mmol/L (ref 3.5–5.1)
Sodium: 138 mmol/L (ref 135–145)
Total Bilirubin: 1.5 mg/dL — ABNORMAL HIGH (ref 0.3–1.2)
Total Protein: 7.3 g/dL (ref 6.5–8.1)

## 2022-01-17 LAB — LIPASE, BLOOD: Lipase: 30 U/L (ref 11–51)

## 2022-01-17 MED ORDER — ONDANSETRON 4 MG PO TBDP
4.0000 mg | ORAL_TABLET | Freq: Once | ORAL | Status: AC
  Administered 2022-01-17: 4 mg via ORAL
  Filled 2022-01-17: qty 1

## 2022-01-17 NOTE — ED Triage Notes (Signed)
Pt presents to ED with c/o LLQ abdominal pain that began this morning. Pt states pain has become severe tonight and states it hurts worse when he coughs. Pt denies emesis, but endorses nausea.

## 2022-01-18 ENCOUNTER — Emergency Department (HOSPITAL_COMMUNITY): Payer: 59

## 2022-01-18 ENCOUNTER — Encounter (HOSPITAL_COMMUNITY): Payer: Self-pay

## 2022-01-18 ENCOUNTER — Observation Stay (HOSPITAL_BASED_OUTPATIENT_CLINIC_OR_DEPARTMENT_OTHER): Payer: 59 | Admitting: Anesthesiology

## 2022-01-18 ENCOUNTER — Encounter (HOSPITAL_COMMUNITY)
Admission: EM | Disposition: A | Discharge status: Home / Self Care | Payer: Self-pay | Source: Home / Self Care | Attending: Emergency Medicine

## 2022-01-18 ENCOUNTER — Observation Stay (HOSPITAL_COMMUNITY): Payer: 59 | Admitting: Anesthesiology

## 2022-01-18 ENCOUNTER — Other Ambulatory Visit: Payer: Self-pay

## 2022-01-18 DIAGNOSIS — K3580 Unspecified acute appendicitis: Secondary | ICD-10-CM | POA: Diagnosis present

## 2022-01-18 HISTORY — DX: Unspecified acute appendicitis: K35.80

## 2022-01-18 HISTORY — PX: LAPAROSCOPIC APPENDECTOMY: SHX408

## 2022-01-18 LAB — URINALYSIS, ROUTINE W REFLEX MICROSCOPIC
Bacteria, UA: NONE SEEN
Bilirubin Urine: NEGATIVE
Glucose, UA: NEGATIVE mg/dL
Hgb urine dipstick: NEGATIVE
Ketones, ur: 5 mg/dL — AB
Leukocytes,Ua: NEGATIVE
Nitrite: NEGATIVE
Protein, ur: NEGATIVE mg/dL
Specific Gravity, Urine: 1.009 (ref 1.005–1.030)
pH: 8 (ref 5.0–8.0)

## 2022-01-18 LAB — CK: Total CK: 239 U/L (ref 49–397)

## 2022-01-18 SURGERY — APPENDECTOMY, LAPAROSCOPIC
Anesthesia: General

## 2022-01-18 MED ORDER — METRONIDAZOLE 500 MG/100ML IV SOLN
500.0000 mg | Freq: Once | INTRAVENOUS | Status: AC
  Administered 2022-01-18: 500 mg via INTRAVENOUS
  Filled 2022-01-18: qty 100

## 2022-01-18 MED ORDER — SODIUM CHLORIDE 0.9 % IV BOLUS
1000.0000 mL | Freq: Once | INTRAVENOUS | Status: AC
  Administered 2022-01-18: 1000 mL via INTRAVENOUS

## 2022-01-18 MED ORDER — OXYCODONE HCL 5 MG/5ML PO SOLN: 5.0000 mg | Freq: Once | ORAL | Status: DC | PRN

## 2022-01-18 MED ORDER — FENTANYL CITRATE (PF) 250 MCG/5ML IJ SOLN
INTRAMUSCULAR | Status: DC | PRN
  Administered 2022-01-18: 100 ug via INTRAVENOUS

## 2022-01-18 MED ORDER — ONDANSETRON 4 MG PO TBDP: 4.0000 mg | ORAL_TABLET | Freq: Four times a day (QID) | ORAL | Status: DC | PRN

## 2022-01-18 MED ORDER — METRONIDAZOLE 500 MG/100ML IV SOLN
500.0000 mg | Freq: Two times a day (BID) | INTRAVENOUS | Status: DC
  Administered 2022-01-18 – 2022-01-19 (×2): 500 mg via INTRAVENOUS
  Filled 2022-01-18 (×2): qty 100

## 2022-01-18 MED ORDER — ACETAMINOPHEN 650 MG RE SUPP: 650.0000 mg | Freq: Four times a day (QID) | RECTAL | Status: DC | PRN

## 2022-01-18 MED ORDER — HYDROMORPHONE HCL 1 MG/ML IJ SOLN: 0.5000 mg | INTRAMUSCULAR | Status: DC | PRN

## 2022-01-18 MED ORDER — HYDROMORPHONE HCL 1 MG/ML IJ SOLN
1.0000 mg | Freq: Once | INTRAMUSCULAR | Status: AC
  Administered 2022-01-18: 1 mg via INTRAVENOUS
  Filled 2022-01-18: qty 1

## 2022-01-18 MED ORDER — BUPIVACAINE-EPINEPHRINE (PF) 0.25% -1:200000 IJ SOLN
INTRAMUSCULAR | Status: AC
  Filled 2022-01-18: qty 30

## 2022-01-18 MED ORDER — ONDANSETRON HCL 4 MG/2ML IJ SOLN
4.0000 mg | Freq: Once | INTRAMUSCULAR | Status: AC
  Administered 2022-01-18: 4 mg via INTRAVENOUS
  Filled 2022-01-18: qty 2

## 2022-01-18 MED ORDER — 0.9 % SODIUM CHLORIDE (POUR BTL) OPTIME
TOPICAL | Status: DC | PRN
  Administered 2022-01-18: 1000 mL

## 2022-01-18 MED ORDER — ACETAMINOPHEN 500 MG PO TABS
1000.0000 mg | ORAL_TABLET | Freq: Once | ORAL | Status: AC
Start: 2022-01-18 — End: 2022-01-18
  Administered 2022-01-18: 1000 mg via ORAL

## 2022-01-18 MED ORDER — ACETAMINOPHEN 500 MG PO TABS
1000.0000 mg | ORAL_TABLET | Freq: Four times a day (QID) | ORAL | Status: DC
  Filled 2022-01-18: qty 2

## 2022-01-18 MED ORDER — TRAMADOL HCL 50 MG PO TABS: 50.0000 mg | ORAL_TABLET | Freq: Four times a day (QID) | ORAL | Status: DC | PRN

## 2022-01-18 MED ORDER — PHENYLEPHRINE HCL (PRESSORS) 10 MG/ML IV SOLN
INTRAVENOUS | Status: DC | PRN
  Administered 2022-01-18: 80 ug via INTRAVENOUS
  Administered 2022-01-18: 160 ug via INTRAVENOUS

## 2022-01-18 MED ORDER — MIDAZOLAM HCL 2 MG/2ML IJ SOLN
INTRAMUSCULAR | Status: AC
Start: 2022-01-18 — End: ?
  Filled 2022-01-18: qty 2

## 2022-01-18 MED ORDER — ALBUTEROL SULFATE HFA 108 (90 BASE) MCG/ACT IN AERS
INHALATION_SPRAY | RESPIRATORY_TRACT | Status: AC
Start: 2022-01-18 — End: ?
  Filled 2022-01-18: qty 6.7

## 2022-01-18 MED ORDER — PAROXETINE HCL 10 MG PO TABS
40.0000 mg | ORAL_TABLET | Freq: Every evening | ORAL | Status: DC
  Administered 2022-01-18: 40 mg via ORAL
  Filled 2022-01-18: qty 4

## 2022-01-18 MED ORDER — DEXAMETHASONE SODIUM PHOSPHATE 10 MG/ML IJ SOLN
INTRAMUSCULAR | Status: DC | PRN
  Administered 2022-01-18: 10 mg via INTRAVENOUS

## 2022-01-18 MED ORDER — ACETAMINOPHEN 325 MG PO TABS: 650.0000 mg | ORAL_TABLET | Freq: Four times a day (QID) | ORAL | Status: DC | PRN

## 2022-01-18 MED ORDER — FENTANYL CITRATE PF 50 MCG/ML IJ SOSY
PREFILLED_SYRINGE | INTRAMUSCULAR | Status: AC
  Administered 2022-01-18: 50 ug via INTRAVENOUS
  Filled 2022-01-18: qty 3

## 2022-01-18 MED ORDER — LACTATED RINGERS IR SOLN
Status: DC | PRN
  Administered 2022-01-18: 1000 mL

## 2022-01-18 MED ORDER — SUCCINYLCHOLINE CHLORIDE 200 MG/10ML IV SOSY
PREFILLED_SYRINGE | INTRAVENOUS | Status: DC | PRN
  Administered 2022-01-18: 140 mg via INTRAVENOUS

## 2022-01-18 MED ORDER — DEXAMETHASONE SODIUM PHOSPHATE 10 MG/ML IJ SOLN
INTRAMUSCULAR | Status: AC
Start: 2022-01-18 — End: ?
  Filled 2022-01-18: qty 1

## 2022-01-18 MED ORDER — ALPRAZOLAM 0.5 MG PO TABS
0.5000 mg | ORAL_TABLET | Freq: Every day | ORAL | Status: DC | PRN
  Administered 2022-01-18: 1 mg via ORAL
  Filled 2022-01-18: qty 2

## 2022-01-18 MED ORDER — SUGAMMADEX SODIUM 200 MG/2ML IV SOLN
INTRAVENOUS | Status: DC | PRN
  Administered 2022-01-18: 150 mg via INTRAVENOUS

## 2022-01-18 MED ORDER — SODIUM CHLORIDE 0.45 % IV SOLN: INTRAVENOUS | Status: DC

## 2022-01-18 MED ORDER — SODIUM CHLORIDE 0.9 % IV SOLN
2.0000 g | INTRAVENOUS | Status: DC
  Administered 2022-01-19: 2 g via INTRAVENOUS
  Filled 2022-01-18: qty 20

## 2022-01-18 MED ORDER — OXYCODONE HCL 5 MG PO TABS
5.0000 mg | ORAL_TABLET | ORAL | Status: DC | PRN
  Administered 2022-01-18 – 2022-01-19 (×2): 10 mg via ORAL
  Filled 2022-01-18 (×2): qty 2

## 2022-01-18 MED ORDER — SUCCINYLCHOLINE CHLORIDE 200 MG/10ML IV SOSY
PREFILLED_SYRINGE | INTRAVENOUS | Status: AC
  Filled 2022-01-18: qty 10

## 2022-01-18 MED ORDER — LACTATED RINGERS IV SOLN: INTRAVENOUS | Status: DC

## 2022-01-18 MED ORDER — BUPIVACAINE-EPINEPHRINE 0.25% -1:200000 IJ SOLN
INTRAMUSCULAR | Status: DC | PRN
  Administered 2022-01-18: 20 mL

## 2022-01-18 MED ORDER — ROCURONIUM BROMIDE 10 MG/ML (PF) SYRINGE
PREFILLED_SYRINGE | INTRAVENOUS | Status: AC
Start: 2022-01-18 — End: ?
  Filled 2022-01-18: qty 10

## 2022-01-18 MED ORDER — FENTANYL CITRATE (PF) 100 MCG/2ML IJ SOLN
INTRAMUSCULAR | Status: AC
  Filled 2022-01-18: qty 2

## 2022-01-18 MED ORDER — MIDAZOLAM HCL 5 MG/5ML IJ SOLN
INTRAMUSCULAR | Status: DC | PRN
  Administered 2022-01-18: 2 mg via INTRAVENOUS

## 2022-01-18 MED ORDER — MORPHINE SULFATE (PF) 4 MG/ML IV SOLN
4.0000 mg | Freq: Once | INTRAVENOUS | Status: AC
  Administered 2022-01-18: 4 mg via INTRAVENOUS
  Filled 2022-01-18: qty 1

## 2022-01-18 MED ORDER — HYDROMORPHONE HCL 1 MG/ML IJ SOLN
INTRAMUSCULAR | Status: AC
  Administered 2022-01-18: 0.5 mg via INTRAVENOUS
  Filled 2022-01-18: qty 1

## 2022-01-18 MED ORDER — ONDANSETRON HCL 4 MG/2ML IJ SOLN
INTRAMUSCULAR | Status: DC | PRN
  Administered 2022-01-18: 4 mg via INTRAVENOUS

## 2022-01-18 MED ORDER — PROPOFOL 10 MG/ML IV BOLUS
INTRAVENOUS | Status: DC | PRN
  Administered 2022-01-18: 180 mg via INTRAVENOUS

## 2022-01-18 MED ORDER — HYDROMORPHONE HCL 1 MG/ML IJ SOLN
0.5000 mg | INTRAMUSCULAR | Status: DC | PRN
  Administered 2022-01-18 (×2): 0.5 mg via INTRAVENOUS

## 2022-01-18 MED ORDER — LIDOCAINE HCL (PF) 2 % IJ SOLN
INTRAMUSCULAR | Status: AC
  Filled 2022-01-18: qty 5

## 2022-01-18 MED ORDER — PROPOFOL 10 MG/ML IV BOLUS
INTRAVENOUS | Status: AC
  Filled 2022-01-18: qty 20

## 2022-01-18 MED ORDER — LIDOCAINE 2% (20 MG/ML) 5 ML SYRINGE
INTRAMUSCULAR | Status: DC | PRN
  Administered 2022-01-18: 80 mg via INTRAVENOUS

## 2022-01-18 MED ORDER — ONDANSETRON HCL 4 MG/2ML IJ SOLN: 4.0000 mg | Freq: Once | INTRAMUSCULAR | Status: DC | PRN

## 2022-01-18 MED ORDER — ROCURONIUM BROMIDE 10 MG/ML (PF) SYRINGE
PREFILLED_SYRINGE | INTRAVENOUS | Status: DC | PRN
  Administered 2022-01-18: 35 mg via INTRAVENOUS
  Administered 2022-01-18: 5 mg via INTRAVENOUS

## 2022-01-18 MED ORDER — LACTATED RINGERS IV BOLUS
1000.0000 mL | Freq: Once | INTRAVENOUS | Status: AC
  Administered 2022-01-18: 1000 mL via INTRAVENOUS

## 2022-01-18 MED ORDER — OXYCODONE HCL 5 MG PO TABS: 5.0000 mg | ORAL_TABLET | Freq: Once | ORAL | Status: DC | PRN

## 2022-01-18 MED ORDER — DIPHENHYDRAMINE HCL 25 MG PO CAPS: 25.0000 mg | ORAL_CAPSULE | Freq: Four times a day (QID) | ORAL | Status: DC | PRN

## 2022-01-18 MED ORDER — HYDROMORPHONE HCL 1 MG/ML IJ SOLN: 1.0000 mg | INTRAMUSCULAR | Status: DC | PRN

## 2022-01-18 MED ORDER — PANTOPRAZOLE SODIUM 40 MG PO TBEC
40.0000 mg | DELAYED_RELEASE_TABLET | Freq: Every day | ORAL | Status: DC
  Administered 2022-01-18 – 2022-01-19 (×2): 40 mg via ORAL
  Filled 2022-01-18 (×2): qty 1

## 2022-01-18 MED ORDER — AMISULPRIDE (ANTIEMETIC) 5 MG/2ML IV SOLN: 10.0000 mg | Freq: Once | INTRAVENOUS | Status: DC | PRN

## 2022-01-18 MED ORDER — FENTANYL CITRATE PF 50 MCG/ML IJ SOSY
25.0000 ug | PREFILLED_SYRINGE | INTRAMUSCULAR | Status: DC | PRN
  Administered 2022-01-18 (×2): 50 ug via INTRAVENOUS

## 2022-01-18 MED ORDER — HYDROMORPHONE HCL 1 MG/ML IJ SOLN
INTRAMUSCULAR | Status: AC
  Filled 2022-01-18: qty 1

## 2022-01-18 MED ORDER — CHLORHEXIDINE GLUCONATE CLOTH 2 % EX PADS: 6.0000 | MEDICATED_PAD | Freq: Once | CUTANEOUS | Status: DC

## 2022-01-18 MED ORDER — LACTATED RINGERS IV BOLUS: 1000.0000 mL | Freq: Once | INTRAVENOUS | Status: DC

## 2022-01-18 MED ORDER — DIPHENHYDRAMINE HCL 50 MG/ML IJ SOLN: 25.0000 mg | Freq: Four times a day (QID) | INTRAMUSCULAR | Status: DC | PRN

## 2022-01-18 MED ORDER — IOHEXOL 300 MG/ML  SOLN
100.0000 mL | Freq: Once | INTRAMUSCULAR | Status: AC | PRN
  Administered 2022-01-18: 100 mL via INTRAVENOUS

## 2022-01-18 MED ORDER — DOCUSATE SODIUM 100 MG PO CAPS: 100.0000 mg | ORAL_CAPSULE | Freq: Two times a day (BID) | ORAL | Status: DC

## 2022-01-18 MED ORDER — ONDANSETRON HCL 4 MG/2ML IJ SOLN: 4.0000 mg | Freq: Four times a day (QID) | INTRAMUSCULAR | Status: DC | PRN

## 2022-01-18 MED ORDER — ENOXAPARIN SODIUM 40 MG/0.4ML IJ SOSY: 40.0000 mg | PREFILLED_SYRINGE | INTRAMUSCULAR | Status: DC

## 2022-01-18 MED ORDER — ONDANSETRON HCL 4 MG/2ML IJ SOLN
INTRAMUSCULAR | Status: AC
  Filled 2022-01-18: qty 2

## 2022-01-18 MED ORDER — SODIUM CHLORIDE 0.9 % IV SOLN
2.0000 g | Freq: Once | INTRAVENOUS | Status: AC
  Administered 2022-01-18: 2 g via INTRAVENOUS
  Filled 2022-01-18: qty 20

## 2022-01-18 SURGICAL SUPPLY — 47 items
ADH SKN CLS APL DERMABOND .7 (GAUZE/BANDAGES/DRESSINGS) ×1
APL PRP STRL LF DISP 70% ISPRP (MISCELLANEOUS) ×1
APPLIER CLIP ROT 10 11.4 M/L (STAPLE)
APR CLP MED LRG 11.4X10 (STAPLE)
BAG COUNTER SPONGE SURGICOUNT (BAG) IMPLANT
BAG RETRIEVAL 10 (BASKET) ×1
BAG SPNG CNTER NS LX DISP (BAG)
CHLORAPREP W/TINT 26 (MISCELLANEOUS) ×2 IMPLANT
CLIP APPLIE ROT 10 11.4 M/L (STAPLE) IMPLANT
COVER SURGICAL LIGHT HANDLE (MISCELLANEOUS) ×2 IMPLANT
CUTTER FLEX LINEAR 45M (STAPLE) ×1 IMPLANT
DERMABOND ADVANCED (GAUZE/BANDAGES/DRESSINGS) ×1
DERMABOND ADVANCED .7 DNX12 (GAUZE/BANDAGES/DRESSINGS) ×1 IMPLANT
DRAPE LAPAROSCOPIC ABDOMINAL (DRAPES) ×2 IMPLANT
ELECT PENCIL ROCKER SW 15FT (MISCELLANEOUS) ×1 IMPLANT
ELECT REM PT RETURN 15FT ADLT (MISCELLANEOUS) ×2 IMPLANT
ENDOLOOP SUT PDS II  0 18 (SUTURE)
ENDOLOOP SUT PDS II 0 18 (SUTURE) IMPLANT
GLOVE SURG ORTHO 8.0 STRL STRW (GLOVE) ×2 IMPLANT
GLOVE SURG SYN 7.5  E (GLOVE) ×2
GLOVE SURG SYN 7.5 E (GLOVE) ×1 IMPLANT
GLOVE SURG SYN 7.5 PF PI (GLOVE) ×1 IMPLANT
GOWN STRL REUS W/ TWL XL LVL3 (GOWN DISPOSABLE) ×2 IMPLANT
GOWN STRL REUS W/TWL XL LVL3 (GOWN DISPOSABLE) ×4
IRRIG SUCT STRYKERFLOW 2 WTIP (MISCELLANEOUS) ×2
IRRIGATION SUCT STRKRFLW 2 WTP (MISCELLANEOUS) ×1 IMPLANT
KIT BASIN OR (CUSTOM PROCEDURE TRAY) ×2 IMPLANT
KIT TURNOVER KIT A (KITS) IMPLANT
RELOAD 45 VASCULAR/THIN (ENDOMECHANICALS) IMPLANT
RELOAD STAPLE 45 2.5 WHT GRN (ENDOMECHANICALS) IMPLANT
RELOAD STAPLE 45 3.5 BLU ETS (ENDOMECHANICALS) IMPLANT
RELOAD STAPLE TA45 3.5 REG BLU (ENDOMECHANICALS) ×2 IMPLANT
SET TUBE SMOKE EVAC HIGH FLOW (TUBING) ×2 IMPLANT
SHEARS HARMONIC ACE PLUS 36CM (ENDOMECHANICALS) ×2 IMPLANT
SPIKE FLUID TRANSFER (MISCELLANEOUS) ×2 IMPLANT
STRIP CLOSURE SKIN 1/2X4 (GAUZE/BANDAGES/DRESSINGS) ×2 IMPLANT
SUT MNCRL AB 4-0 PS2 18 (SUTURE) ×2 IMPLANT
SYS BAG RETRIEVAL 10MM (BASKET) ×1
SYSTEM BAG RETRIEVAL 10MM (BASKET) ×1 IMPLANT
TOWEL OR 17X26 10 PK STRL BLUE (TOWEL DISPOSABLE) ×2 IMPLANT
TOWEL OR NON WOVEN STRL DISP B (DISPOSABLE) ×2 IMPLANT
TRAY FOLEY MTR SLVR 14FR STAT (SET/KITS/TRAYS/PACK) IMPLANT
TRAY FOLEY MTR SLVR 16FR STAT (SET/KITS/TRAYS/PACK) IMPLANT
TRAY LAPAROSCOPIC (CUSTOM PROCEDURE TRAY) ×2 IMPLANT
TROCAR BALLN 12MMX100 BLUNT (TROCAR) ×2 IMPLANT
TROCAR XCEL NON-BLD 11X100MML (ENDOMECHANICALS) ×2 IMPLANT
TROCAR Z-THREAD OPTICAL 5X100M (TROCAR) ×2 IMPLANT

## 2022-01-18 NOTE — Anesthesia Procedure Notes (Signed)
Procedure Name: Intubation Date/Time: 01/18/2022 12:47 PM Performed by: Vaishnav Demartin, Clinical cytogeneticist D, CRNA Pre-anesthesia Checklist: Patient identified, Emergency Drugs available, Suction available and Patient being monitored Patient Re-evaluated:Patient Re-evaluated prior to induction Oxygen Delivery Method: Circle system utilized Preoxygenation: Pre-oxygenation with 100% oxygen Induction Type: IV induction and Rapid sequence Laryngoscope Size: Mac and 4 Grade View: Grade I Tube type: Oral Number of attempts: 1 Airway Equipment and Method: Stylet Placement Confirmation: ETT inserted through vocal cords under direct vision, positive ETCO2 and breath sounds checked- equal and bilateral Secured at: 22 cm Tube secured with: Tape Dental Injury: Teeth and Oropharynx as per pre-operative assessment

## 2022-01-18 NOTE — ED Notes (Signed)
Girlfriend at bedside.

## 2022-01-18 NOTE — H&P (Signed)
ZIYAN SCHOON March 30, 1981  [id number].    Requesting MD: Dr. Preston Fleeting Chief Complaint/Reason for Consult: appendicitis  HPI:  Mr. Wildes is a 41 yo male who presented to the ED with abdominal pain. About 24 hours ago he started having pain the RLQ, which has persisted and gotten worse. He endorses fevers but no chills. Labs in the ED show a leukocytosis with a WBC of 17. CT scan was consistent with acute appendicitis without perforation or abscess.  The patient previously had a laparoscopic hernia repair with mesh, he is not sure if it was inguinal. He was recently admitted with rhabdomyolysis after starting an intense workout routine and was discharged on 5/28. He denies any leg pain today and his renal function and LFTs are normal. CK is pending.  ROS: Review of Systems  Constitutional:  Positive for chills. Negative for fever.  Respiratory:  Negative for shortness of breath and wheezing.   Cardiovascular:  Negative for chest pain.  Gastrointestinal:  Positive for abdominal pain and nausea.  Musculoskeletal:  Negative for myalgias.  Neurological:  Negative for seizures and weakness.   Family History  Problem Relation Age of Onset   CAD Father 25       s/p cabg x 3   Anxiety disorder Sister    Kidney disease Maternal Grandmother    Colon cancer Maternal Grandfather 89   Bladder Cancer Maternal Grandfather    Skin cancer Maternal Grandfather    Stroke Maternal Grandfather    Hypertension Paternal Grandfather    Stroke Paternal Grandfather    Prostate cancer Paternal Grandfather    Heart disease Paternal Grandfather    Stomach cancer Neg Hx     Past Medical History:  Diagnosis Date   Allergy    Anxiety    GERD (gastroesophageal reflux disease)    Hyperlipemia    Obstructive sleep apnea 01/18/2011    Past Surgical History:  Procedure Laterality Date   INGUINAL HERNIA REPAIR      Social History:  reports that he quit smoking about 8 years ago. His smoking use included  cigarettes. His smokeless tobacco use includes snuff. He reports that he does not drink alcohol and does not use drugs.  Allergies:  Allergies  Allergen Reactions   Beef-Derived Products     Confirmed through allergy testing   Corn-Containing Products     Confirmed through allergy testing   Eggs Or Egg-Derived Products     Confirmed through allergy testing   Milk-Related Compounds     Confirmed through allergy testing   Peanut-Containing Drug Products     Confirmed through allergy testing   Red Dye Hives    Patient reported not having a reaction, so not sure   Wheat     Confirmed through allergy testing    (Not in a hospital admission)    Physical Exam: Blood pressure 122/73, pulse (!) 104, temperature 98.5 F (36.9 C), temperature source Oral, resp. rate 18, height 5\' 10"  (1.778 m), weight 70.3 kg, SpO2 96 %. General: resting comfortably, appears stated age, no apparent distress Neurological: alert and oriented, no focal deficits, cranial nerves grossly in tact HEENT: normocephalic, atraumatic,no scleral icterus CV: regular rate and rhythm, extremities warm and well-perfused Respiratory: normal work of breathing on room air, symmetric chest wall expansion Abdomen: soft, nondistended, focally tender to palpation in the RLQ. No masses or organomegaly. Extremities: warm and well-perfused, no deformities, moving all extremities spontaneously Psychiatric: normal mood and affect Skin: warm and dry, no jaundice,  no rashes or lesions   Results for orders placed or performed during the hospital encounter of 01/17/22 (from the past 48 hour(s))  Comprehensive metabolic panel     Status: Abnormal   Collection Time: 01/17/22  9:20 PM  Result Value Ref Range   Sodium 138 135 - 145 mmol/L   Potassium 4.3 3.5 - 5.1 mmol/L   Chloride 105 98 - 111 mmol/L   CO2 24 22 - 32 mmol/L   Glucose, Bld 131 (H) 70 - 99 mg/dL    Comment: Glucose reference range applies only to samples taken after  fasting for at least 8 hours.   BUN 12 6 - 20 mg/dL   Creatinine, Ser 6.22 0.61 - 1.24 mg/dL   Calcium 9.8 8.9 - 63.3 mg/dL   Total Protein 7.3 6.5 - 8.1 g/dL   Albumin 4.4 3.5 - 5.0 g/dL   AST 32 15 - 41 U/L   ALT 78 (H) 0 - 44 U/L   Alkaline Phosphatase 63 38 - 126 U/L   Total Bilirubin 1.5 (H) 0.3 - 1.2 mg/dL   GFR, Estimated >35 >45 mL/min    Comment: (NOTE) Calculated using the CKD-EPI Creatinine Equation (2021)    Anion gap 9 5 - 15    Comment: Performed at [facility], [address] [place], [place] [address]  Lipase, blood     Status: None   Collection Time: 01/17/22  9:20 PM  Result Value Ref Range   Lipase 30 11 - 51 U/L    Comment: Performed at [facility], [address], [place], [place] [address]  CBC with Diff     Status: Abnormal   Collection Time: 01/17/22  9:20 PM  Result Value Ref Range   WBC 17.7 (H) 4.0 - 10.5 K/uL   RBC 4.93 4.22 - 5.81 MIL/uL   Hemoglobin 15.8 13.0 - 17.0 g/dL   HCT 42.8 76.8 - 11.5 %   MCV 88.8 80.0 - 100.0 fL   MCH 32.0 26.0 - 34.0 pg   MCHC 36.1 (H) 30.0 - 36.0 g/dL   RDW 72.6 20.3 - 55.9 %   Platelets 262 150 - 400 K/uL   nRBC 0.0 0.0 - 0.2 %   Neutrophils Relative % 82 %   Neutro Abs 14.5 (H) 1.7 - 7.7 K/uL   Lymphocytes Relative 9 %   Lymphs Abs 1.7 0.7 - 4.0 K/uL   Monocytes Relative 8 %   Monocytes Absolute 1.4 (H) 0.1 - 1.0 K/uL   Eosinophils Relative 0 %   Eosinophils Absolute 0.0 0.0 - 0.5 K/uL   Basophils Relative 1 %   Basophils Absolute 0.1 0.0 - 0.1 K/uL   Immature Granulocytes 0 %   Abs Immature Granulocytes 0.06 0.00 - 0.07 K/uL    Comment: Performed at [facility], [address], [place], [place] [address]  Urinalysis, Routine w reflex microscopic Urine, Clean Catch     Status: Abnormal   Collection Time: 01/18/22  2:07 AM  Result Value Ref Range   Color, Urine YELLOW YELLOW   APPearance CLEAR CLEAR   Specific Gravity, Urine 1.009 1.005 - 1.030    pH 8.0 5.0 - 8.0   Glucose, UA NEGATIVE NEGATIVE mg/dL   Hgb urine dipstick NEGATIVE NEGATIVE   Bilirubin Urine NEGATIVE NEGATIVE   Ketones, ur 5 (A) NEGATIVE mg/dL   Protein, ur NEGATIVE NEGATIVE mg/dL   Nitrite NEGATIVE NEGATIVE   Leukocytes,Ua NEGATIVE NEGATIVE   RBC / HPF  0-5 0 - 5 RBC/hpf   WBC, UA 0-5 0 - 5 WBC/hpf   Bacteria, UA NONE SEEN NONE SEEN    Comment: Performed at [facility], [address] [place], [place] [id number]   CT ABDOMEN PELVIS W CONTRAST  Result Date: 01/18/2022 CLINICAL DATA:  Right lower quadrant pain EXAM: CT ABDOMEN AND PELVIS WITH CONTRAST TECHNIQUE: Multidetector CT imaging of the abdomen and pelvis was performed using the standard protocol following bolus administration of intravenous contrast. RADIATION DOSE REDUCTION: This exam was performed according to the departmental dose-optimization program which includes automated exposure control, adjustment of the mA and/or kV according to patient size and/or use of iterative reconstruction technique. CONTRAST:  100mL OMNIPAQUE IOHEXOL 300 MG/ML  SOLN COMPARISON:  None Available. FINDINGS: Lower chest: No acute abnormality. Hepatobiliary: No focal liver abnormality is seen. No gallstones, gallbladder wall thickening, or biliary dilatation. Pancreas: Unremarkable. No pancreatic ductal dilatation or surrounding inflammatory changes. Spleen: Normal in size without focal abnormality. Adrenals/Urinary Tract: Adrenal glands are unremarkable. Kidneys are normal, without renal calculi, focal lesion, or hydronephrosis. Bladder is unremarkable. Stomach/Bowel: The stomach is nonenlarged. No dilated small bowel. The proximal appendix appears within normal limits. There is dilatation of the distal appendix and tip, coronal series 5, image 68 measuring up to 12 mm. Appendicoliths within the lumen measuring 11 mm. Negative for extraluminal gas. Vascular/Lymphatic: No significant vascular findings are present. Mild  atherosclerosis. No enlarged abdominal or pelvic lymph nodes. Reproductive: Prostate is unremarkable. Other: Negative for pelvic effusion or free air Musculoskeletal: No acute or significant osseous findings. IMPRESSION: 1. Findings consistent with acute appendicitis. Appendix: Location: Right lower quadrant Diameter: 12 mm Appendicolith: Positive Mucosal hyperenhancement: Negative Extraluminal gas: Negative Periappendical collection: Negative Electronically Signed   By: Jasmine PangKim  Fujinaga M.D.   On: 01/18/2022 03:27      Assessment/Plan This is a 41 yo male presenting with acute appendicitis without perforation. I personally reviewed his CT. I recommended laparoscopic appendectomy and reviewed the details of this with the patient, and he agrees to proceed. Surgery will be done by Dr. Gerrit FriendsGerkin later today. Of note he was recently admitted with rhabdomyolysis but seems to have fully recovered. - NPO, IV fluid hydration - CK pending, has not been checked since discharge - IV antibiotics given in ED - VTE: lovenox, SCDs - Dispo: admit to observation   Sophronia SimasShelby Damascus Feldpausch, MD [facility], Hepatobiliary and Pancreatic Surgery 01/18/22 7:21 AM

## 2022-01-18 NOTE — Transfer of Care (Signed)
Immediate Anesthesia Transfer of Care Note  Patient: Brandon Jensen  Procedure(s) Performed: APPENDECTOMY LAPAROSCOPIC  Patient Location: PACU  Anesthesia Type:General  Level of Consciousness: awake, alert  and oriented  Airway & Oxygen Therapy: Patient Spontanous Breathing and Patient connected to face mask oxygen  Post-op Assessment: Report given to RN and Post -op Vital signs reviewed and stable  Post vital signs: Reviewed and stable  Last Vitals:  Vitals Value Taken Time  BP 132/82 01/18/22 1354  Temp    Pulse 102 01/18/22 1355  Resp 15 01/18/22 1355  SpO2 100 % 01/18/22 1355  Vitals shown include unvalidated device data.  Last Pain:  Vitals:   01/18/22 0958  TempSrc: Oral  PainSc:          Complications: No notable events documented.

## 2022-01-18 NOTE — Op Note (Signed)
OPERATIVE REPORT - LAPAROSCOPIC APPENDECTOMY  Preop diagnosis:  Acute appendicitis  Postop diagnosis:  same  Procedure:  Laparoscopic appendectomy  Surgeon:  Darnell Level, MD  Anesthesia:  general endotracheal  Estimated blood loss:  minimal  Preparation:  Chlora-prep  Complications:  none  Indications: Patient is a 41 year old male admitted from the emergency department to the surgical service with signs and symptoms of acute appendicitis.  Patient is now prepared and brought to the operating room for appendectomy.  Procedure:  Patient was brought to the operating room and placed in a supine position on the operating room table. Following administration of general anesthesia, a time out was held and the patient's name and procedure was confirmed. Patient was then prepped and draped in the usual strict aseptic fashion.  After ascertaining that an adequate level of anesthesia had been achieved, a peri-umbilical incision was made with a #15 blade. Dissection was carried down to the fascia. Fascia was incised in the midline and the peritoneal cavity was entered cautiously. A #0-vicryl pursestring suture was placed in the fascia. An Hassan cannula was introduced under direct vision and secured with the pursestring suture. The abdomen was insufflated with carbon dioxide. The laparoscope was introduced and the abdomen was explored. Operative ports were placed in the right upper quadrant and left lower quadrant.  The appendix was identified in the right lower quadrant.  The distal third of the appendix appears to be frankly necrotic.  There was no evidence of perforation.  There is no sign of abscess.  The mid and proximal portion of the appendix appear relatively normal.  Using the harmonic scalpel the mesoappendix is divided.  Dissection is carried down to the base of the appendix.  The base of the appendix is transected at the junction with the cecal wall using an Endo GIA stapler.  The  appendix was placed into an endo-catch bag and withdrawn through the umbilical port. The #0-vicryl pursestring suture was tied securely.  Right lower quadrant was irrigated with warm saline which was evacuated. Good hemostasis was noted. Ports were removed under direct vision. Good hemostasis was noted at the port sites. Pneumoperitoneum was released.  Skin incisions were anesthetized with local anesthetic. Wounds were closed with interrupted 4-0 Monocryl subcuticular sutures. Wounds were washed and dried and Dermabond was applied. The patient was awakened from anesthesia and brought to the recovery room. The patient tolerated the procedure well.  Darnell Level, MD [facility] Surgery Office: [phone]

## 2022-01-18 NOTE — Discharge Instructions (Signed)
[facility] LAPAROSCOPIC SURGERY: POST OP INSTRUCTIONS Always review your discharge instruction sheet given to you by the facility where your surgery was performed. IF YOU HAVE DISABILITY OR FAMILY LEAVE FORMS, YOU MUST BRING THEM TO THE OFFICE FOR PROCESSING.   DO NOT GIVE THEM TO YOUR DOCTOR.  PAIN CONTROL  First take acetaminophen (Tylenol) AND/or ibuprofen (Advil) to control your pain after surgery.  Follow directions on package.  Taking acetaminophen (Tylenol) and/or ibuprofen (Advil) regularly after surgery will help to control your pain and lower the amount of prescription pain medication you may need.  You should not take more than 3,000 mg (3 grams) of acetaminophen (Tylenol) in 24 hours.  You should not take ibuprofen (Advil), aleve, motrin, naprosyn or other NSAIDS if you have a history of stomach ulcers or chronic kidney disease.  A prescription for pain medication may be given to you upon discharge.  Take your pain medication as prescribed, if you still have uncontrolled pain after taking acetaminophen (Tylenol) or ibuprofen (Advil). Use ice packs to help control pain. If you need a refill on your pain medication, please contact your pharmacy.  They will contact our office to request authorization. Prescriptions will not be filled after 5pm or on week-ends.  HOME MEDICATIONS Take your usually prescribed medications unless otherwise directed.  DIET You should follow a light diet the first few days after arrival home.  Be sure to include lots of fluids daily. Avoid fatty, fried foods.   CONSTIPATION It is common to experience some constipation after surgery and if you are taking pain medication.  Increasing fluid intake and taking a stool softener (such as Colace) will usually help or prevent this problem from occurring.  A mild laxative (Milk of Magnesia or Miralax) should be taken according to package instructions if there are no bowel movements after 48  hours.  WOUND/INCISION CARE Most patients will experience some swelling and bruising in the area of the incisions.  Ice packs will help.  Swelling and bruising can take several days to resolve.  Unless discharge instructions indicate otherwise, follow guidelines below  STERI-STRIPS - you may remove your outer bandages 48 hours after surgery, and you may shower at that time.  You have steri-strips (small skin tapes) in place directly over the incision.  These strips should be left on the skin for 7-10 days.   DERMABOND/SKIN GLUE - you may shower in 24 hours.  The glue will flake off over the next 2-3 weeks. Any sutures or staples will be removed at the office during your follow-up visit.  ACTIVITIES You may resume regular (light) daily activities beginning the next day--such as daily self-care, walking, climbing stairs--gradually increasing activities as tolerated.  You may have sexual intercourse when it is comfortable.  Refrain from any heavy lifting or straining until approved by your doctor. You may drive when you are no longer taking prescription pain medication, you can comfortably wear a seatbelt, and you can safely maneuver your car and apply brakes.  FOLLOW-UP You should see your doctor in the office for a follow-up appointment approximately 2-3 weeks after your surgery.  You should have been given your post-op/follow-up appointment when your surgery was scheduled.  If you did not receive a post-op/follow-up appointment, make sure that you call for this appointment within a day or two after you arrive home to insure a convenient appointment time.   WHEN TO CALL YOUR DOCTOR: Fever over 101.0 Inability to urinate Continued bleeding from incision.   Increased pain, redness, or drainage from the incision. Increasing abdominal pain  The clinic staff is available to answer your questions during regular business hours.  Please don't hesitate to call and ask to speak to one of the nurses for  clinical concerns.  If you have a medical emergency, go to the nearest emergency room or call 911.  A surgeon from [facility] is always on call at the hospital. [address], [address], [place], [place]  [address] ? [address] [address], [place], [place]   [address] [phone] ? [phone] ? FAX [phone] Web site: [email or web address]  

## 2022-01-18 NOTE — ED Provider Notes (Signed)
[facility] Provider Note   CSN: [id number] Arrival date & time: 01/17/22  2044     History  Chief Complaint  Patient presents with   Abdominal Pain    Brandon Jensen is a 41 y.o. male.  The history is provided by the patient.  Abdominal Pain He has history of hyperlipidemia and recent hospitalization for rhabdomyolysis and comes in complaining of right lower quadrant pain which he noticed will when he woke up this morning.  Pain has been constant and nonradiating.  There has been associated nausea without vomiting.  Appetite was initially diminished, has started to return.  He denies constipation diarrhea or urinary difficulty.  He denies fever, chills, sweats.  Please note that the triage note states it is left lower quadrant pain and that is incorrect, he is clearly complaining of pain in the right lower quadrant.   Home Medications Prior to Admission medications   Medication Sig Start Date End Date Taking? Authorizing Provider  ALPRAZolam Prudy Feeler(XANAX) 1 MG tablet Take 1 tablet (1 mg total) by mouth daily as needed for anxiety. Patient taking differently: Take 0.5-1 mg by mouth daily as needed for anxiety. 01/01/22   [name]'Sullivan, Melissa, NP  pantoprazole (PROTONIX) 40 MG tablet TAKE 1 TABLET (40 MG TOTAL) BY MOUTH DAILY. Patient taking differently: Take 40 mg by mouth every evening. 07/31/21 07/31/22  [name]'Sullivan, Melissa, NP  PARoxetine (PAXIL) 40 MG tablet TAKE 1 TABLET (40 MG TOTAL) BY MOUTH EVERY MORNING. Patient taking differently: Take 40 mg by mouth every evening. 07/31/21 07/31/22  [name]'Sullivan, Melissa, NP  tadalafil (CIALIS) 20 MG tablet TAKE 1/2-1 TABLET (10-20 MG TOTAL) BY MOUTH DAILY AS NEEDED FOR ERECTILE DYSFUNCTION. 07/31/21 07/31/22  [name]'Sullivan, Melissa, NP      Allergies    Beef-derived products, Corn-containing products, Eggs or egg-derived products, Milk-related compounds, Peanut-containing drug products, Red dye, and Wheat     Review of Systems   Review of Systems  Gastrointestinal:  Positive for abdominal pain.  All other systems reviewed and are negative.  Physical Exam Updated Vital Signs BP 125/72 (BP Location: Right Arm)   Pulse 84   Temp 98.5 F (36.9 C) (Oral)   Resp 15   Ht 5\' 10"  (1.778 m)   Wt 70.3 kg   SpO2 100%   BMI 22.24 kg/m  Physical Exam Vitals and nursing note reviewed.  41 year old male, resting comfortably and in no acute distress. Vital signs are normal. Oxygen saturation is 100%, which is normal. Head is normocephalic and atraumatic. PERRLA, EOMI. Oropharynx is clear. Neck is nontender and supple without adenopathy or JVD. Back is nontender and there is no CVA tenderness. Lungs are clear without rales, wheezes, or rhonchi. Chest is nontender. Heart has regular rate and rhythm without murmur. Abdomen is soft, flat, with moderate right lower quadrant tenderness.  There is some voluntary guarding.  Rebound tenderness is present.  Peristalsis is hypoactive. Extremities have no cyanosis or edema, full range of motion is present. Skin is warm and dry without rash. Neurologic: Mental status is normal, cranial nerves are intact, moves all extremities equally.  ED Results / Procedures / Treatments   Labs (all labs ordered are listed, but only abnormal results are displayed) Labs Reviewed  COMPREHENSIVE METABOLIC PANEL - Abnormal; Notable for the following components:      Result Value   Glucose, Bld 131 (*)    ALT 78 (*)    Total Bilirubin 1.5 (*)    All other  components within normal limits  CBC WITH DIFFERENTIAL/PLATELET - Abnormal; Notable for the following components:   WBC 17.7 (*)    MCHC 36.1 (*)    Neutro Abs 14.5 (*)    Monocytes Absolute 1.4 (*)    All other components within normal limits  LIPASE, BLOOD  URINALYSIS, ROUTINE W REFLEX MICROSCOPIC   Radiology CT ABDOMEN PELVIS W CONTRAST  Result Date: 01/18/2022 CLINICAL DATA:  Right lower quadrant pain EXAM: CT  ABDOMEN AND PELVIS WITH CONTRAST TECHNIQUE: Multidetector CT imaging of the abdomen and pelvis was performed using the standard protocol following bolus administration of intravenous contrast. RADIATION DOSE REDUCTION: This exam was performed according to the departmental dose-optimization program which includes automated exposure control, adjustment of the mA and/or kV according to patient size and/or use of iterative reconstruction technique. CONTRAST:  OMNIPAQUE IOHEXOL 300 MG/ML  SOLN COMPARISON:  None Available. FINDINGS: Lower chest: No acute abnormality. Hepatobiliary: No focal liver abnormality is seen. No gallstones, gallbladder wall thickening, or biliary dilatation. Pancreas: Unremarkable. No pancreatic ductal dilatation or surrounding inflammatory changes. Spleen: Normal in size without focal abnormality. Adrenals/Urinary Tract: Adrenal glands are unremarkable. Kidneys are normal, without renal calculi, focal lesion, or hydronephrosis. Bladder is unremarkable. Stomach/Bowel: The stomach is nonenlarged. No dilated small bowel. The proximal appendix appears within normal limits. There is dilatation of the distal appendix and tip, coronal series 5, image 68 measuring up to 12 mm. Appendicoliths within the lumen measuring 11 mm. Negative for extraluminal gas. Vascular/Lymphatic: No significant vascular findings are present. Mild atherosclerosis. No enlarged abdominal or pelvic lymph nodes. Reproductive: Prostate is unremarkable. Other: Negative for pelvic effusion or free air Musculoskeletal: No acute or significant osseous findings. IMPRESSION: 1. Findings consistent with acute appendicitis. Appendix: Location: Right lower quadrant Diameter: 12 mm Appendicolith: Positive Mucosal hyperenhancement: Negative Extraluminal gas: Negative Periappendical collection: Negative Electronically Signed   By: Jasmine Pang M.D.   On: 01/18/2022 03:27    Procedures Procedures  Cardiac monitor shows normal sinus  rhythm, per my interpretation.  Medications Ordered in ED Medications  ondansetron (ZOFRAN-ODT) disintegrating tablet 4 mg (4 mg Oral Given 01/17/22 2140)    ED Course/ Medical Decision Making/ A&P                           Medical Decision Making Amount and/or Complexity of Data Reviewed Labs: ordered. Radiology: ordered.  Risk Prescription drug management. Decision regarding hospitalization.   Right lower quadrant pain concerning for appendicitis.  Differential diagnosis includes, but is not limited to, urinary tract infection, pyelonephritis, urolithiasis, diverticulitis.  I have reviewed and interpreted all of his laboratory testing and he has moderate leukocytosis with a left shift.  This is nonspecific.  Random glucose is mildly elevated, possibly stress related.  Mild elevation of ALT is decreased compared with 5/25 and may be residual from his rhabdomyolysis.  Total bilirubin is mildly elevated, also possibly related to his recent episode of rhabdomyolysis.  Old records reviewed showing hospitalization on 01/06/2021 for rhabdomyolysis with peak CK level almost 49,000.  He is given IV fluids, morphine for pain, ondansetron for nausea and will be sent for CT of abdomen and pelvis to look for evidence of appendicitis.  CT scan shows evidence of acute appendicitis with presence of appendicolith.  I have independently viewed these images, and agree with radiologist interpretation.  He is started on antibiotics for appendicitis, ceftriaxone and metronidazole.  He has not achieved adequate pain relief with morphine,  is given a dose of hydromorphone.  Case is discussed with Dr. Freida Busman, on-call for general surgery, who is coming to evaluate the patient for probable surgical management.  Final Clinical Impression(s) / ED Diagnoses Final diagnoses:  Acute appendicitis with localized peritonitis, without perforation, abscess, or gangrene  Elevated ALT measurement  Serum total bilirubin elevated   Elevated random blood glucose level    Rx / DC Orders ED Discharge Orders     None         Dione Booze, MD 01/18/22 [phone]

## 2022-01-18 NOTE — Interval H&P Note (Signed)
History and Physical Interval Note:  01/18/2022 12:10 PM  Brandon Jensen  has presented today for surgery, with the diagnosis of acute appendicitis.  The various methods of treatment have been discussed with the patient and family. After consideration of risks, benefits and other options for treatment, the patient has consented to    Procedure(s): APPENDECTOMY LAPAROSCOPIC (N/A) as a surgical intervention.    The patient's history has been reviewed, patient examined, no change in status, stable for surgery.  I have reviewed the patient's chart and labs.  Questions were answered to the patient's satisfaction.    Darnell Level, MD [facility] practice Office: [phone]   Darnell Level

## 2022-01-18 NOTE — Anesthesia Preprocedure Evaluation (Addendum)
Anesthesia Evaluation  Patient identified by MRN, date of birth, ID band Patient awake    Reviewed: Allergy & Precautions, NPO status , Patient's Chart, lab work & pertinent test results  Airway Mallampati: II  TM Distance: >3 FB Neck ROM: Full    Dental no notable dental hx.    Pulmonary sleep apnea , former smoker,    Pulmonary exam normal breath sounds clear to auscultation       Cardiovascular negative cardio ROS Normal cardiovascular exam Rhythm:Regular Rate:Normal     Neuro/Psych Anxiety negative neurological ROS  negative psych ROS   GI/Hepatic Neg liver ROS, GERD  ,Acute appendicitis   Endo/Other  negative endocrine ROS  Renal/GU negative Renal ROS  negative genitourinary   Musculoskeletal negative musculoskeletal ROS (+)   Abdominal   Peds negative pediatric ROS (+)  Hematology negative hematology ROS (+)   Anesthesia Other Findings   Reproductive/Obstetrics negative OB ROS                            Anesthesia Physical Anesthesia Plan  ASA: 2 and emergent  Anesthesia Plan: General   Post-op Pain Management: Tylenol PO (pre-op)*   Induction: Intravenous and Rapid sequence  PONV Risk Score and Plan: 2 and Treatment may vary due to age or medical condition, Ondansetron, Dexamethasone and Midazolam  Airway Management Planned: Oral ETT  Additional Equipment: None  Intra-op Plan:   Post-operative Plan: Extubation in OR  Informed Consent: I have reviewed the patients History and Physical, chart, labs and discussed the procedure including the risks, benefits and alternatives for the proposed anesthesia with the patient or authorized representative who has indicated his/her understanding and acceptance.     Dental advisory given  Plan Discussed with: CRNA, Anesthesiologist and Surgeon  Anesthesia Plan Comments:        Anesthesia Quick Evaluation

## 2022-01-18 NOTE — Anesthesia Postprocedure Evaluation (Signed)
Anesthesia Post Note  Patient: Brandon Jensen  Procedure(s) Performed: APPENDECTOMY LAPAROSCOPIC     Patient location during evaluation: PACU Anesthesia Type: General Level of consciousness: awake and alert and oriented Pain management: pain level controlled Vital Signs Assessment: post-procedure vital signs reviewed and stable Respiratory status: spontaneous breathing, nonlabored ventilation and respiratory function stable Cardiovascular status: blood pressure returned to baseline and stable Postop Assessment: no apparent nausea or vomiting Anesthetic complications: no   No notable events documented.  Last Vitals:  Vitals:   01/18/22 1415 01/18/22 1430  BP: 121/69 113/64  Pulse: (!) 102 98  Resp: 16 (!) 21  Temp:    SpO2: 95% 97%    Last Pain:  Vitals:   01/18/22 1415  TempSrc:   PainSc: 6                  Jasier Calabretta A.

## 2022-01-19 ENCOUNTER — Encounter (HOSPITAL_COMMUNITY): Payer: Self-pay | Admitting: Surgery

## 2022-01-19 MED ORDER — ACETAMINOPHEN 325 MG PO TABS: 650.0000 mg | ORAL_TABLET | Freq: Four times a day (QID) | ORAL | Status: DC | PRN

## 2022-01-19 MED ORDER — OXYCODONE HCL 5 MG PO TABS: 5.0000 mg | ORAL_TABLET | ORAL | 0 refills | Status: DC | PRN

## 2022-01-19 MED ORDER — IBUPROFEN 600 MG PO TABS: 600.0000 mg | ORAL_TABLET | Freq: Four times a day (QID) | ORAL | 0 refills | Status: DC | PRN

## 2022-01-19 NOTE — Discharge Summary (Signed)
[facility] Discharge Summary   Patient ID: Brandon Jensen MRN: [id number] DOB/AGE: 41-Feb-1982 41 y.o.  Admit date: 01/17/2022 Discharge date: 01/19/2022  Admitting Diagnosis: Acute appendicitis   Discharge Diagnosis Acute appendicitis s/p laparoscopic appendectomy   Consultants None   Imaging: CT ABDOMEN PELVIS W CONTRAST  Result Date: 01/18/2022 CLINICAL DATA:  Right lower quadrant pain EXAM: CT ABDOMEN AND PELVIS WITH CONTRAST TECHNIQUE: Multidetector CT imaging of the abdomen and pelvis was performed using the standard protocol following bolus administration of intravenous contrast. RADIATION DOSE REDUCTION: This exam was performed according to the departmental dose-optimization program which includes automated exposure control, adjustment of the mA and/or kV according to patient size and/or use of iterative reconstruction technique. CONTRAST:  OMNIPAQUE IOHEXOL 300 MG/ML  SOLN COMPARISON:  None Available. FINDINGS: Lower chest: No acute abnormality. Hepatobiliary: No focal liver abnormality is seen. No gallstones, gallbladder wall thickening, or biliary dilatation. Pancreas: Unremarkable. No pancreatic ductal dilatation or surrounding inflammatory changes. Spleen: Normal in size without focal abnormality. Adrenals/Urinary Tract: Adrenal glands are unremarkable. Kidneys are normal, without renal calculi, focal lesion, or hydronephrosis. Bladder is unremarkable. Stomach/Bowel: The stomach is nonenlarged. No dilated small bowel. The proximal appendix appears within normal limits. There is dilatation of the distal appendix and tip, coronal series 5, image 68 measuring up to 12 mm. Appendicoliths within the lumen measuring 11 mm. Negative for extraluminal gas. Vascular/Lymphatic: No significant vascular findings are present. Mild atherosclerosis. No enlarged abdominal or pelvic lymph nodes. Reproductive: Prostate is unremarkable. Other: Negative for pelvic effusion or free air  Musculoskeletal: No acute or significant osseous findings. IMPRESSION: 1. Findings consistent with acute appendicitis. Appendix: Location: Right lower quadrant Diameter: 12 mm Appendicolith: Positive Mucosal hyperenhancement: Negative Extraluminal gas: Negative Periappendical collection: Negative Electronically Signed   By: Jasmine Pang M.D.   On: 01/18/2022 03:27    Procedures Dr. Darnell Level (01/18/22) - Laparoscopic Appendectomy  Hospital Course:  Patient is a 41 year old male who presented to the ED with abdominal pain.  Workup showed acute appendicitis.  Patient was admitted and underwent procedure listed above.  Tolerated procedure well and was transferred to the floor.  Diet was advanced as tolerated.  On POD1, the patient was voiding well, tolerating diet, ambulating well, pain well controlled, vital signs stable, incisions c/d/i and felt stable for discharge home.  Patient will follow up in our office in 3-4 weeks and knows to call with questions or concerns.  He will call to confirm appointment date/time.    Physical Exam: General:  Alert, NAD, pleasant, comfortable Abd:  Soft, ND, mild tenderness, incisions C/D/I  I or a member of my team have reviewed this patient in the Controlled Substance Database.   Allergies as of 01/19/2022       Reactions   Beef-derived Products    Confirmed through allergy testing   Corn-containing Products    Confirmed through allergy testing   Eggs Or Egg-derived Products    Confirmed through allergy testing   Milk-related Compounds    Confirmed through allergy testing   Peanut-containing Drug Products    Confirmed through allergy testing   Red Dye Hives   Patient reported not having a reaction, so not sure   Wheat    Confirmed through allergy testing        Medication List     TAKE these medications    acetaminophen 325 MG tablet Commonly known as: TYLENOL Take 2 tablets (650 mg total) by mouth every 6 (six)  hours as needed for mild pain  or fever (or Fever >/= 101).   ALPRAZolam 1 MG tablet Commonly known as: XANAX Take 1 tablet (1 mg total) by mouth daily as needed for anxiety. What changed: how much to take   atorvastatin 40 MG tablet Commonly known as: LIPITOR Take 40 mg by mouth daily.   ibuprofen 600 MG tablet Commonly known as: ADVIL Take 1 tablet (600 mg total) by mouth every 6 (six) hours as needed for moderate pain. Take with food   oxyCODONE 5 MG immediate release tablet Commonly known as: Oxy IR/ROXICODONE Take 1 tablet (5 mg total) by mouth every 4 (four) hours as needed for moderate pain.   pantoprazole 40 MG tablet Commonly known as: PROTONIX TAKE 1 TABLET (40 MG TOTAL) BY MOUTH DAILY.   PARoxetine 40 MG tablet Commonly known as: PAXIL TAKE 1 TABLET (40 MG TOTAL) BY MOUTH EVERY MORNING. What changed:  how much to take how to take this when to take this   tadalafil 20 MG tablet Commonly known as: CIALIS TAKE 1/2-1 TABLET (10-20 MG TOTAL) BY MOUTH DAILY AS NEEDED FOR ERECTILE DYSFUNCTION.          Follow-up Information     [facility], [facility]. Schedule an appointment as soon as possible for a visit in 3 week(s).   Specialty: General Surgery Why: Our office is scheduling a follow up appointment in about 3 weeks, please call to confirm appointment date/time. Please arrive 30 min prior to appointment time and have ID and insurance card with you. Contact information: [address] [place] [address] [phone]                 Signed: Juliet Rude , [facility] 01/19/2022, 9:49 AM Please see Amion for pager number during day hours 7:00am-4:30pm

## 2022-01-19 NOTE — Progress Notes (Signed)
Transition of Care [facility]) Screening Note  Patient Details  Name: Brandon Jensen Date of Birth: 01/06/1981  Transition of Care [facility]) CM/SW Contact:    Sherie Don, LCSW Phone Number: 01/19/2022, 11:43 AM  Transition of Care Department [facility]) has reviewed patient and no TOC needs have been identified at this time. We will continue to monitor patient advancement through interdisciplinary progression rounds. If new patient transition needs arise, please place a TOC consult.

## 2022-01-19 NOTE — Plan of Care (Signed)
  Problem: Education: Goal: Required Educational Video(s) Outcome: Adequate for Discharge   Problem: Clinical Measurements: Goal: Ability to maintain clinical measurements within normal limits will improve Outcome: Adequate for Discharge Goal: Postoperative complications will be avoided or minimized Outcome: Adequate for Discharge   Problem: Skin Integrity: Goal: Demonstration of wound healing without infection will improve Outcome: Adequate for Discharge   

## 2022-01-19 NOTE — Progress Notes (Signed)
24 hour chart audit completed 

## 2022-01-20 ENCOUNTER — Ambulatory Visit: Payer: 59 | Admitting: Family

## 2022-01-20 VITALS — BP 130/85 | HR 71 | Temp 98.6°F | Resp 16 | Wt 156.0 lb

## 2022-01-20 DIAGNOSIS — Z09 Encounter for follow-up examination after completed treatment for conditions other than malignant neoplasm: Secondary | ICD-10-CM | POA: Diagnosis not present

## 2022-01-20 DIAGNOSIS — M6282 Rhabdomyolysis: Secondary | ICD-10-CM | POA: Diagnosis not present

## 2022-01-20 DIAGNOSIS — E782 Mixed hyperlipidemia: Secondary | ICD-10-CM

## 2022-01-20 DIAGNOSIS — R300 Dysuria: Secondary | ICD-10-CM | POA: Diagnosis not present

## 2022-01-20 DIAGNOSIS — K3580 Unspecified acute appendicitis: Secondary | ICD-10-CM | POA: Diagnosis not present

## 2022-01-20 LAB — SURGICAL PATHOLOGY

## 2022-01-20 NOTE — Progress Notes (Signed)
Subjective:     Patient ID: Brandon Jensen, male    DOB: Jan 03, 1981, 41 y.o.   MRN: [id number]  Chief Complaint  Patient presents with   Rhabdomyolysis    Follow up after appendectomy 01-17-22    HPI Patient is in today for hospital follow up. He was admitted on 01/06/22 for rhabdomyolitis in the setting of a new strenuous work out routine.  He then presented on 01/17/22 to the ED with acute appendicitis and underwent subsequent appendectomy. Notes some abdominal soreness since returning home.  He has some dysuria following his catheterization for his surgery.    Wt Readings from Last 3 Encounters:  01/20/22 156 lb (70.8 kg)  01/17/22 155 lb (70.3 kg)  01/06/22 155 lb (70.3 kg)     Health Maintenance Due  Topic Date Due   URINE MICROALBUMIN  Never done   Hepatitis C Screening  Never done    Past Medical History:  Diagnosis Date   Allergy    Anxiety    GERD (gastroesophageal reflux disease)    Hyperlipemia    Obstructive sleep apnea 01/18/2011    Past Surgical History:  Procedure Laterality Date   INGUINAL HERNIA REPAIR     LAPAROSCOPIC APPENDECTOMY N/A 01/18/2022   Procedure: APPENDECTOMY LAPAROSCOPIC;  Surgeon: Darnell LevelGerkin, Todd, MD;  Location: [facility];  Service: General;  Laterality: N/A;    Family History  Problem Relation Age of Onset   CAD Father 4164       s/p cabg x 3   Anxiety disorder Sister    Kidney disease Maternal Grandmother    Colon cancer Maternal Grandfather 88   Bladder Cancer Maternal Grandfather    Skin cancer Maternal Grandfather    Stroke Maternal Grandfather    Hypertension Paternal Grandfather    Stroke Paternal Grandfather    Prostate cancer Paternal Grandfather    Heart disease Paternal Grandfather    Stomach cancer Neg Hx     Social History   Socioeconomic History   Marital status: Divorced    Spouse name: Sales promotion account executiveBethany   Number of children: 2   Years of education: Not on file   Highest education level: Not on file  Occupational History    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    Employer: [employer]   Occupation: Engineer, materialssecurity officer  Tobacco Use   Smoking status: Former    Types: Cigarettes    Quit date: 11/11/2013    Years since quitting: 8.2   Smokeless tobacco: Current    Types: Snuff  Vaping Use   Vaping Use: Never used  Substance and Sexual Activity   Alcohol use: No    Alcohol/week: 0.0 standard drinks of alcohol    Comment: 1-2 drinks a month   Drug use: No   Sexual activity: Yes    Partners: Female  Other Topics Concern   Not on file  Social History Narrative   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 Works three 12 hour shifts- night shifts   2 children   Married      International aid/development workerocial Determinants of Corporate investment bankerHealth   Financial Resource Strain: Not on file  Food Insecurity: Not on file  Transportation Needs: Not on file  Physical Activity: Not on file  Stress: Not on file  Social Connections: Not on file  Intimate Partner Violence: Not on file    Outpatient Medications Prior to Visit  Medication Sig Dispense Refill   acetaminophen (TYLENOL) 325 MG tablet Take 2 tablets (650 mg total) by mouth every 6 (  six) hours as needed for mild pain or fever (or Fever >/= 101).     ALPRAZolam (XANAX) 1 MG tablet Take 1 tablet (1 mg total) by mouth daily as needed for anxiety. (Patient taking differently: Take 0.5-1 mg by mouth daily as needed for anxiety.) 30 tablet 0   ibuprofen (ADVIL) 600 MG tablet Take 1 tablet (600 mg total) by mouth every 6 (six) hours as needed for moderate pain. Take with food 30 tablet 0   oxyCODONE (OXY IR/ROXICODONE) 5 MG immediate release tablet Take 1 tablet (5 mg total) by mouth every 4 (four) hours as needed for moderate pain. 15 tablet 0   pantoprazole (PROTONIX) 40 MG tablet TAKE 1 TABLET (40 MG TOTAL) BY MOUTH DAILY. 90 tablet 1   PARoxetine (PAXIL) 40 MG tablet TAKE 1 TABLET (40 MG TOTAL) BY MOUTH EVERY MORNING. (Patient taking differently: Take 40 mg by mouth every evening.) 90 tablet 1   tadalafil  (CIALIS) 20 MG tablet TAKE 1/2-1 TABLET (10-20 MG TOTAL) BY MOUTH DAILY AS NEEDED FOR ERECTILE DYSFUNCTION. (Patient not taking: Reported on 01/18/2022) 20 tablet 3   atorvastatin (LIPITOR) 40 MG tablet Take 40 mg by mouth daily.     No facility-administered medications prior to visit.    Allergies  Allergen Reactions   Beef-Derived Products     Confirmed through allergy testing   Corn-Containing Products     Confirmed through allergy testing   Eggs Or Egg-Derived Products     Confirmed through allergy testing   Lipitor [Atorvastatin] Other (See Comments)    rhabdomyolysis   Milk-Related Compounds     Confirmed through allergy testing   Peanut-Containing Drug Products     Confirmed through allergy testing   Red Dye Hives    Patient reported not having a reaction, so not sure   Wheat     Confirmed through allergy testing    ROS    See HPI Objective:    Physical Exam Constitutional:      General: He is not in acute distress.    Appearance: He is well-developed.  HENT:     Head: Normocephalic and atraumatic.  Cardiovascular:     Rate and Rhythm: Normal rate and regular rhythm.     Heart sounds: No murmur heard. Pulmonary:     Effort: Pulmonary effort is normal. No respiratory distress.     Breath sounds: Normal breath sounds. No wheezing or rales.  Abdominal:     General: There is no distension.     Palpations: Abdomen is soft.     Tenderness: There is no abdominal tenderness.     Comments: Lap incisions clean dry and intact and are covered with dermabond  Skin:    General: Skin is warm and dry.  Neurological:     Mental Status: He is alert and oriented to person, place, and time.  Psychiatric:        Behavior: Behavior normal.        Thought Content: Thought content normal.     BP 130/85 (BP Location: Right Arm, Patient Position: Sitting, Cuff Size: Small)   Pulse 71   Temp 98.6 F (37 C) (Oral)   Resp 16   Wt 156 lb (70.8 kg)   SpO2 100%   BMI 22.38 kg/m   Wt Readings from Last 3 Encounters:  01/20/22 156 lb (70.8 kg)  01/17/22 155 lb (70.3 kg)  01/06/22 155 lb (70.3 kg)       Assessment & Plan:  Problem List Items Addressed This Visit       Unprioritized   S/P appendectomy, follow-up exam    Stable post op.  He will schedule follow up with general surgeon as well.       Rhabdomyolysis    Most recent CK level was WNL.  Encouraged good hydration and not to overdue his workouts in the future.       Mixed hyperlipidemia    Will plan to keep him off of statins in the setting of his severe rhabdomyolysis.       Dysuria - Primary    Urine culture negative. Was likely due to foley trauma peri-operatively.       Relevant Orders   Urine Culture (Completed)   Acute appendicitis    Lap incision sites appear clean/dry/intact.  He is doing well post-operatively and also plans to schedule a post-op visit with his surgeon.        I am having Brandon Bal "Brandon Jensen" maintain his pantoprazole, PARoxetine, tadalafil, ALPRAZolam, oxyCODONE, acetaminophen, and ibuprofen.  No orders of the defined types were placed in this encounter.

## 2022-01-21 ENCOUNTER — Telehealth: Payer: Self-pay | Admitting: Family

## 2022-01-21 LAB — URINE CULTURE
MICRO NUMBER:: 13495058
Result:: NO GROWTH
SPECIMEN QUALITY:: ADEQUATE

## 2022-01-21 NOTE — Telephone Encounter (Signed)
Pt called asking if it would be ok for him to go back to taking his atorvastatin. He stated was off of it back when he was in the hospital for rhabdo and is still currently off of it.

## 2022-01-22 ENCOUNTER — Telehealth: Payer: Self-pay

## 2022-01-22 DIAGNOSIS — R300 Dysuria: Secondary | ICD-10-CM | POA: Insufficient documentation

## 2022-01-22 DIAGNOSIS — Z09 Encounter for follow-up examination after completed treatment for conditions other than malignant neoplasm: Secondary | ICD-10-CM | POA: Insufficient documentation

## 2022-01-22 NOTE — Assessment & Plan Note (Signed)
Urine culture negative. Was likely due to foley trauma peri-operatively.

## 2022-01-22 NOTE — Assessment & Plan Note (Signed)
Most recent CK level was WNL.  Encouraged good hydration and not to overdue his workouts in the future.

## 2022-01-22 NOTE — Assessment & Plan Note (Signed)
Lap incision sites appear clean/dry/intact.  He is doing well post-operatively and also plans to schedule a post-op visit with his surgeon.

## 2022-01-22 NOTE — Assessment & Plan Note (Signed)
Stable post op.  He will schedule follow up with general surgeon as well.

## 2022-01-22 NOTE — Assessment & Plan Note (Signed)
Will plan to keep him off of statins in the setting of his severe rhabdomyolysis.

## 2022-01-22 NOTE — Telephone Encounter (Signed)
Transition Care Management Unsuccessful Follow-up Telephone Call  Date of discharge and from where:  [facility] 01/17/22-01/19/22  Attempts:  1st Attempt  Reason for unsuccessful TCM follow-up call:  No answer/busy; Unidentified voicemail, no message left.    Jodelle Gross, RN, BSN, CCM Care Management Coordinator Phone: [phone]/Fax: [phone]

## 2022-01-26 ENCOUNTER — Encounter: Payer: Self-pay | Admitting: Family

## 2022-02-02 ENCOUNTER — Other Ambulatory Visit (HOSPITAL_BASED_OUTPATIENT_CLINIC_OR_DEPARTMENT_OTHER): Payer: Self-pay

## 2022-02-02 ENCOUNTER — Ambulatory Visit: Payer: 59 | Admitting: Family

## 2022-02-02 VITALS — BP 127/89 | HR 100 | Temp 98.8°F | Resp 16 | Wt 153.0 lb

## 2022-02-02 DIAGNOSIS — R21 Rash and other nonspecific skin eruption: Secondary | ICD-10-CM | POA: Insufficient documentation

## 2022-02-02 MED ORDER — HYDROXYZINE PAMOATE 25 MG PO CAPS
25.0000 mg | ORAL_CAPSULE | Freq: Three times a day (TID) | ORAL | 0 refills | Status: DC | PRN
  Filled 2022-02-02: qty 30, 10d supply, fill #0

## 2022-02-02 MED ORDER — HYDROCORTISONE 2.5 % EX CREA
TOPICAL_CREAM | Freq: Two times a day (BID) | CUTANEOUS | 1 refills | Status: DC
  Filled 2022-02-02: qty 30, 15d supply, fill #0

## 2022-02-02 NOTE — Assessment & Plan Note (Signed)
New.  Appears to be allergic type reaction.  Pt is advised as follows:  Apply hydrocortisone cream twice daily to affected areas. Start zyrtec 10mg  once daily.  Add hydroxyzine every 8 hours as needed for itching (may cause drowsiness so don't take before work or driving). Call if symptoms worsen or if not improved in 2-3 days.

## 2022-02-02 NOTE — Progress Notes (Signed)
Subjective:   By signing my name below, I, [name], attest that this documentation has been prepared under the direction and in the presence of [name] NP, 02/02/2022   Patient ID: Brandon Jensen, male    DOB: [birth year], 41 y.o.   MRN: [id number]  Chief Complaint  Patient presents with   Rash    Patient reports skin rash    HPI Patient is in today for an office visit.  Rash: He complains of a rash that he noticed on 01/25/2022. He also has associated symptoms of itchiness of the area. The rashes are located surrounding two of his laparoscopic incision points on his abdomen as well as a few lesions on his right forearm. He informed his general surgeon about his complaints and was told to apply Benadryl or Hydrocortisone cream initially and then ultimately told to follow up with PCP. The medications provided no relief to his symptoms. He uses a cold compress or ice which he states provides temporary relief.   Health Maintenance Due  Topic Date Due   URINE MICROALBUMIN  Never done   Hepatitis C Screening  Never done    Past Medical History:  Diagnosis Date   Allergy    Anxiety    GERD (gastroesophageal reflux disease)    Hyperlipemia    Obstructive sleep apnea 01/18/2011    Past Surgical History:  Procedure Laterality Date   INGUINAL HERNIA REPAIR     LAPAROSCOPIC APPENDECTOMY N/A 01/18/2022   Procedure: APPENDECTOMY LAPAROSCOPIC;  Surgeon: Darnell Level, MD;  Location: [facility];  Service: General;  Laterality: N/A;    Family History  Problem Relation Age of Onset   CAD Father 55       s/p cabg x 3   Anxiety disorder Sister    Kidney disease Maternal Grandmother    Colon cancer Maternal Grandfather 88   Bladder Cancer Maternal Grandfather    Skin cancer Maternal Grandfather    Stroke Maternal Grandfather    Hypertension Paternal Grandfather    Stroke Paternal Grandfather    Prostate cancer Paternal Grandfather    Heart disease Paternal Grandfather     Stomach cancer Neg Hx     Social History   Socioeconomic History   Marital status: Divorced    Spouse name: Sales promotion account executive   Number of children: 2   Years of education: Not on file   Highest education level: Not on file  Occupational History   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: [employer]   Occupation: Engineer, materials  Tobacco Use   Smoking status: Former    Types: Cigarettes    Quit date: 11/11/2013    Years since quitting: 8.2   Smokeless tobacco: Current    Types: Snuff  Vaping Use   Vaping Use: Never used  Substance and Sexual Activity   Alcohol use: No    Alcohol/week: 0.0 standard drinks of alcohol    Comment: 1-2 drinks a month   Drug use: No   Sexual activity: Yes    Partners: Female  Other Topics Concern   Not on file  Social History Narrative   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     ALPRAZolam (XANAX) 1 MG tablet Take 1 tablet (1 mg total) by mouth daily as needed for anxiety. (Patient taking differently: Take 0.5-1 mg by mouth daily as needed for anxiety.) 30 tablet 0   ibuprofen (ADVIL) 600 MG tablet Take 1 tablet (600 mg total) by mouth every 6 (six) hours as needed for moderate pain. Take with food 30 tablet 0   pantoprazole (PROTONIX) 40 MG tablet TAKE 1 TABLET (40 MG TOTAL) BY MOUTH DAILY. 90 tablet 1   PARoxetine (PAXIL) 40 MG tablet TAKE 1 TABLET (40 MG TOTAL) BY MOUTH EVERY MORNING.  (Patient taking differently: Take 40 mg by mouth every evening.) 90 tablet 1   tadalafil (CIALIS) 20 MG tablet TAKE 1/2-1 TABLET (10-20 MG TOTAL) BY MOUTH DAILY AS NEEDED FOR ERECTILE DYSFUNCTION. (Patient not taking: Reported on 01/18/2022) 20 tablet 3   oxyCODONE (OXY IR/ROXICODONE) 5 MG immediate release tablet Take 1 tablet (5 mg total) by mouth every 4 (four) hours as needed for moderate pain. (Patient not taking: Reported on 02/02/2022) 15 tablet 0   No facility-administered medications prior to visit.    Allergies  Allergen Reactions   Beef-Derived Products     Confirmed through allergy testing   Corn-Containing Products     Confirmed through allergy testing   Eggs Or Egg-Derived Products     Confirmed through allergy testing   Lipitor [Atorvastatin] Other (See Comments)    rhabdomyolysis   Milk-Related Compounds     Confirmed through allergy testing   Peanut-Containing Drug Products     Confirmed through allergy testing   Red Dye Hives    Patient reported not having a reaction, so not sure   Wheat     Confirmed through allergy testing    Review of Systems  Skin:  Positive for itching (Abdomen and Right Forearm) and rash (Abdomen and Right Forearm).       Objective:    Physical Exam Constitutional:      General: He is not in acute distress.    Appearance: Normal appearance. He is not ill-appearing.  HENT:     Head: Normocephalic and atraumatic.     Right Ear: External ear normal.     Left Ear: External ear normal.  Eyes:     Extraocular Movements: Extraocular movements intact.     Pupils: Pupils are equal, round, and reactive to light.  Skin:    General: Skin is warm and dry.     Findings: Rash present. Rash is macular.     Comments: Small raised macular rash on abdomen and right forearm  Neurological:     Mental Status: He is alert and oriented to person, place, and time.  Psychiatric:        Mood and Affect: Mood normal.        Behavior: Behavior normal.         Judgment: Judgment normal.       BP 127/89 (BP Location: Right Arm, Patient Position: Sitting, Cuff Size: Small)   Pulse 100   Temp 98.8 F (37.1 C) (Oral)   Resp 16   Wt 153 lb (69.4 kg)   SpO2 100%   BMI 21.95  kg/m  Wt Readings from Last 3 Encounters:  02/02/22 153 lb (69.4 kg)  01/20/22 156 lb (70.8 kg)  01/17/22 155 lb (70.3 kg)       Assessment & Plan:   Problem List Items Addressed This Visit       Unprioritized   Skin rash - Primary    New.  Appears to be allergic type reaction.  Pt is advised as follows:  Apply hydrocortisone cream twice daily to affected areas. Start zyrtec 10mg  once daily.  Add hydroxyzine every 8 hours as needed for itching (may cause drowsiness so don't take before work or driving). Call if symptoms worsen or if not improved in 2-3 days.        Meds ordered this encounter  Medications   hydrocortisone 2.5 % cream    Sig: Apply topically 2 (two) times daily.    Dispense:  30 g    Refill:  1    Order Specific Question:   Supervising Provider    Answer:   A [[id number]]   hydrOXYzine (VISTARIL) 25 MG capsule    Sig: Take 1 capsule (25 mg total) by mouth every 8 (eight) hours as needed for itching.    Dispense:  30 capsule    Refill:  0    Order Specific Question:   Supervising Provider    Answer:   Danise Edge A [[id number]]    I, Danise Edge, NP, personally preformed the services described in this documentation.  All medical record entries made by the scribe were at my direction and in my presence.  I have reviewed the chart and discharge instructions (if applicable) and agree that the record reflects my personal performance and is accurate and complete. 02/02/2022   I,Amber Collins,acting as a scribe for 02/04/2022, NP.,have documented all relevant documentation on the behalf of Lemont Fillers, NP,as directed by  Lemont Fillers, NP while in the presence of Lemont Fillers, NP.  Lemont Fillers, NP

## 2022-02-02 NOTE — Patient Instructions (Addendum)
Apply hydrocortisone cream twice daily to affected areas. Start zyrtec 10mg  once daily.  Add hydroxyzine every 8 hours as needed for itching (may cause drowsiness so don't take before work or driving). Call if symptoms worsen or if not improved in 2-3 days.

## 2022-02-08 ENCOUNTER — Telehealth: Payer: Self-pay | Admitting: Family

## 2022-02-08 ENCOUNTER — Telehealth: Payer: Self-pay

## 2022-02-08 DIAGNOSIS — K625 Hemorrhage of anus and rectum: Secondary | ICD-10-CM

## 2022-02-11 ENCOUNTER — Other Ambulatory Visit (INDEPENDENT_AMBULATORY_CARE_PROVIDER_SITE_OTHER): Payer: 59

## 2022-02-11 DIAGNOSIS — K625 Hemorrhage of anus and rectum: Secondary | ICD-10-CM

## 2022-02-11 LAB — CBC WITH DIFFERENTIAL/PLATELET
Basophils Absolute: 0 10*3/uL (ref 0.0–0.1)
Basophils Relative: 0.8 % (ref 0.0–3.0)
Eosinophils Absolute: 0.2 10*3/uL (ref 0.0–0.7)
Eosinophils Relative: 4 % (ref 0.0–5.0)
HCT: 41.5 % (ref 39.0–52.0)
Hemoglobin: 14.4 g/dL (ref 13.0–17.0)
Lymphocytes Relative: 44.8 % (ref 12.0–46.0)
Lymphs Abs: 2.5 10*3/uL (ref 0.7–4.0)
MCHC: 34.6 g/dL (ref 30.0–36.0)
MCV: 90.9 fl (ref 78.0–100.0)
Monocytes Absolute: 0.4 10*3/uL (ref 0.1–1.0)
Monocytes Relative: 7.2 % (ref 3.0–12.0)
Neutro Abs: 2.4 10*3/uL (ref 1.4–7.7)
Neutrophils Relative %: 43.2 % (ref 43.0–77.0)
Platelets: 229 10*3/uL (ref 150.0–400.0)
RBC: 4.57 Mil/uL (ref 4.22–5.81)
RDW: 13.1 % (ref 11.5–15.5)
WBC: 5.7 10*3/uL (ref 4.0–10.5)

## 2022-02-22 ENCOUNTER — Encounter: Payer: Self-pay | Admitting: Physician Assistant

## 2022-02-22 NOTE — Telephone Encounter (Signed)
See mychart.  

## 2022-02-27 ENCOUNTER — Other Ambulatory Visit: Payer: Self-pay | Admitting: Family

## 2022-02-28 MED ORDER — ALPRAZOLAM 1 MG PO TABS
1.0000 mg | ORAL_TABLET | Freq: Every day | ORAL | 0 refills | Status: DC | PRN
  Filled 2022-02-28: qty 30, 30d supply, fill #0

## 2022-03-01 ENCOUNTER — Other Ambulatory Visit (HOSPITAL_BASED_OUTPATIENT_CLINIC_OR_DEPARTMENT_OTHER): Payer: Self-pay

## 2022-03-26 ENCOUNTER — Other Ambulatory Visit: Payer: Self-pay | Admitting: Family

## 2022-03-26 ENCOUNTER — Other Ambulatory Visit (HOSPITAL_BASED_OUTPATIENT_CLINIC_OR_DEPARTMENT_OTHER): Payer: Self-pay

## 2022-03-26 MED ORDER — PAROXETINE HCL 40 MG PO TABS
ORAL_TABLET | ORAL | 1 refills | Status: DC
  Filled 2022-03-26: qty 90, 90d supply, fill #0
  Filled 2022-06-22: qty 90, 90d supply, fill #1

## 2022-03-26 MED ORDER — PANTOPRAZOLE SODIUM 40 MG PO TBEC
DELAYED_RELEASE_TABLET | Freq: Every day | ORAL | 1 refills | Status: DC
  Filled 2022-03-26: qty 90, 90d supply, fill #0
  Filled 2022-06-22: qty 90, 90d supply, fill #1

## 2022-03-29 ENCOUNTER — Encounter: Payer: Self-pay | Admitting: Physician Assistant

## 2022-03-29 ENCOUNTER — Ambulatory Visit (INDEPENDENT_AMBULATORY_CARE_PROVIDER_SITE_OTHER): Payer: 59 | Admitting: Physician Assistant

## 2022-03-29 ENCOUNTER — Other Ambulatory Visit (HOSPITAL_BASED_OUTPATIENT_CLINIC_OR_DEPARTMENT_OTHER): Payer: Self-pay

## 2022-03-29 ENCOUNTER — Other Ambulatory Visit: Payer: Self-pay | Admitting: Family

## 2022-03-29 VITALS — BP 136/86 | HR 80 | Ht 70.0 in | Wt 155.0 lb

## 2022-03-29 DIAGNOSIS — R1319 Other dysphagia: Secondary | ICD-10-CM

## 2022-03-29 DIAGNOSIS — K602 Anal fissure, unspecified: Secondary | ICD-10-CM

## 2022-03-29 DIAGNOSIS — Z800 Family history of malignant neoplasm of digestive organs: Secondary | ICD-10-CM

## 2022-03-29 DIAGNOSIS — K625 Hemorrhage of anus and rectum: Secondary | ICD-10-CM | POA: Diagnosis not present

## 2022-03-29 DIAGNOSIS — K219 Gastro-esophageal reflux disease without esophagitis: Secondary | ICD-10-CM | POA: Diagnosis not present

## 2022-03-29 MED ORDER — ALPRAZOLAM 1 MG PO TABS
1.0000 mg | ORAL_TABLET | Freq: Every day | ORAL | 0 refills | Status: DC | PRN
  Filled 2022-03-29: qty 30, 30d supply, fill #0

## 2022-03-29 MED ORDER — NA SULFATE-K SULFATE-MG SULF 17.5-3.13-1.6 GM/177ML PO SOLN
1.0000 | Freq: Once | ORAL | 0 refills | Status: AC
  Filled 2022-03-29: qty 354, 1d supply, fill #0

## 2022-03-29 MED ORDER — AMBULATORY NON FORMULARY MEDICATION: 1 refills | Status: DC

## 2022-03-29 NOTE — Progress Notes (Signed)
Chief Complaint: Rectal bleeding  HPI:    Brandon Jensen is a 41 year old male, known to Dr. Lavon Paganini, who was referred to me by [name], NP for a complaint of rectal bleeding.      03/16/2016 patient seen in clinic by Dr. Lavon Paganini for rectal bleeding.  At that time discussed a family history of colon cancer with his maternal grandfather diagnosed at the age of 31.  He was scheduled for colonoscopy.  Also given rectal hydrocortisone cream 1%.    08/05/2017 patient seen in clinic for dysphagia.  At that time recommended EGD with dilation.  He declined to schedule for fear of cost.  He was continued on Pantoprazole 40 mg daily.    01/18/2022 patient had a CT of the abdomen and pelvis for right lower quadrant pain with findings consistent with acute appendicitis.  He underwent appendectomy.    Today, the patient presents to clinic and tells me that he has continued rectal bleeding really ever since being seen by Dr. Lavon Paganini back in 2017.  Typically this is just on the toilet paper but a couple of weeks ago he had 2 to 3 days where it seemed to also be in the toilet bowl.  Tells me as far as bowel movements he is not constipated but does have occasional episodes of diarrhea.  Describes that he has been on a weight loss diet over the past 2 years and has been able to lose a lot of weight but nothing has changed his rectal bleeding.  Again describes family history of colon cancer in his maternal grandfather.  Associated symptoms include some rectal throbbing/stabbing pain at times.    Along with above patient also continues with trouble with dysphagia.  Noting that he has just resolved to taking very small bites and chewing very well when eating but he is very conscious about what he is doing because he has had a few things get stuck on occasion.  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  He does not have any breakthrough reflux symptoms on this medicine.    Denies  fever, chills or weight loss.  Past Medical History:  Diagnosis Date   Allergy    Anxiety    GERD (gastroesophageal reflux disease)    Hyperlipemia    Obstructive sleep apnea 01/18/2011   Pneumonia    Sleep apnea     Past Surgical History:  Procedure Laterality Date   APPENDECTOMY     INGUINAL HERNIA REPAIR     LAPAROSCOPIC APPENDECTOMY N/A 01/18/2022   Procedure: APPENDECTOMY LAPAROSCOPIC;  Surgeon: Darnell Level, MD;  Location: [facility];  Service: General;  Laterality: N/A;    Current Outpatient Medications  Medication Sig Dispense Refill   ALPRAZolam (XANAX) 1 MG tablet Take 1 tablet (1 mg total) by mouth daily as needed for anxiety. 30 tablet 0   pantoprazole (PROTONIX) 40 MG tablet TAKE 1 TABLET (40 MG TOTAL) BY MOUTH DAILY. 90 tablet 1   PARoxetine (PAXIL) 40 MG tablet TAKE 1 TABLET (40 MG TOTAL) BY MOUTH EVERY MORNING. 90 tablet 1   acetaminophen (TYLENOL) 325 MG tablet Take 2 tablets (650 mg total) by mouth every 6 (six) hours as needed for mild pain or fever (or Fever >/= 101).     hydrocortisone 2.5 % cream Apply topically 2 (two) times daily. 30 g 1   hydrOXYzine (VISTARIL) 25 MG capsule Take 1 capsule (25 mg total) by mouth every 8 (eight) hours  as needed for itching. 30 capsule 0   ibuprofen (ADVIL) 600 MG tablet Take 1 tablet (600 mg total) by mouth every 6 (six) hours as needed for moderate pain. Take with food 30 tablet 0   tadalafil (CIALIS) 20 MG tablet TAKE 1/2-1 TABLET (10-20 MG TOTAL) BY MOUTH DAILY AS NEEDED FOR ERECTILE DYSFUNCTION. (Patient not taking: Reported on 01/18/2022) 20 tablet 3   No current facility-administered medications for this visit.    Allergies as of 03/29/2022 - Review Complete 03/29/2022  Allergen Reaction Noted   Beef-derived products  06/09/2011   Corn-containing products  06/09/2011   Eggs or egg-derived products  06/09/2011   Lipitor [atorvastatin] Other (See Comments) 01/22/2022   Milk-related compounds  06/09/2011    Peanut-containing drug products  06/09/2011   Red dye Hives 05/27/2011   Wheat  06/09/2011    Family History  Problem Relation Age of Onset   CAD Father 15       s/p cabg x 3   Anxiety disorder Sister    Kidney disease Maternal Grandmother    Colon cancer Maternal Grandfather 88   Bladder Cancer Maternal Grandfather    Skin cancer Maternal Grandfather    Stroke Maternal Grandfather    Hypertension Paternal Grandfather    Stroke Paternal Grandfather    Prostate cancer Paternal Grandfather    Heart disease Paternal Grandfather    Stomach cancer Neg Hx     Social History   Socioeconomic History   Marital status: Divorced    Spouse name: Sales promotion account executive   Number of children: 2   Years of education: Not on file   Highest education level: Not on file  Occupational History   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: [employer]   Occupation: Engineer, materials  Tobacco Use   Smoking status: Former    Types: Cigarettes    Quit date: 11/11/2013    Years since quitting: 8.3   Smokeless tobacco: Current    Types: Snuff  Vaping Use   Vaping Use: Never used  Substance and Sexual Activity   Alcohol use: No    Alcohol/week: 0.0 standard drinks of alcohol    Comment: 1-2 drinks a month   Drug use: No   Sexual activity: Yes    Partners: Female  Other Topics Concern   Not on file  Social History Narrative   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   2 children   Married      International aid/development worker of Corporate investment banker Strain: Not on file  Food Insecurity: Not on file  Transportation Needs: Not on file  Physical Activity: Not on file  Stress: Not on file  Social Connections: Not on file  Intimate Partner Violence: Not on file    Review of Systems:    Constitutional: No weight loss, fever or chills Skin: No rash  Cardiovascular: No chest pain  Respiratory: No SOB  Gastrointestinal: See HPI and otherwise  negative Genitourinary: No dysuria Neurological: No headache, dizziness or syncope Musculoskeletal: No new muscle or joint pain Hematologic: No bruising Psychiatric: No history of depression or anxiety   Physical Exam:  Vital signs: BP 136/86   Pulse 80   Ht 5\' 10"  (1.778 m)   Wt 155 lb (70.3 kg)   BMI 22.24 kg/m    Constitutional:   Pleasant Caucasian male appears to be in NAD, Well developed, Well nourished, alert and cooperative Head:  Normocephalic and atraumatic. Eyes:  PEERL, EOMI. No icterus. Conjunctiva pink. Ears:  Normal auditory acuity. Neck:  Supple Throat: Oral cavity and pharynx without inflammation, swelling or lesion.  Respiratory: Respirations even and unlabored. Lungs clear to auscultation bilaterally.   No wheezes, crackles, or rhonchi.  Cardiovascular: Normal S1, S2. No MRG. Regular rate and rhythm. No peripheral edema, cyanosis or pallor.  Gastrointestinal:  Soft, nondistended, nontender. No rebound or guarding. Normal bowel sounds. No appreciable masses or hepatomegaly. Rectal:  External: posterior anal fissure 1-2 cm from anal opening, ttp, hemorrhoid tags x2; Internal: no mass Msk:  Symmetrical without gross deformities. Without edema, no deformity or joint abnormality.  Neurologic:  Alert and  oriented x4;  grossly normal neurologically.  Skin:   Dry and intact without significant lesions or rashes. Psychiatric: Demonstrates good judgement and reason without abnormal affect or behaviors.  RELEVANT LABS AND IMAGING: CBC    Component Value Date/Time   WBC 5.7 02/11/2022 0733   RBC 4.57 02/11/2022 0733   HGB 14.4 02/11/2022 0733   HCT 41.5 02/11/2022 0733   PLT 229.0 02/11/2022 0733   MCV 90.9 02/11/2022 0733   MCH 32.0 01/17/2022 2120   MCHC 34.6 02/11/2022 0733   RDW 13.1 02/11/2022 0733   LYMPHSABS 2.5 02/11/2022 0733   MONOABS 0.4 02/11/2022 0733   EOSABS 0.2 02/11/2022 0733   BASOSABS 0.0 02/11/2022 0733    CMP     Component Value  Date/Time   NA 138 01/17/2022 2120   K 4.3 01/17/2022 2120   CL 105 01/17/2022 2120   CO2 24 01/17/2022 2120   GLUCOSE 131 (H) 01/17/2022 2120   BUN 12 01/17/2022 2120   CREATININE 0.87 01/17/2022 2120   CREATININE 1.10 09/27/2012 1035   CALCIUM 9.8 01/17/2022 2120   PROT 7.3 01/17/2022 2120   ALBUMIN 4.4 01/17/2022 2120   AST 32 01/17/2022 2120   ALT 78 (H) 01/17/2022 2120   ALKPHOS 63 01/17/2022 2120   BILITOT 1.5 (H) 01/17/2022 2120   GFRNONAA >60 01/17/2022 2120   GFRNONAA 89 09/27/2012 1035   GFRAA >89 09/27/2012 1035    Assessment: 1.  Rectal bleeding: This has been ongoing for years, now with some pain as well, likely related to below but the fissure was just above the anal opening, cannot rule out other causes and with family history of colon cancer colonoscopy is recommended 2.  Anal fissure: Seen at time of exam today, likely related to some of the above 3.  Dysphagia: Continues with occasional episodes of feeling like food gets stuck in his throat regardless of Pantoprazole; most likely stricture 4.  GERD: Controlled on Pantoprazole 40 mg daily  Plan: 1.  Scheduled patient for an EGD with likely dilation and diagnostic colonoscopy given ongoing rectal bleeding.  These were scheduled Dr. Silverio Decamp in the [facility].  Did provide the patient a detailed list of risks for the procedures and he agrees to proceed. Patient is appropriate for endoscopic procedure(s) in the ambulatory ([place]) setting.  2.  Prescribed Diltiazem ointment to be applied to fissure 3 times a day for 6 to 8 weeks. 3.  Recommend sits baths for 15 to 20 minutes 4.  Recommend over-the-counter RectiCare cream with lidocaine for pain relief 5.  Patient to continue Pantoprazole 40 mg daily for now 6.  Patient to follow in clinic per recommendations from Dr. Silverio Decamp after time of procedures.  Ellouise Newer, PA-C [facility] 03/29/2022, 9:29 AM  Cc: Debbrah Alar, NP

## 2022-03-29 NOTE — Patient Instructions (Addendum)
Continue Pantoprazole 40 mg daily 30-60 minutes before breakfast.   We have sent a prescription for Diltiazem 2% gel to [facility] for you. Using your index finger, you should apply a small amount of medication inside the rectum up to your first knuckle/joint three times daily x 6-8 weeks.  [facility]'s information is below: Address: [address] [place], [place] [address]  [phone]  *Please DO NOT go directly from our office to pick up this medication! Give the pharmacy 1 day to process the prescription as this is compounded and takes time to make.  You have been scheduled for an endoscopy and colonoscopy. Please follow the written instructions given to you at your visit today. Please pick up your prep supplies at the pharmacy within the next 1-3 days. If you use inhalers (even only as needed), please bring them with you on the day of your procedure.  _______________________________________________________  If you are age 41 or older, your body mass index should be between 23-30. Your Body mass index is 22.24 kg/m. If this is out of the aforementioned range listed, please consider follow up with your Primary Care Provider.  If you are age 78 or younger, your body mass index should be between 19-25. Your Body mass index is 22.24 kg/m. If this is out of the aformentioned range listed, please consider follow up with your Primary Care Provider.   ________________________________________________________  The [facility] providers would like to encourage you to use [facility] to communicate with providers for non-urgent requests or questions.  Due to long hold times on the telephone, sending your provider a message by [facility] may be a faster and more efficient way to get a response.  Please allow 48 business hours for a response.  Please remember that this is for non-urgent requests.  _______________________________________________________

## 2022-04-28 ENCOUNTER — Other Ambulatory Visit: Payer: Self-pay | Admitting: Family

## 2022-04-28 ENCOUNTER — Other Ambulatory Visit (HOSPITAL_BASED_OUTPATIENT_CLINIC_OR_DEPARTMENT_OTHER): Payer: Self-pay

## 2022-04-28 MED ORDER — ALPRAZOLAM 1 MG PO TABS
1.0000 mg | ORAL_TABLET | Freq: Every day | ORAL | 0 refills | Status: DC | PRN
  Filled 2022-04-28 – 2022-05-05 (×2): qty 30, 30d supply, fill #0

## 2022-04-28 NOTE — Telephone Encounter (Signed)
Requesting: alprazolam 1mg   Contract:06/20/2019 UDS:07/31/21 Last Visit: 02/02/22 Next Visit: None Last Refill:03/29/22 #30 and 0RF  Please Advise

## 2022-05-05 ENCOUNTER — Other Ambulatory Visit (HOSPITAL_BASED_OUTPATIENT_CLINIC_OR_DEPARTMENT_OTHER): Payer: Self-pay

## 2022-06-02 ENCOUNTER — Other Ambulatory Visit: Payer: Self-pay | Admitting: Family

## 2022-06-02 ENCOUNTER — Other Ambulatory Visit (HOSPITAL_BASED_OUTPATIENT_CLINIC_OR_DEPARTMENT_OTHER): Payer: Self-pay

## 2022-06-02 MED ORDER — ALPRAZOLAM 1 MG PO TABS
1.0000 mg | ORAL_TABLET | Freq: Every day | ORAL | 0 refills | Status: DC | PRN
  Filled 2022-06-02: qty 30, 30d supply, fill #0

## 2022-06-08 ENCOUNTER — Encounter (AMBULATORY_SURGERY_CENTER): Payer: 59 | Admitting: Gastroenterology

## 2022-06-08 ENCOUNTER — Encounter: Payer: Self-pay | Admitting: Gastroenterology

## 2022-06-08 VITALS — BP 106/67 | HR 67 | Temp 97.8°F | Resp 13 | Ht 70.0 in | Wt 155.0 lb

## 2022-06-08 DIAGNOSIS — K649 Unspecified hemorrhoids: Secondary | ICD-10-CM | POA: Diagnosis not present

## 2022-06-08 DIAGNOSIS — K602 Anal fissure, unspecified: Secondary | ICD-10-CM

## 2022-06-08 DIAGNOSIS — K5730 Diverticulosis of large intestine without perforation or abscess without bleeding: Secondary | ICD-10-CM

## 2022-06-08 DIAGNOSIS — K219 Gastro-esophageal reflux disease without esophagitis: Secondary | ICD-10-CM | POA: Diagnosis not present

## 2022-06-08 DIAGNOSIS — K625 Hemorrhage of anus and rectum: Secondary | ICD-10-CM

## 2022-06-08 DIAGNOSIS — R1319 Other dysphagia: Secondary | ICD-10-CM

## 2022-06-08 DIAGNOSIS — K922 Gastrointestinal hemorrhage, unspecified: Secondary | ICD-10-CM

## 2022-06-08 MED ORDER — SODIUM CHLORIDE 0.9 % IV SOLN: 500.0000 mL | Freq: Once | INTRAVENOUS | Status: DC

## 2022-06-08 NOTE — Progress Notes (Signed)
Pt's states no medical or surgical changes since previsit or office visit. 

## 2022-06-08 NOTE — Patient Instructions (Signed)
HANDOUTS PROVIDED ON: ESOPHAGITIS, DIVERTICULOSIS, & HEMORRHOIDS  The polyps removed/biopsies taken today have been sent for pathology.  The results can take 1-3 weeks to receive.  When your next colonoscopy should occur will be based on the pathology results.    You may resume your previous diet and medication schedule.  Thank you for allowing [place] to care for you today!!!   YOU HAD AN ENDOSCOPIC PROCEDURE TODAY AT [facility]:   Refer to the procedure report that was given to you for any specific questions about what was found during the examination.  If the procedure report does not answer your questions, please call your gastroenterologist to clarify.  If you requested that your care partner not be given the details of your procedure findings, then the procedure report has been included in a sealed envelope for you to review at your convenience later.  YOU SHOULD EXPECT: Some feelings of bloating in the abdomen. Passage of more gas than usual.  Walking can help get rid of the air that was put into your GI tract during the procedure and reduce the bloating. If you had a lower endoscopy (such as a colonoscopy or flexible sigmoidoscopy) you may notice spotting of blood in your stool or on the toilet paper. If you underwent a bowel prep for your procedure, you may not have a normal bowel movement for a few days.  Please Note:  You might notice some irritation and congestion in your nose or some drainage.  This is from the oxygen used during your procedure.  There is no need for concern and it should clear up in a day or so.  SYMPTOMS TO REPORT IMMEDIATELY:  Following lower endoscopy (colonoscopy or flexible sigmoidoscopy):  Excessive amounts of blood in the stool  Significant tenderness or worsening of abdominal pains  Swelling of the abdomen that is new, acute  Fever of 100F or higher  Following upper endoscopy (EGD)  Vomiting of blood or coffee ground material  New chest  pain or pain under the shoulder blades  Painful or persistently difficult swallowing  New shortness of breath  Fever of 100F or higher  Black, tarry-looking stools  For urgent or emergent issues, a gastroenterologist can be reached at any hour by calling [phone]. Do not use MyChart messaging for urgent concerns.    DIET:  We do recommend a small meal at first, but then you may proceed to your regular diet.  Drink plenty of fluids but you should avoid alcoholic beverages for 24 hours.  ACTIVITY:  You should plan to take it easy for the rest of today and you should NOT DRIVE or use heavy machinery until tomorrow (because of the sedation medicines used during the test).    FOLLOW UP: Our staff will call the number listed on your records the next business day following your procedure.  We will call around 7:15- 8:00 am to check on you and address any questions or concerns that you may have regarding the information given to you following your procedure. If we do not reach you, we will leave a message.     If any biopsies were taken you will be contacted by phone or by letter within the next 1-3 weeks.  Please call us at [phone] if you have not heard about the biopsies in 3 weeks.    SIGNATURES/CONFIDENTIALITY: You and/or your care partner have signed paperwork which will be entered into your electronic medical record.  These signatures attest  to the fact that that the information above on your After Visit Summary has been reviewed and is understood.  Full responsibility of the confidentiality of this discharge information lies with you and/or your care-partner.

## 2022-06-08 NOTE — Progress Notes (Unsigned)
[facility] History and Physical   Primary Care Physician:  Debbrah Alar, NP   Reason for Procedure:  Dysphagia, GERD, Rectal bleeding  Plan:    EGD and colonoscopy with possible interventions as needed     HPI: Brandon Jensen is a very pleasant 41 y.o. male here for EGD and colonoscopy for evaluation of rectal bleeding, dysphagia and GERD.   The risks and benefits as well as alternatives of endoscopic procedure(s) have been discussed and reviewed. All questions answered. The patient agrees to proceed.    Past Medical History:  Diagnosis Date   Allergy    Anxiety    GERD (gastroesophageal reflux disease)    Hyperlipemia    Obstructive sleep apnea 01/18/2011   Pneumonia    Sleep apnea     Past Surgical History:  Procedure Laterality Date   APPENDECTOMY     INGUINAL HERNIA REPAIR     LAPAROSCOPIC APPENDECTOMY N/A 01/18/2022   Procedure: APPENDECTOMY LAPAROSCOPIC;  Surgeon: Armandina Gemma, MD;  Location: [facility];  Service: General;  Laterality: N/A;    Prior to Admission medications   Medication Sig Start Date End Date Taking? Authorizing Provider  ALPRAZolam Duanne Moron) 1 MG tablet Take 1 tablet (1 mg total) by mouth daily as needed for anxiety. 06/02/22  Yes Debbrah Alar, NP  AMBULATORY NON FORMULARY MEDICATION Medication Name: Diltiazem 2% gel - Using your index finger, apply a small amount of medication inside the rectum up to your first knuckle/joint three times daily x 6-8 weeks. 03/29/22  Yes Levin Erp, PA  pantoprazole (PROTONIX) 40 MG tablet TAKE 1 TABLET (40 MG TOTAL) BY MOUTH DAILY. 03/26/22 03/26/23 Yes Debbrah Alar, NP  PARoxetine (PAXIL) 40 MG tablet TAKE 1 TABLET (40 MG TOTAL) BY MOUTH EVERY MORNING. 03/26/22 03/26/23 Yes Debbrah Alar, NP  acetaminophen (TYLENOL) 325 MG tablet Take 2 tablets (650 mg total) by mouth every 6 (six) hours as needed for mild pain or fever (or Fever >/= 101). 01/19/22   Norm Parcel,  PA-C  hydrocortisone 2.5 % cream Apply topically 2 (two) times daily. 02/02/22   Debbrah Alar, NP  hydrOXYzine (VISTARIL) 25 MG capsule Take 1 capsule (25 mg total) by mouth every 8 (eight) hours as needed for itching. 02/02/22   Debbrah Alar, NP  ibuprofen (ADVIL) 600 MG tablet Take 1 tablet (600 mg total) by mouth every 6 (six) hours as needed for moderate pain. Take with food 01/19/22   Norm Parcel, PA-C  tadalafil (CIALIS) 20 MG tablet TAKE 1/2-1 TABLET (10-20 MG TOTAL) BY MOUTH DAILY AS NEEDED FOR ERECTILE DYSFUNCTION. Patient not taking: Reported on 01/18/2022 07/31/21 07/31/22  Debbrah Alar, NP    Current Outpatient Medications  Medication Sig Dispense Refill   ALPRAZolam (XANAX) 1 MG tablet Take 1 tablet (1 mg total) by mouth daily as needed for anxiety. 30 tablet 0   AMBULATORY NON FORMULARY MEDICATION Medication Name: Diltiazem 2% gel - Using your index finger, apply a small amount of medication inside the rectum up to your first knuckle/joint three times daily x 6-8 weeks. 30 g 1   pantoprazole (PROTONIX) 40 MG tablet TAKE 1 TABLET (40 MG TOTAL) BY MOUTH DAILY. 90 tablet 1   PARoxetine (PAXIL) 40 MG tablet TAKE 1 TABLET (40 MG TOTAL) BY MOUTH EVERY MORNING. 90 tablet 1   acetaminophen (TYLENOL) 325 MG tablet Take 2 tablets (650 mg total) by mouth every 6 (six) hours as needed for mild pain or fever (or Fever >/= 101).  hydrocortisone 2.5 % cream Apply topically 2 (two) times daily. 30 g 1   hydrOXYzine (VISTARIL) 25 MG capsule Take 1 capsule (25 mg total) by mouth every 8 (eight) hours as needed for itching. 30 capsule 0   ibuprofen (ADVIL) 600 MG tablet Take 1 tablet (600 mg total) by mouth every 6 (six) hours as needed for moderate pain. Take with food 30 tablet 0   tadalafil (CIALIS) 20 MG tablet TAKE 1/2-1 TABLET (10-20 MG TOTAL) BY MOUTH DAILY AS NEEDED FOR ERECTILE DYSFUNCTION. (Patient not taking: Reported on 01/18/2022) 20 tablet 3   Current  Facility-Administered Medications  Medication Dose Route Frequency Provider Last Rate Last Admin   0.9 %  sodium chloride infusion  500 mL Intravenous Once Mauri Pole, MD        Allergies as of 06/08/2022 - Review Complete 06/08/2022  Allergen Reaction Noted   Beef-derived products  06/09/2011   Corn-containing products  06/09/2011   Eggs or egg-derived products  06/09/2011   Lipitor [atorvastatin] Other (See Comments) 01/22/2022   Milk-related compounds  06/09/2011   Peanut-containing drug products  06/09/2011   Red dye Hives 05/27/2011   Wheat  06/09/2011    Family History  Problem Relation Age of Onset   CAD Father 41       s/p cabg x 3   Anxiety disorder Sister    Kidney disease Maternal Grandmother    Colon cancer Maternal Grandfather 88   Bladder Cancer Maternal Grandfather    Skin cancer Maternal Grandfather    Stroke Maternal Grandfather    Hypertension Paternal Grandfather    Stroke Paternal Grandfather    Prostate cancer Paternal Grandfather    Heart disease Paternal Grandfather    Stomach cancer Neg Hx     Social History   Socioeconomic History   Marital status: Divorced    Spouse name: Actor   Number of children: 2   Years of education: Not on file   Highest education level: Not on file  Occupational History   Occupation: DELI Armed forces operational officer: [employer]   Occupation: Animal nutritionist  Tobacco Use   Smoking status: Former    Types: Cigarettes    Quit date: 11/11/2013    Years since quitting: 8.5   Smokeless tobacco: Current    Types: Snuff  Vaping Use   Vaping Use: Never used  Substance and Sexual Activity   Alcohol use: No    Alcohol/week: 0.0 standard drinks of alcohol    Comment: 1-2 drinks a month   Drug use: No   Sexual activity: Yes    Partners: Female  Other Topics Concern   Not on file  Social History Narrative   Works at [employer], 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  Physical Exam: Vital signs in last 24 hours: Blood Pressure 114/78   Pulse 81   Temperature 97.8 F (36.6 C)   Height 5\' 10"  (1.778 m)   Weight 155 lb (70.3 kg)   Oxygen Saturation 100%  Body Mass Index 22.24 kg/m  General:   Alert, NAD Lungs:  Clear .   Heart:  Regular rate and rhythm Abdomen:  Soft, nontender and nondistended. Neuro/Psych:  Alert and cooperative. Normal mood and affect. A and O x 3  Reviewed labs, radiology imaging, old records and pertinent past GI work up  Patient is appropriate for planned procedure(s) and anesthesia in an ambulatory setting   K. Denzil Magnuson , MD [phone]

## 2022-06-08 NOTE — Progress Notes (Unsigned)
Called to room to assist during endoscopic procedure.  Patient ID and intended procedure confirmed with present staff. Received instructions for my participation in the procedure from the performing physician.  

## 2022-06-08 NOTE — Op Note (Signed)
[facility] Patient Name: Brandon Jensen Procedure Date: 06/08/2022 11:06 AM MRN: [id number] Endoscopist: Napoleon Form , MD Age: 41 Referring MD:  Date of Birth: 02/12/1981 Gender: Male Account #: [id number] Procedure:                Upper GI endoscopy Indications:              Dysphagia, Esophageal reflux symptoms that persist                            despite appropriate therapy Medicines:                Monitored Anesthesia Care Procedure:                Pre-Anesthesia Assessment:                           - Prior to the procedure, a History and Physical                            was performed, and patient medications and                            allergies were reviewed. The patient's tolerance of                            previous anesthesia was also reviewed. The risks                            and benefits of the procedure and the sedation                            options and risks were discussed with the patient.                            All questions were answered, and informed consent                            was obtained. Prior Anticoagulants: The patient has                            taken no previous anticoagulant or antiplatelet                            agents. ASA Grade Assessment: II - A patient with                            mild systemic disease. After reviewing the risks                            and benefits, the patient was deemed in                            satisfactory condition to undergo the procedure.  After obtaining informed consent, the endoscope was                            passed under direct vision. Throughout the                            procedure, the patient's blood pressure, pulse, and                            oxygen saturations were monitored continuously. The                            GIF HQ190 #[id number] was introduced through the                            mouth, and advanced to  the second part of duodenum.                            The upper GI endoscopy was accomplished without                            difficulty. The patient tolerated the procedure                            well. Scope In: Scope Out: Findings:                 LA Grade A (one or more mucosal breaks less than 5                            mm, not extending between tops of 2 mucosal folds)                            esophagitis was found 38 to 40 cm from the incisors.                           Spontaneous mucosal sclerosis was found in the                            distal esophagus. Biopsies were taken with a cold                            forceps for histology.                           The gastroesophageal flap valve was visualized                            endoscopically and classified as Hill Grade II                            (fold present, opens with respiration).  The stomach was normal.                           The cardia and gastric fundus were normal on                            retroflexion.                           The examined duodenum was normal. Complications:            No immediate complications. Estimated Blood Loss:     Estimated blood loss was minimal. Impression:               - LA Grade A reflux esophagitis.                           - Esophageal mucosal sclerosis. Biopsied.                           - Gastroesophageal flap valve classified as Hill                            Grade II (fold present, opens with respiration).                           - Normal stomach.                           - Normal examined duodenum. Recommendation:           - Resume previous diet.                           - Continue present medications.                           - Await pathology results. Napoleon Form, MD 06/08/2022 12:04:18 PM This report has been signed electronically.

## 2022-06-08 NOTE — Op Note (Signed)
[facility] Patient Name: Brandon Jensen Procedure Date: 06/08/2022 11:05 AM MRN: [id number] Endoscopist: Napoleon Form , MD Age: 41 Referring MD:  Date of Birth: 1980/12/22 Gender: Male Account #: [id number] Procedure:                Colonoscopy Indications:              Evaluation of unexplained GI bleeding presenting                            with Hematochezia Medicines:                Monitored Anesthesia Care Procedure:                Pre-Anesthesia Assessment:                           - Prior to the procedure, a History and Physical                            was performed, and patient medications and                            allergies were reviewed. The patient's tolerance of                            previous anesthesia was also reviewed. The risks                            and benefits of the procedure and the sedation                            options and risks were discussed with the patient.                            All questions were answered, and informed consent                            was obtained. Prior Anticoagulants: The patient has                            taken no previous anticoagulant or antiplatelet                            agents. ASA Grade Assessment: II - A patient with                            mild systemic disease. After reviewing the risks                            and benefits, the patient was deemed in                            satisfactory condition to undergo the procedure.  After obtaining informed consent, the colonoscope                            was passed under direct vision. Throughout the                            procedure, the patient's blood pressure, pulse, and                            oxygen saturations were monitored continuously. The                            [id number]-HQ190L Colonoscope was introduced through the                            anus and advanced to the the cecum,  identified by                            appendiceal orifice and ileocecal valve. The                            colonoscopy was performed without difficulty. The                            patient tolerated the procedure well. The quality                            of the bowel preparation was good. The ileocecal                            valve, appendiceal orifice, and rectum were                            photographed. Scope In: 11:25:59 AM Scope Out: 11:48:08 AM Scope Withdrawal Time: 0 hours 9 minutes 58 seconds  Total Procedure Duration: 0 hours 22 minutes 9 seconds  Findings:                 The perianal and digital rectal examinations were                            normal.                           A few small-mouthed diverticula were found in the                            sigmoid colon.                           Non-bleeding external and internal hemorrhoids were                            found during retroflexion. The hemorrhoids were  small.                           A less than 5 mm anal fissure was found in the anal                            canal, appears to be healing. Complications:            No immediate complications. Estimated Blood Loss:     Estimated blood loss was minimal. Impression:               - Diverticulosis in the sigmoid colon.                           - Non-bleeding external and internal hemorrhoids.                           - Anal fissure.                           - No specimens collected. Recommendation:           - Patient has a contact number available for                            emergencies. The signs and symptoms of potential                            delayed complications were discussed with the                            patient. Return to normal activities tomorrow.                            Written discharge instructions were provided to the                            patient.                            - Resume previous diet.                           - Continue present medications.                           - Repeat colonoscopy in 10 years for surveillance. Napoleon Form, MD 06/08/2022 12:00:49 PM This report has been signed electronically.

## 2022-06-08 NOTE — Progress Notes (Unsigned)
Report to pacu rn. Vss. Care resumed by rn. 

## 2022-06-09 ENCOUNTER — Telehealth: Payer: Self-pay

## 2022-06-09 NOTE — Telephone Encounter (Signed)
  Follow up Call-     06/08/2022   10:31 AM  Call back number  Post procedure Call Back phone  # [phone]  Permission to leave phone message Yes     Patient questions:  Do you have a fever, pain , or abdominal swelling? No. Pain Score  0 *  Have you tolerated food without any problems? Yes.    Have you been able to return to your normal activities? Yes.    Do you have any questions about your discharge instructions: Diet   No. Medications  No. Follow up visit  No.  Do you have questions or concerns about your Care? No.  Actions: * If pain score is 4 or above: No action needed, pain <4.

## 2022-06-18 ENCOUNTER — Other Ambulatory Visit: Payer: Self-pay | Admitting: Family

## 2022-06-18 ENCOUNTER — Other Ambulatory Visit (HOSPITAL_BASED_OUTPATIENT_CLINIC_OR_DEPARTMENT_OTHER): Payer: Self-pay

## 2022-06-18 MED ORDER — ALPRAZOLAM 1 MG PO TABS
1.0000 mg | ORAL_TABLET | Freq: Every day | ORAL | 0 refills | Status: DC | PRN
  Filled 2022-06-18 – 2022-07-02 (×3): qty 30, 30d supply, fill #0

## 2022-06-20 ENCOUNTER — Encounter: Payer: Self-pay | Admitting: Gastroenterology

## 2022-06-22 ENCOUNTER — Other Ambulatory Visit (HOSPITAL_BASED_OUTPATIENT_CLINIC_OR_DEPARTMENT_OTHER): Payer: Self-pay

## 2022-06-29 ENCOUNTER — Encounter: Payer: Self-pay | Admitting: Gastroenterology

## 2022-06-29 ENCOUNTER — Ambulatory Visit (INDEPENDENT_AMBULATORY_CARE_PROVIDER_SITE_OTHER): Payer: 59 | Admitting: Gastroenterology

## 2022-06-29 VITALS — BP 122/82 | HR 95 | Ht 70.0 in | Wt 154.8 lb

## 2022-06-29 DIAGNOSIS — K602 Anal fissure, unspecified: Secondary | ICD-10-CM

## 2022-06-29 DIAGNOSIS — K649 Unspecified hemorrhoids: Secondary | ICD-10-CM

## 2022-06-29 HISTORY — DX: Anal fissure, unspecified: K60.2

## 2022-06-29 MED ORDER — AMBULATORY NON FORMULARY MEDICATION
1 refills | Status: DC
Start: 2022-06-29 — End: 2024-03-23

## 2022-06-29 NOTE — Patient Instructions (Addendum)
We have sent a prescription for nitroglycerin 0.125% gel with Lidocaine 5% to [facility]. You should apply a pea size amount up to the first knuckle in your rectum three times daily x 6-8 weeks.  [facility]'s information is below: Address: [address], [place], [place] [address]  [phone]  *Please DO NOT go directly from our office to pick up this medication! Give the pharmacy 1 day to process the prescription as this is compounded and takes time to make.  Keep perianal area dry.   Destin extra strength a few times per day.   Call or send Mychart message in 4-6 weeks with an update.   _______________________________________________________  If you are age 3 or older, your body mass index should be between 23-30. Your Body mass index is 22.21 kg/m. If this is out of the aforementioned range listed, please consider follow up with your Primary Care Provider.  If you are age 17 or younger, your body mass index should be between 19-25. Your Body mass index is 22.21 kg/m. If this is out of the aformentioned range listed, please consider follow up with your Primary Care Provider.   ________________________________________________________  The  GI providers would like to encourage you to use [facility] to communicate with providers for non-urgent requests or questions.  Due to long hold times on the telephone, sending your provider a message by [facility] may be a faster and more efficient way to get a response.  Please allow 48 business hours for a response.  Please remember that this is for non-urgent requests.  _______________________________________________________

## 2022-06-29 NOTE — Progress Notes (Signed)
06/29/2022 Brandon Jensen [id number] 1981/06/05   HISTORY OF PRESENT ILLNESS: This is a 41 year old male who is a patient of Dr. Woodward Ku.  He was seen here in August and examined, treated for fissure by one of the other PAs.  Then underwent colonoscopy just about 20 days ago that showed hemorrhoids both internal and external as well as the fissure.  He has been using the diltiazem consistently, but has not really noticed improvement, in fact he says recently it seems worse with burning when he applies the ointment.  Having bright red blood when he wipes.  Complaining of a lot of perianal itching.  Colonoscopy 05/2022:  - Diverticulosis in the sigmoid colon. - Non-bleeding external and internal hemorrhoids. - Anal fissure. - No specimens collected.   Past Medical History:  Diagnosis Date   Allergy    Anxiety    GERD (gastroesophageal reflux disease)    Hyperlipemia    Obstructive sleep apnea 01/18/2011   Pneumonia    Sleep apnea    Past Surgical History:  Procedure Laterality Date   APPENDECTOMY     COLONOSCOPY     INGUINAL HERNIA REPAIR     LAPAROSCOPIC APPENDECTOMY N/A 01/18/2022   Procedure: APPENDECTOMY LAPAROSCOPIC;  Surgeon: Armandina Gemma, MD;  Location: [facility];  Service: General;  Laterality: N/A;    reports that he quit smoking about 8 years ago. His smoking use included cigarettes. His smokeless tobacco use includes snuff. He reports that he does not drink alcohol and does not use drugs. family history includes Anxiety disorder in his sister; Bladder Cancer in his maternal grandfather; CAD (age of onset: 1) in his father; Colon cancer (age of onset: 28) in his maternal grandfather; Heart disease in his paternal grandfather; Hypertension in his paternal grandfather; Kidney disease in his maternal grandmother; Prostate cancer in his paternal grandfather; Skin cancer in his maternal grandfather; Stroke in his maternal grandfather and paternal grandfather. Allergies   Allergen Reactions   Beef-Derived Products     Confirmed through allergy testing   Corn-Containing Products     Confirmed through allergy testing   Eggs Or Egg-Derived Products     Confirmed through allergy testing   Lipitor [Atorvastatin] Other (See Comments)    rhabdomyolysis   Milk-Related Compounds     Confirmed through allergy testing   Peanut-Containing Drug Products     Confirmed through allergy testing   Red Dye Hives    Patient reported not having a reaction, so not sure   Wheat     Confirmed through allergy testing      Outpatient Encounter Medications as of 06/29/2022  Medication Sig   ALPRAZolam (XANAX) 1 MG tablet Take 1 tablet (1 mg total) by mouth daily as needed for anxiety.   pantoprazole (PROTONIX) 40 MG tablet TAKE 1 TABLET (40 MG TOTAL) BY MOUTH DAILY.   PARoxetine (PAXIL) 40 MG tablet TAKE 1 TABLET (40 MG TOTAL) BY MOUTH EVERY MORNING.   AMBULATORY NON FORMULARY MEDICATION Medication Name: Diltiazem 2% gel - Using your index finger, apply a small amount of medication inside the rectum up to your first knuckle/joint three times daily x 6-8 weeks. (Patient not taking: Reported on 06/29/2022)   Facility-Administered Encounter Medications as of 06/29/2022  Medication   0.9 %  sodium chloride infusion     REVIEW OF SYSTEMS  : All other systems reviewed and negative except where noted in the History of Present Illness.   PHYSICAL EXAM: BP 122/82   Pulse  95   Ht 5\' 10"  (1.778 m)   Wt 154 lb 12.8 oz (70.2 kg)   SpO2 98%   BMI 22.21 kg/m  General: Well developed white male in no acute distress   ASSESSMENT AND PLAN: *41 year old male with complaints of hemorrhoids and anal fissure causing rectal bleeding, itching, and just overall perianal irritation.  He has used the diltiazem consistently, but still having issues.  We will switch to the nitroglycerin 0.125% mixed with 5% lidocaine to be used 3 times daily over the next 6 to 10 weeks.  Prescription sent  to pharmacy.  Can also use Desitin to the perianal area.  We also talked about avoiding overaggressive hygiene, not causing friction to the area with towels, etc.  Needs to make sure the area is dried (can use blow dryer on cool setting) to avoid further irritation and skin breakdown.  We will send a MyChart message in about 4 to 6 weeks with an update.  CC:  46, NP

## 2022-07-02 ENCOUNTER — Other Ambulatory Visit (HOSPITAL_BASED_OUTPATIENT_CLINIC_OR_DEPARTMENT_OTHER): Payer: Self-pay

## 2022-07-22 ENCOUNTER — Other Ambulatory Visit (HOSPITAL_COMMUNITY): Payer: Self-pay

## 2022-07-22 ENCOUNTER — Other Ambulatory Visit: Payer: Self-pay | Admitting: Family

## 2022-07-22 MED ORDER — ALPRAZOLAM 1 MG PO TABS
1.0000 mg | ORAL_TABLET | Freq: Every day | ORAL | 0 refills | Status: DC | PRN
  Filled 2022-07-22 – 2022-07-30 (×2): qty 30, 30d supply, fill #0

## 2022-07-22 NOTE — Telephone Encounter (Signed)
Requesting: alprazolam 1mg   Contract:06/20/2019 UDS: 07/31/21 Last Visit: 02/02/22 Next Visit: None Last Refill: 06/18/22 #30 and 0RF   Please Advise

## 2022-07-30 ENCOUNTER — Other Ambulatory Visit: Payer: Self-pay

## 2022-07-30 ENCOUNTER — Other Ambulatory Visit (HOSPITAL_COMMUNITY): Payer: Self-pay

## 2022-08-23 ENCOUNTER — Other Ambulatory Visit: Payer: Self-pay | Admitting: Family

## 2022-08-23 ENCOUNTER — Other Ambulatory Visit (HOSPITAL_COMMUNITY): Payer: Self-pay

## 2022-08-23 MED ORDER — ALPRAZOLAM 1 MG PO TABS
1.0000 mg | ORAL_TABLET | Freq: Every day | ORAL | 0 refills | Status: DC | PRN
  Filled 2022-08-23: qty 30, 30d supply, fill #0

## 2022-08-31 ENCOUNTER — Ambulatory Visit (INDEPENDENT_AMBULATORY_CARE_PROVIDER_SITE_OTHER): Payer: 59 | Admitting: Family

## 2022-08-31 ENCOUNTER — Other Ambulatory Visit (HOSPITAL_BASED_OUTPATIENT_CLINIC_OR_DEPARTMENT_OTHER): Payer: Self-pay

## 2022-08-31 ENCOUNTER — Encounter: Payer: Self-pay | Admitting: Family

## 2022-08-31 VITALS — BP 130/86 | HR 73 | Temp 97.8°F | Resp 16 | Ht 70.0 in | Wt 158.0 lb

## 2022-08-31 DIAGNOSIS — F419 Anxiety disorder, unspecified: Secondary | ICD-10-CM

## 2022-08-31 DIAGNOSIS — Z23 Encounter for immunization: Secondary | ICD-10-CM | POA: Diagnosis not present

## 2022-08-31 DIAGNOSIS — Z720 Tobacco use: Secondary | ICD-10-CM

## 2022-08-31 DIAGNOSIS — Z0000 Encounter for general adult medical examination without abnormal findings: Secondary | ICD-10-CM | POA: Diagnosis not present

## 2022-08-31 DIAGNOSIS — K602 Anal fissure, unspecified: Secondary | ICD-10-CM

## 2022-08-31 DIAGNOSIS — R7989 Other specified abnormal findings of blood chemistry: Secondary | ICD-10-CM

## 2022-08-31 DIAGNOSIS — G4733 Obstructive sleep apnea (adult) (pediatric): Secondary | ICD-10-CM | POA: Diagnosis not present

## 2022-08-31 DIAGNOSIS — K219 Gastro-esophageal reflux disease without esophagitis: Secondary | ICD-10-CM | POA: Diagnosis not present

## 2022-08-31 DIAGNOSIS — E782 Mixed hyperlipidemia: Secondary | ICD-10-CM

## 2022-08-31 LAB — COMPREHENSIVE METABOLIC PANEL
ALT: 25 U/L (ref 0–53)
AST: 19 U/L (ref 0–37)
Albumin: 4.6 g/dL (ref 3.5–5.2)
Alkaline Phosphatase: 60 U/L (ref 39–117)
BUN: 15 mg/dL (ref 6–23)
CO2: 24 mEq/L (ref 19–32)
Calcium: 9.3 mg/dL (ref 8.4–10.5)
Chloride: 105 mEq/L (ref 96–112)
Creatinine, Ser: 0.85 mg/dL (ref 0.40–1.50)
GFR: 107.74 mL/min (ref >60.00)
Glucose, Bld: 90 mg/dL (ref 70–99)
Potassium: 3.8 mEq/L (ref 3.5–5.1)
Sodium: 138 mEq/L (ref 135–145)
Total Bilirubin: 0.6 mg/dL (ref 0.2–1.2)
Total Protein: 6.6 g/dL (ref 6.0–8.3)

## 2022-08-31 LAB — LIPID PANEL
Cholesterol: 222 mg/dL — ABNORMAL HIGH (ref 0–200)
HDL: 52.3 mg/dL (ref >39.00)
LDL Cholesterol: 153 mg/dL — ABNORMAL HIGH (ref 0–99)
NonHDL: 169.74
Total CHOL/HDL Ratio: 4
Triglycerides: 83 mg/dL (ref 0.0–149.0)
VLDL: 16.6 mg/dL (ref 0.0–40.0)

## 2022-08-31 MED ORDER — ALPRAZOLAM 1 MG PO TABS
1.0000 mg | ORAL_TABLET | Freq: Every day | ORAL | 0 refills | Status: DC | PRN
  Filled 2022-08-31: qty 30, 30d supply, fill #0

## 2022-08-31 NOTE — Assessment & Plan Note (Signed)
Clinically resolved. Advised him to call GI if he has recurrent pain.

## 2022-08-31 NOTE — Assessment & Plan Note (Signed)
>>  ASSESSMENT AND PLAN FOR ROUTINE GENERAL MEDICAL EXAMINATION AT A HEALTH CARE FACILITY WRITTEN ON 08/31/2022  7:59 AM BY O'SULLIVAN, Jarris Kortz, NP  Continue healthy diet and regular exercise.  Td today. Declines flu shot/covid shot.

## 2022-08-31 NOTE — Assessment & Plan Note (Signed)
Encouraged nicotine pouch cessation.

## 2022-08-31 NOTE — Progress Notes (Signed)
Subjective:   By signing my name below, I, Pura Spice, attest that this documentation has been prepared under the direction and in the presence of Lemont Fillers, NP 08/31/22   Patient ID: Brandon Jensen, male    DOB: 1980/12/01, 42 y.o.   MRN: [id number]  Chief Complaint  Patient presents with   Annual Exam    HPI Patient is in today for comprehensive physical exam.  Sleep apnea: He has not had a repeat sleep study since losing weight. He reports losing ~50lbs. His last sleep study was in 2019. He has been told that he no longer snores. He feels well rested in the mornings as long as he gets at least 7 hours of sleep. Patient states that he does continue to experience sleep paralysis at times. He is agreeable to seeing a sleep medicine provider.  Elevated blood pressure readings: His blood pressures are well controlled without any blood pressure medications at this time. BP Readings from Last 5 Encounters:  08/31/22 130/86  06/29/22 122/82  06/08/22 106/67  03/29/22 136/86  02/02/22 127/89   Hyperlipidemia: Statin therapy was stopped in May 2023 due to occurrence of rhabdomyolysis.  Lab Results  Component Value Date   CHOL 121 07/31/2021   HDL 48.10 07/31/2021   LDLCALC 59 07/31/2021   LDLDIRECT 67.0 02/01/2018   TRIG 69.0 07/31/2021   CHOLHDL 3 07/31/2021   Anxiety: Patient is currently using Xanax 1mg  prn and Paxil 40mg  daily. However, he requested a refill for his Xanax and he has not yet received this via mail order. He states that his anxiety has been well controlled.  GERD: He reports symptoms have been well controlled with Protonix 40mg  daily.  Health maintenance: Colonoscopy from 06/08/2022 revealed diverticulosis in the sigmoid colon, non-bleeding external and internal hemorrhoids, and an anal fissure. Endoscopy from 06/08/22 revealed LA Grade A esophagitis was found 38 to 40cm from the incisors; spontaneous mucosal sclerosis was found in the distal  esophagus- biopsies collected and suggestive of reflux suggestive/negative for malignancy; gastroesophageal flap valve was visualized and classified as Hill Grade II. He denies any recent changes in surgical or family history since last visit.  Anal fissure: He reports using all of the compound provided by GI a few days ago. He denies any pain and wonders how to know if the fissure has resolved.  Tobacco use: He states that he is only using oral nicotine patches. He does not smoke tobacco products.  Alcohol use: He reports rare alcohol use.  Immunizations: Due for tdap booster. He is agreeable to getting this today. He politely declined offer for flu vaccine today.  Diet: He reports that he has been eating a balanced diet overall.  Exercise: He reports that he is exercising regularly. Wt Readings from Last 5 Encounters:  08/31/22 158 lb (71.7 kg)  06/29/22 154 lb 12.8 oz (70.2 kg)  06/08/22 155 lb (70.3 kg)  03/29/22 155 lb (70.3 kg)  02/02/22 153 lb (69.4 kg)   Vision: He wears 1 contact lens. His last eye exam was in 07/2022.  Dental: He states that he is not UTD on dental exams.  He denies having any fever, new muscle pain, new joint pain, new moles, congestion, sinus pain, sore throat, chest pain, palpitations, cough, SOB, wheezing, n/v/d, constipation, blood in stool, dysuria, frequency, hematuria, or headaches at this time.    Past Medical History:  Diagnosis Date   Acute appendicitis 01/18/2022   Allergy    Anxiety  GERD (gastroesophageal reflux disease)    Hyperlipemia    Obstructive sleep apnea 01/18/2011   Pneumonia    Sleep apnea     Past Surgical History:  Procedure Laterality Date   APPENDECTOMY     COLONOSCOPY     INGUINAL HERNIA REPAIR     LAPAROSCOPIC APPENDECTOMY N/A 01/18/2022   Procedure: APPENDECTOMY LAPAROSCOPIC;  Surgeon: Darnell Level, MD;  Location: [facility];  Service: General;  Laterality: N/A;    Family History  Problem Relation Age of Onset    CAD Father 65       s/p cabg x 3   Anxiety disorder Sister    Kidney disease Maternal Grandmother    Colon cancer Maternal Grandfather 88   Bladder Cancer Maternal Grandfather    Skin cancer Maternal Grandfather    Stroke Maternal Grandfather    Hypertension Paternal Grandfather    Stroke Paternal Grandfather    Prostate cancer Paternal Grandfather    Heart disease Paternal Grandfather    Stomach cancer Neg Hx     Social History   Socioeconomic History   Marital status: Divorced    Spouse name: Sales promotion account executive   Number of children: 2   Years of education: Not on file   Highest education level: Not on file  Occupational History   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   2 children   Married      International aid/development worker of Corporate investment banker Strain: Not on file  Food Insecurity: Not on file  Transportation Needs: Not on file  Physical Activity: Not on file  Stress: Not on file  Social Connections: Not on file  Intimate Partner Violence: Not on file    Outpatient Medications Prior to Visit  Medication Sig Dispense Refill   AMBULATORY NON FORMULARY MEDICATION Medication Name: Diltiazem 2% gel - Using your index finger, apply a small amount of medication inside the rectum up to your first knuckle/joint three times daily x 6-8 weeks. (Patient not taking: Reported on 06/29/2022) 30 g 1    AMBULATORY NON FORMULARY MEDICATION Medication Name: Nitroglycerin 0.125% gel with Lidocaine 5% - Apply a pea size amount to your rectum to the first knuckle three times daily x 6-8 weeks. 45 g 1   pantoprazole (PROTONIX) 40 MG tablet TAKE 1 TABLET (40 MG TOTAL) BY MOUTH DAILY. 90 tablet 1   PARoxetine (PAXIL) 40 MG tablet TAKE 1 TABLET (40 MG TOTAL) BY MOUTH EVERY MORNING. 90 tablet 1   ALPRAZolam (XANAX) 1 MG tablet Take 1 tablet (1 mg total) by mouth daily as needed for anxiety. 30 tablet 0   Facility-Administered Medications Prior to Visit  Medication Dose Route Frequency Provider Last Rate Last Admin   0.9 %  sodium chloride infusion  500 mL Intravenous Once  Mauri Pole, MD        Allergies  Allergen Reactions   Beef-Derived Products     Confirmed through allergy testing   Corn-Containing Products     Confirmed through allergy testing   Eggs Or Egg-Derived Products     Confirmed through allergy testing   Lipitor [Atorvastatin] Other (See Comments)    rhabdomyolysis   Milk-Related Compounds     Confirmed through allergy testing   Peanut-Containing Drug Products     Confirmed through allergy testing   Red Dye Hives    Patient reported not having a reaction, so not sure   Wheat     Confirmed through allergy testing    Review of Systems  Constitutional:  Negative for fever.       (-)unexpected weight change (-)Adenopathy  HENT:  Negative for congestion, sinus pain and sore throat.   Eyes:        (-)Visual disturbance  Respiratory:  Negative for cough, shortness of breath and wheezing.   Cardiovascular:  Negative for chest pain, palpitations and leg swelling.  Gastrointestinal:  Negative for blood in stool, constipation, diarrhea, nausea and vomiting.  Genitourinary:  Negative for dysuria, frequency and hematuria.  Musculoskeletal:        (-)new muscle pain (-)new joint pain  Skin:        (-)new moles  Neurological:  Negative for dizziness and headaches.   Psychiatric/Behavioral:  Negative for depression. The patient is not nervous/anxious.        Objective:    Physical Exam Constitutional:      General: He is not in acute distress.    Appearance: Normal appearance. He is not ill-appearing.  HENT:     Head: Normocephalic and atraumatic.     Right Ear: Tympanic membrane, ear canal and external ear normal.     Left Ear: Tympanic membrane, ear canal and external ear normal.  Eyes:     Extraocular Movements: Extraocular movements intact.     Right eye: No nystagmus.     Left eye: No nystagmus.     Pupils: Pupils are equal, round, and reactive to light.  Cardiovascular:     Rate and Rhythm: Normal rate and regular rhythm.     Heart sounds: Normal heart sounds. No murmur heard.    No gallop.  Pulmonary:     Effort: Pulmonary effort is normal. No respiratory distress.     Breath sounds: Normal breath sounds. No wheezing or rales.  Abdominal:     General: Bowel sounds are normal. There is no distension.     Palpations: Abdomen is soft.     Tenderness: There is no abdominal tenderness. There is no guarding.  Musculoskeletal:     Comments: 5/5 strength in both upper and lower extremities  Skin:    General: Skin is warm and dry.  Neurological:     Mental Status: He is alert and oriented to person, place, and time.     Deep Tendon Reflexes:     Reflex Scores:      Patellar reflexes are 2+ on the right side and 2+ on the left side. Psychiatric:        Judgment: Judgment normal.     BP 130/86 (BP Location: Right Arm, Patient Position: Sitting, Cuff Size: Small)   Pulse 73   Temp 97.8 F (36.6 C) (Oral)   Resp 16   Ht 5\' 10"  (1.778 m)   Wt 158 lb (71.7 kg)   SpO2 100%  BMI 22.67 kg/m  Wt Readings from Last 3 Encounters:  08/31/22 158 lb (71.7 kg)  06/29/22 154 lb 12.8 oz (70.2 kg)  06/08/22 155 lb (70.3 kg)       Assessment & Plan:  Routine general medical examination at a health care facility  Elevated LFTs -      Comprehensive metabolic panel  Mixed hyperlipidemia Assessment & Plan: Lab Results  Component Value Date   CHOL 121 07/31/2021   HDL 48.10 07/31/2021   LDLCALC 59 07/31/2021   LDLDIRECT 67.0 02/01/2018   TRIG 69.0 07/31/2021   CHOLHDL 3 07/31/2021   Off of statin due to rhabdomyolysis.  Repeat lipid panel.   Orders: -     Lipid panel  Gastroesophageal reflux disease, unspecified whether esophagitis present Assessment & Plan: He is continuing pantoprazole daily at the direction of GI.     Obstructive sleep apnea Assessment & Plan: No longer using cpap.    Orders: -     Ambulatory referral to Pulmonology  Anxiety Assessment & Plan: Stable on paxil and prn xanax.  Controlled substance contract updated today.   Orders: -     DRUG MONITORING, PANEL 8 WITH CONFIRMATION, URINE  Need for Td vaccine -     Td vaccine greater than or equal to 7yo preservative free IM  Anal fissure Assessment & Plan: Clinically resolved. Advised him to call GI if he has recurrent pain.   Nicotine abuse Assessment & Plan: Encouraged nicotine pouch cessation.    Other orders -     ALPRAZolam; Take 1 tablet (1 mg total) by mouth daily as needed for anxiety.  Dispense: 30 tablet; Refill: 0   Continue healthy diet and regular exercise.  Td today. Declines flu shot/covid shot.   I,Alexis Herring,acting as a Education administrator for [employer], NP.,have documented all relevant documentation on the behalf of Nance Pear, NP,as directed by  Nance Pear, NP while in the presence of Nance Pear, NP.   I, Nance Pear, NP, personally preformed the services described in this documentation.  All medical record entries made by the scribe were at my direction and in my presence.  I have reviewed the chart and discharge instructions (if applicable) and agree that the record reflects my personal performance and is accurate and complete. 08/31/22   Nance Pear,  NP

## 2022-08-31 NOTE — Patient Instructions (Signed)
Please schedule routine dental visit.   

## 2022-08-31 NOTE — Assessment & Plan Note (Signed)
No longer using cpap.

## 2022-08-31 NOTE — Assessment & Plan Note (Signed)
Continue healthy diet and regular exercise.  Td today. Declines flu shot/covid shot.

## 2022-08-31 NOTE — Assessment & Plan Note (Addendum)
Lab Results  Component Value Date   CHOL 121 07/31/2021   HDL 48.10 07/31/2021   LDLCALC 59 07/31/2021   LDLDIRECT 67.0 02/01/2018   TRIG 69.0 07/31/2021   CHOLHDL 3 07/31/2021   Off of statin due to rhabdomyolysis.  Repeat lipid panel.

## 2022-08-31 NOTE — Assessment & Plan Note (Signed)
He is continuing pantoprazole daily at the direction of GI.

## 2022-08-31 NOTE — Assessment & Plan Note (Signed)
Stable on paxil and prn xanax.  Controlled substance contract updated today.

## 2022-09-01 LAB — DRUG MONITORING, PANEL 8 WITH CONFIRMATION, URINE
6 Acetylmorphine: NEGATIVE ng/mL (ref <10)
Alcohol Metabolites: NEGATIVE ng/mL (ref <500)
Amphetamines: NEGATIVE ng/mL (ref <500)
Benzodiazepines: NEGATIVE ng/mL (ref <100)
Buprenorphine, Urine: NEGATIVE ng/mL (ref <5)
Cocaine Metabolite: NEGATIVE ng/mL (ref <150)
Creatinine: 28.6 mg/dL (ref >=20.0)
MDMA: NEGATIVE ng/mL (ref <500)
Marijuana Metabolite: NEGATIVE ng/mL (ref <20)
Opiates: NEGATIVE ng/mL (ref <100)
Oxidant: NEGATIVE ug/mL (ref <200)
Oxycodone: NEGATIVE ng/mL (ref <100)
pH: 7.1 (ref 4.5–9.0)

## 2022-09-01 LAB — DM TEMPLATE

## 2022-09-18 ENCOUNTER — Other Ambulatory Visit: Payer: Self-pay | Admitting: Family

## 2022-09-19 MED ORDER — PANTOPRAZOLE SODIUM 40 MG PO TBEC
40.0000 mg | DELAYED_RELEASE_TABLET | Freq: Every day | ORAL | 1 refills | Status: DC
  Filled 2022-09-19: qty 90, fill #0
  Filled 2022-09-21 – 2022-10-02 (×2): qty 90, 90d supply, fill #0
  Filled 2022-12-27: qty 90, 90d supply, fill #1

## 2022-09-19 MED ORDER — PAROXETINE HCL 40 MG PO TABS
40.0000 mg | ORAL_TABLET | Freq: Every morning | ORAL | 1 refills | Status: DC
  Filled 2022-09-19: qty 90, fill #0
  Filled 2022-09-21 – 2022-10-02 (×2): qty 90, 90d supply, fill #0
  Filled 2022-12-27: qty 90, 90d supply, fill #1

## 2022-09-20 ENCOUNTER — Other Ambulatory Visit (HOSPITAL_BASED_OUTPATIENT_CLINIC_OR_DEPARTMENT_OTHER): Payer: Self-pay

## 2022-09-21 ENCOUNTER — Other Ambulatory Visit: Payer: Self-pay

## 2022-10-02 ENCOUNTER — Other Ambulatory Visit: Payer: Self-pay | Admitting: Family

## 2022-10-04 ENCOUNTER — Other Ambulatory Visit: Payer: Self-pay

## 2022-10-04 ENCOUNTER — Other Ambulatory Visit (HOSPITAL_BASED_OUTPATIENT_CLINIC_OR_DEPARTMENT_OTHER): Payer: Self-pay

## 2022-10-04 MED ORDER — ALPRAZOLAM 1 MG PO TABS
1.0000 mg | ORAL_TABLET | Freq: Every day | ORAL | 0 refills | Status: DC | PRN
  Filled 2022-10-04: qty 30, 30d supply, fill #0

## 2022-10-04 NOTE — Telephone Encounter (Signed)
Requesting: alprazolam 58m Contract: 06/30/2019 UDS: 08/31/22 Last Visit: 08/31/22 Next Visit: None Last Refill: 08/31/22 #30 and 0RF   Please Advise

## 2022-10-05 ENCOUNTER — Other Ambulatory Visit (HOSPITAL_BASED_OUTPATIENT_CLINIC_OR_DEPARTMENT_OTHER): Payer: Self-pay

## 2022-10-22 ENCOUNTER — Encounter: Payer: Self-pay | Admitting: Primary Care

## 2022-10-22 ENCOUNTER — Ambulatory Visit (INDEPENDENT_AMBULATORY_CARE_PROVIDER_SITE_OTHER): Payer: 59 | Admitting: Primary Care

## 2022-10-22 VITALS — BP 120/80 | HR 74 | Ht 70.0 in | Wt 162.2 lb

## 2022-10-22 DIAGNOSIS — G4733 Obstructive sleep apnea (adult) (pediatric): Secondary | ICD-10-CM

## 2022-10-22 DIAGNOSIS — Z8669 Personal history of other diseases of the nervous system and sense organs: Secondary | ICD-10-CM

## 2022-10-22 DIAGNOSIS — F419 Anxiety disorder, unspecified: Secondary | ICD-10-CM | POA: Diagnosis not present

## 2022-10-22 DIAGNOSIS — R0681 Apnea, not elsewhere classified: Secondary | ICD-10-CM

## 2022-10-22 NOTE — Progress Notes (Signed)
$'[name]$  ID: Brandon Jensen, male    DOB: 10-Jun-1981, 42 y.o.   MRN: [id number]  Chief Complaint  Patient presents with   Consult    Sleep study done 10 + years [facility] 3    Referring provider: Debbrah Alar, NP  HPI: 42 year old, former smoker. PMH significant for OSA, allergic rhinitis, rhabdomyolysis, hyperlipidemia, elevated LFTs.   10/22/2022 Patient presents today for sleep consult. He has a history of sleep apnea, previously on CPAP. He stopped wearing CPAP several years ago. He did report benefit from use. He's has since lost approximately 40-50 lbs since his sleep study. He has been having increased symptoms of sleep paralysis last couple months. He moves a lot in his sleep and acts out his dream. He will kick and thrash in his sleep. He takes 1/2 xanax before going to sleep which helps him fall asleep, he has been on this medication for some time. He prefers to not take medication.  He works third shift as a Music therapist. Denies narcolepsy. Cataplexy or sleep walking.   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  Do you currently wear CPAP- no Do you current wear oxygen- no Epworth- 3   Allergies  Allergen Reactions   Beef-Derived Products     Confirmed through allergy testing   Corn-Containing Products     Confirmed through allergy testing   Egg-Derived Products     Confirmed through allergy testing   Lipitor [Atorvastatin] Other (See Comments)    rhabdomyolysis   Milk-Related Compounds     Confirmed through allergy testing   Peanut-Containing Drug Products     Confirmed through allergy testing   Red Dye Hives    Patient reported not having a  reaction, so not sure   Wheat     Confirmed through allergy testing    Immunization History  Administered Date(s) Administered   Influenza Split 08/20/2011   Influenza Whole 06/06/2009   Td 08/31/2022   Tdap 09/27/2012    Past Medical History:  Diagnosis Date   Acute appendicitis 01/18/2022   Allergy    Anxiety    GERD (gastroesophageal reflux disease)    Hyperlipemia    Obstructive sleep apnea 01/18/2011   Pneumonia    Sleep apnea     Tobacco History: Social History   Tobacco Use  Smoking Status Former   Types: Cigarettes   Quit date: 11/11/2013   Years since quitting: 8.9  Smokeless Tobacco Current   Types: Snuff   Ready to quit: Not Answered Counseling given: Not Answered   Outpatient Medications Prior to Visit  Medication Sig Dispense Refill   ALPRAZolam (XANAX) 1 MG tablet Take 1 tablet (1 mg total) by mouth daily as needed for anxiety. 30 tablet 0   pantoprazole (PROTONIX) 40 MG tablet Take 1 tablet (40 mg total) by mouth daily. 90 tablet 1   PARoxetine (PAXIL) 40 MG tablet Take 1 tablet (40 mg total) by mouth every morning. 90 tablet 1   AMBULATORY NON FORMULARY MEDICATION Medication Name: Diltiazem 2% gel - Using your index finger, apply a small amount of medication inside the rectum up to your first knuckle/joint three  times daily x 6-8 weeks. (Patient not taking: Reported on 06/29/2022) 30 g 1   AMBULATORY NON FORMULARY MEDICATION Medication Name: Nitroglycerin 0.125% gel with Lidocaine 5% - Apply a pea size amount to your rectum to the first knuckle three times daily x 6-8 weeks. (Patient not taking: Reported on 10/22/2022) 45 g 1   Facility-Administered Medications Prior to Visit  Medication Dose Route Frequency Provider Last Rate Last Admin   0.9 %  sodium chloride infusion  500 mL Intravenous Once Nandigam, Venia Minks, MD        Review of Systems  Review of Systems  Constitutional: Negative.   HENT: Negative.    Respiratory:  Positive for apnea.    Psychiatric/Behavioral: Negative.      Physical Exam  BP 120/80 (BP Location: Left Arm, Patient Position: Sitting, Cuff Size: Normal)   Pulse 74   Ht '5\' 10"'$  (1.778 m)   Wt 162 lb 3.2 oz (73.6 kg)   SpO2 100%   BMI 23.27 kg/m  Physical Exam Constitutional:      General: He is not in acute distress.    Appearance: Normal appearance. He is not ill-appearing.  HENT:     Head: Normocephalic and atraumatic.     Mouth/Throat:     Mouth: Mucous membranes are moist.     Pharynx: Oropharynx is clear.  Cardiovascular:     Rate and Rhythm: Normal rate and regular rhythm.  Pulmonary:     Effort: Pulmonary effort is normal.     Breath sounds: Normal breath sounds.  Musculoskeletal:        General: Normal range of motion.  Skin:    General: Skin is warm and dry.  Neurological:     General: No focal deficit present.     Mental Status: He is alert and oriented to person, place, and time. Mental status is at baseline.  Psychiatric:        Mood and Affect: Mood normal.        Behavior: Behavior normal.        Thought Content: Thought content normal.        Judgment: Judgment normal.      Lab Results:  CBC    Component Value Date/Time   WBC 5.7 02/11/2022 0733   RBC 4.57 02/11/2022 0733   HGB 14.4 02/11/2022 0733   HCT 41.5 02/11/2022 0733   PLT 229.0 02/11/2022 0733   MCV 90.9 02/11/2022 0733   MCH 32.0 01/17/2022 2120   MCHC 34.6 02/11/2022 0733   RDW 13.1 02/11/2022 0733   LYMPHSABS 2.5 02/11/2022 0733   MONOABS 0.4 02/11/2022 0733   EOSABS 0.2 02/11/2022 0733   BASOSABS 0.0 02/11/2022 0733    BMET    Component Value Date/Time   NA 138 08/31/2022 0748   K 3.8 08/31/2022 0748   CL 105 08/31/2022 0748   CO2 24 08/31/2022 0748   GLUCOSE 90 08/31/2022 0748   BUN 15 08/31/2022 0748   CREATININE 0.85 08/31/2022 0748   CREATININE 1.10 09/27/2012 1035   CALCIUM 9.3 08/31/2022 0748   GFRNONAA >60 01/17/2022 2120   GFRNONAA 89 09/27/2012 1035   GFRAA >89 09/27/2012  1035    BNP No results found for: "BNP"  ProBNP No results found for: "PROBNP"  Imaging: No results found.   Assessment & Plan:   Obstructive sleep apnea - Patient has a history of obstructive sleep apnea, previously on CPAP.  Patient stopped wearing CPAP several years ago. He has since lost approximately  40 pounds. Snoring improved. He continues to have symptoms of witnessed apnea and sleep paralysis. Recommend getting in lab sleep study but patient declined.  Patient would like to do home sleep study to reevaluate for OSA.  He is open to restarting CPAP if needed. 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  Advised against driving if experiencing excessive daytime sleepiness or fatigue.  Instructed patient to avoid alcohol use prior to bedtime. Follow-up after sleep study to review results and treatment options if needed.  Anxiety - Stable on paxil and prn xanax 0.'5mg'$  at bedtime    Martyn Ehrich, NP 10/22/2022

## 2022-10-22 NOTE — Patient Instructions (Addendum)
Sleep apnea is defined as period of 10 seconds or longer when you stop breathing at night. This can happen multiple times a night. Dx sleep apnea is when this occurs more than 5 times an hour.    Mild OSA 5-15 apneic events an hour Moderate OSA 15-30 apneic events an hour Severe OSA > 30 apneic events an hour   Untreated sleep apnea puts you at higher risk for cardiac arrhythmias, pulmonary HTN, stroke and diabetes  Treatment options include weight loss, side sleeping position, oral appliance, CPAP therapy or referral to ENT for possible surgical options    Recommendations: Focus on side sleeping position or elevate head with wedge pillow 30 degrees Do not drive if experiencing excessive daytime sleepiness of fatigue  Avoid alcohol prior to bedtime as this can worsen sleep apnea    Orders: Home sleep study re: loud snoring    Follow-up: Please call to schedule follow-up 1-2 weeks after completing home sleep study to review results and treatment if needed (can be virtual)   Sleep Apnea Sleep apnea is a condition in which breathing pauses or becomes shallow during sleep. People with sleep apnea usually snore loudly. They may have times when they gasp and stop breathing for 10 seconds or more during sleep. This may happen many times during the night. Sleep apnea disrupts your sleep and keeps your body from getting the rest that it needs. This condition can increase your risk of certain health problems, including: Heart attack. Stroke. Obesity. Type 2 diabetes. Heart failure. Irregular heartbeat. High blood pressure. The goal of treatment is to help you breathe normally again. What are the causes?  The most common cause of sleep apnea is a collapsed or blocked airway. There are three kinds of sleep apnea: Obstructive sleep apnea. This kind is caused by a blocked or collapsed airway. Central sleep apnea. This kind happens when the part of the brain that controls breathing does not  send the correct signals to the muscles that control breathing. Mixed sleep apnea. This is a combination of obstructive and central sleep apnea. What increases the risk? You are more likely to develop this condition if you: Are overweight. Smoke. Have a smaller than normal airway. Are older. Are male. Drink alcohol. Take sedatives or tranquilizers. Have a family history of sleep apnea. Have a tongue or tonsils that are larger than normal. What are the signs or symptoms? Symptoms of this condition include: Trouble staying asleep. Loud snoring. Morning headaches. Waking up gasping. Dry mouth or sore throat in the morning. Daytime sleepiness and tiredness. If you have daytime fatigue because of sleep apnea, you may be more likely to have: Trouble concentrating. Forgetfulness. Irritability or mood swings. Personality changes. Feelings of depression. Sexual dysfunction. This may include loss of interest if you are male, or erectile dysfunction if you are male. How is this diagnosed? This condition may be diagnosed with: A medical history. A physical exam. A series of tests that are done while you are sleeping (sleep study). These tests are usually done in a sleep lab, but they may also be done at home. How is this treated? Treatment for this condition aims to restore normal breathing and to ease symptoms during sleep. It may involve managing health issues that can affect breathing, such as high blood pressure or obesity. Treatment may include: Sleeping on your side. Using a decongestant if you have nasal congestion. Avoiding the use of depressants, including alcohol, sedatives, and narcotics. Losing weight if you are overweight.  Making changes to your diet. Quitting smoking. Using a device to open your airway while you sleep, such as: An oral appliance. This is a custom-made mouthpiece that shifts your lower jaw forward. A continuous positive airway pressure (CPAP) device.  This device blows air through a mask when you breathe out (exhale). A nasal expiratory positive airway pressure (EPAP) device. This device has valves that you put into each nostril. A bi-level positive airway pressure (BIPAP) device. This device blows air through a mask when you breathe in (inhale) and breathe out (exhale). Having surgery if other treatments do not work. During surgery, excess tissue is removed to create a wider airway. Follow these instructions at home: Lifestyle Make any lifestyle changes that your health care provider recommends. Eat a healthy, well-balanced diet. Take steps to lose weight if you are overweight. Avoid using depressants, including alcohol, sedatives, and narcotics. Do not use any products that contain nicotine or tobacco. These products include cigarettes, chewing tobacco, and vaping devices, such as e-cigarettes. If you need help quitting, ask your health care provider. General instructions Take over-the-counter and prescription medicines only as told by your health care provider. If you were given a device to open your airway while you sleep, use it only as told by your health care provider. If you are having surgery, make sure to tell your health care provider you have sleep apnea. You may need to bring your device with you. Keep all follow-up visits. This is important. Contact a health care provider if: The device that you received to open your airway during sleep is uncomfortable or does not seem to be working. Your symptoms do not improve. Your symptoms get worse. Get help right away if: You develop: Chest pain. Shortness of breath. Discomfort in your back, arms, or stomach. You have: Trouble speaking. Weakness on one side of your body. Drooping in your face. These symptoms may represent a serious problem that is an emergency. Do not wait to see if the symptoms will go away. Get medical help right away. Call your local emergency services (911 in  the [place] Do not drive yourself to the hospital. Summary Sleep apnea is a condition in which breathing pauses or becomes shallow during sleep. The most common cause is a collapsed or blocked airway. The goal of treatment is to restore normal breathing and to ease symptoms during sleep. This information is not intended to replace advice given to you by your health care provider. Make sure you discuss any questions you have with your health care provider. Document Revised: 03/11/2021 Document Reviewed: 07/11/2020 Elsevier Patient Education  Green.

## 2022-10-22 NOTE — Assessment & Plan Note (Addendum)
-   Patient has a history of obstructive sleep apnea, previously on CPAP.  Patient stopped wearing CPAP several years ago. He has since lost approximately 40 pounds. Snoring improved. He continues to have symptoms of witnessed apnea and sleep paralysis. Recommend getting in lab sleep study but patient declined.  Patient would like to do home sleep study to reevaluate for OSA.  He is open to restarting CPAP if needed. 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  Advised against driving if experiencing excessive daytime sleepiness or fatigue.  Instructed patient to avoid alcohol use prior to bedtime. Follow-up after sleep study to review results and treatment options if needed.

## 2022-10-22 NOTE — Assessment & Plan Note (Signed)
-   Stable on paxil and prn xanax 0.5mg  at bedtime

## 2022-10-24 NOTE — Progress Notes (Signed)
Reviewed and agree with assessment/plan.   Chesley Mires, MD [facility] Pulmonary/Critical Care 10/24/2022, 2:10 PM Pager:  [phone]

## 2022-11-02 ENCOUNTER — Other Ambulatory Visit: Payer: Self-pay | Admitting: Family

## 2022-11-02 MED ORDER — ALPRAZOLAM 1 MG PO TABS
1.0000 mg | ORAL_TABLET | Freq: Every day | ORAL | 0 refills | Status: DC | PRN
  Filled 2022-11-02: qty 30, 30d supply, fill #0

## 2022-11-02 NOTE — Telephone Encounter (Signed)
Requesting: alprazolam 1mg   Contract:  06/20/2019 UDS: 08/31/22 Last Visit: 08/31/22 Next Visit: None Last Refill: 10/04/22 #30 and 0RF   Please Advise

## 2022-11-03 ENCOUNTER — Other Ambulatory Visit (HOSPITAL_BASED_OUTPATIENT_CLINIC_OR_DEPARTMENT_OTHER): Payer: Self-pay

## 2022-11-17 ENCOUNTER — Encounter: Payer: Self-pay | Admitting: Family

## 2022-11-17 ENCOUNTER — Other Ambulatory Visit (HOSPITAL_BASED_OUTPATIENT_CLINIC_OR_DEPARTMENT_OTHER): Payer: Self-pay

## 2022-11-17 ENCOUNTER — Other Ambulatory Visit: Payer: Self-pay | Admitting: Family

## 2022-11-17 MED ORDER — EPINEPHRINE 0.3 MG/0.3ML IJ SOAJ
0.3000 mg | INTRAMUSCULAR | 0 refills | Status: DC | PRN
  Filled 2022-11-17: qty 2, 2d supply, fill #0

## 2022-12-03 ENCOUNTER — Other Ambulatory Visit: Payer: Self-pay | Admitting: Family Medicine

## 2022-12-06 ENCOUNTER — Other Ambulatory Visit: Payer: Self-pay

## 2022-12-06 ENCOUNTER — Other Ambulatory Visit (HOSPITAL_BASED_OUTPATIENT_CLINIC_OR_DEPARTMENT_OTHER): Payer: Self-pay

## 2022-12-06 MED ORDER — ALPRAZOLAM 1 MG PO TABS
1.0000 mg | ORAL_TABLET | Freq: Every day | ORAL | 0 refills | Status: DC | PRN
  Filled 2022-12-06: qty 30, 30d supply, fill #0

## 2022-12-06 NOTE — Telephone Encounter (Signed)
Requesting: alprazolam   Contract: 08/31/22 UDS: 08/31/22 Last Visit: 08/31/22 Next Visit: None Last Refill: 11/02/22 #30 and 0RF   Please Advise

## 2023-01-25 ENCOUNTER — Other Ambulatory Visit (HOSPITAL_BASED_OUTPATIENT_CLINIC_OR_DEPARTMENT_OTHER): Payer: Self-pay

## 2023-01-25 ENCOUNTER — Other Ambulatory Visit: Payer: Self-pay | Admitting: Family

## 2023-01-25 MED ORDER — ALPRAZOLAM 1 MG PO TABS
1.0000 mg | ORAL_TABLET | Freq: Every day | ORAL | 0 refills | Status: DC | PRN
  Filled 2023-01-25: qty 30, 30d supply, fill #0

## 2023-01-25 NOTE — Telephone Encounter (Signed)
Requesting: alprazolam 1mg   Contract: 08/31/22 UDS: 08/31/22 Last Visit: 08/31/22 Next Visit: None Last Refill: 12/06/22 #30 and 0RF   Please Advise

## 2023-02-14 ENCOUNTER — Other Ambulatory Visit (HOSPITAL_BASED_OUTPATIENT_CLINIC_OR_DEPARTMENT_OTHER): Payer: Self-pay

## 2023-02-14 ENCOUNTER — Other Ambulatory Visit: Payer: Self-pay | Admitting: Family

## 2023-02-14 MED ORDER — PANTOPRAZOLE SODIUM 40 MG PO TBEC
40.0000 mg | DELAYED_RELEASE_TABLET | Freq: Every day | ORAL | 0 refills | Status: DC
  Filled 2023-02-14 – 2023-02-16 (×3): qty 90, 90d supply, fill #0

## 2023-02-14 MED ORDER — PAROXETINE HCL 40 MG PO TABS
40.0000 mg | ORAL_TABLET | Freq: Every morning | ORAL | 0 refills | Status: DC
  Filled 2023-02-14 – 2023-02-16 (×3): qty 90, 90d supply, fill #0

## 2023-02-15 ENCOUNTER — Other Ambulatory Visit (HOSPITAL_BASED_OUTPATIENT_CLINIC_OR_DEPARTMENT_OTHER): Payer: Self-pay

## 2023-02-16 ENCOUNTER — Encounter: Payer: Self-pay | Admitting: Family

## 2023-02-16 ENCOUNTER — Ambulatory Visit: Payer: 59 | Admitting: Family

## 2023-02-16 ENCOUNTER — Other Ambulatory Visit (HOSPITAL_BASED_OUTPATIENT_CLINIC_OR_DEPARTMENT_OTHER): Payer: Self-pay

## 2023-02-16 VITALS — BP 119/78 | HR 71 | Temp 98.4°F | Resp 20 | Ht 70.0 in | Wt 161.0 lb

## 2023-02-16 DIAGNOSIS — K219 Gastro-esophageal reflux disease without esophagitis: Secondary | ICD-10-CM | POA: Diagnosis not present

## 2023-02-16 DIAGNOSIS — E782 Mixed hyperlipidemia: Secondary | ICD-10-CM | POA: Diagnosis not present

## 2023-02-16 DIAGNOSIS — F419 Anxiety disorder, unspecified: Secondary | ICD-10-CM | POA: Diagnosis not present

## 2023-02-16 DIAGNOSIS — M6740 Ganglion, unspecified site: Secondary | ICD-10-CM | POA: Insufficient documentation

## 2023-02-16 MED ORDER — PAROXETINE HCL 40 MG PO TABS
40.0000 mg | ORAL_TABLET | Freq: Every morning | ORAL | 1 refills | Status: DC
  Filled 2023-02-16 – 2023-04-05 (×2): qty 90, 90d supply, fill #0
  Filled 2023-05-31 – 2023-07-02 (×3): qty 90, 90d supply, fill #1

## 2023-02-16 MED ORDER — PANTOPRAZOLE SODIUM 40 MG PO TBEC
40.0000 mg | DELAYED_RELEASE_TABLET | Freq: Every day | ORAL | 1 refills | Status: DC
  Filled 2023-02-16 – 2023-04-05 (×2): qty 90, 90d supply, fill #0
  Filled 2023-05-31 – 2023-07-02 (×3): qty 90, 90d supply, fill #1

## 2023-02-16 NOTE — Assessment & Plan Note (Signed)
Controlled substance contract is updated today.  Continue xanax prn.

## 2023-02-16 NOTE — Progress Notes (Signed)
Subjective:     Patient ID: Brandon Jensen, male    DOB: [birth year], 42 y.o.   MRN: [id number]  Chief Complaint  Patient presents with   6 month follow up    No other concerns    HPI  Discussed the use of AI scribe software for clinical note transcription with the patient, who gave verbal consent to proceed.  History of Present Illness   The patient, with a history of anxiety and gastroesophageal reflux disease (GERD), presents for routine follow-up and medication refills. He reports feeling 'pretty good' and continues to manage his anxiety with daily Paxil 40mg  and alprazolam 0.5mg  at bedtime. He also takes pantoprazole daily for GERD, as recommended by his gastroenterologist. He denies any new or worsening symptoms.  Additionally, the patient reports a new, non-painful, protruding mass on his hand. He is unsure of the origin but notes occasional discomfort while driving. He denies any recent trauma to the area.          Health Maintenance Due  Topic Date Due   Hepatitis C Screening  Never done    Past Medical History:  Diagnosis Date   Acute appendicitis 01/18/2022   Allergy    Anxiety    GERD (gastroesophageal reflux disease)    Hyperlipemia    Obstructive sleep apnea 01/18/2011   Pneumonia    Sleep apnea     Past Surgical History:  Procedure Laterality Date   APPENDECTOMY     COLONOSCOPY     INGUINAL HERNIA REPAIR     LAPAROSCOPIC APPENDECTOMY N/A 01/18/2022   Procedure: APPENDECTOMY LAPAROSCOPIC;  Surgeon: Darnell Level, MD;  Location: [facility];  Service: General;  Laterality: N/A;    Family History  Problem Relation Age of Onset   CAD Father 70       s/p cabg x 3   Anxiety disorder Sister    Kidney disease Maternal Grandmother    Colon cancer Maternal Grandfather 88   Bladder Cancer Maternal Grandfather    Skin cancer Maternal Grandfather    Stroke Maternal Grandfather    Hypertension Paternal Grandfather    Stroke Paternal Grandfather    Prostate  cancer Paternal Grandfather    Heart disease Paternal Grandfather    Stomach cancer Neg Hx     Social History   Socioeconomic History   Marital status: Divorced    Spouse name: Sales promotion account executive   Number of children: 2   Years of education: Not on file   Highest education level: Some college, no degree  Occupational History   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   2 children   Married      Chemical engineer Strain: Low Risk  (02/14/2023)   Overall Financial Resource Strain (CARDIA)  Difficulty of Paying Living Expenses: Not hard at all  Food Insecurity: No Food Insecurity (02/14/2023)   Hunger Vital Sign    Worried About Running Out of Food in the Last Year: Never true    Ran Out of Food in the Last Year: Never true  Transportation Needs: No Transportation Needs (02/14/2023)   PRAPARE - Administrator, Civil Service (Medical): No    Lack of Transportation (Non-Medical): No  Physical Activity: Insufficiently Active (02/14/2023)   Exercise Vital Sign    Days of Exercise per Week: 3 days    Minutes of Exercise per Session: 30 min  Stress: Stress Concern Present (02/14/2023)   [employer] of Occupational Health - Occupational Stress Questionnaire    Feeling of Stress : To some extent  Social Connections: Socially Isolated (02/14/2023)   Social Connection and  Isolation Panel [NHANES]    Frequency of Communication with Friends and Family: Never    Frequency of Social Gatherings with Friends and Family: Once a week    Attends Religious Services: Never    Database administrator or Organizations: No    Attends Engineer, structural: Not on file    Marital Status: Living with partner  Intimate Partner Violence: Not on file    Outpatient Medications Prior to Visit  Medication Sig Dispense Refill   ALPRAZolam (XANAX) 1 MG tablet Take 1 tablet (1 mg total) by mouth daily as needed for anxiety. 30 tablet 0   AMBULATORY NON FORMULARY MEDICATION Medication Name: Diltiazem 2% gel - Using your index finger, apply a small amount of medication inside the rectum up to your first knuckle/joint three times daily x 6-8 weeks. 30 g 1   AMBULATORY NON FORMULARY MEDICATION Medication Name: Nitroglycerin 0.125% gel with Lidocaine 5% - Apply a pea size amount to your rectum to the first knuckle three times daily x 6-8 weeks. 45 g 1   EPINEPHrine 0.3 mg/0.3 mL IJ SOAJ injection Inject 0.3 mg into the muscle as needed for anaphylaxis. 2 each 0   pantoprazole (PROTONIX) 40 MG tablet Take 1 tablet (40 mg total) by mouth daily. 90 tablet 0   PARoxetine (PAXIL) 40 MG tablet Take 1 tablet (40 mg total) by mouth every morning. 90 tablet 0   Facility-Administered Medications Prior to Visit  Medication Dose Route Frequency Provider Last Rate Last Admin   0.9 %  sodium chloride infusion  500 mL Intravenous Once Nandigam, Eleonore Chiquito, MD        Allergies  Allergen Reactions   Beef-Derived Products     Confirmed through allergy testing   Corn-Containing Products     Confirmed through allergy testing   Egg-Derived Products     Confirmed through allergy testing   Lipitor [Atorvastatin] Other (See Comments)    rhabdomyolysis   Milk-Related Compounds     Confirmed through allergy testing   Peanut-Containing Drug Products     Confirmed through allergy testing   Red Dye  Hives    Patient reported not having a reaction, so not sure   Wheat     Confirmed through allergy testing    ROS See HPI    Objective:    Physical Exam Constitutional:      General: He is not in acute distress.    Appearance: He is well-developed.  HENT:     Head: Normocephalic and atraumatic.  Cardiovascular:     Rate and Rhythm: Normal rate and regular rhythm.     Heart  sounds: No murmur heard. Pulmonary:     Effort: Pulmonary effort is normal. No respiratory distress.     Breath sounds: Normal breath sounds. No wheezing or rales.  Musculoskeletal:     Comments: Ganglion cyst right hand palmar surface overlying PIP, right 4th finger  Skin:    General: Skin is warm and dry.  Neurological:     Mental Status: He is alert and oriented to person, place, and time.  Psychiatric:        Behavior: Behavior normal.        Thought Content: Thought content normal.      BP 119/78 (BP Location: Right Arm, Patient Position: Sitting, Cuff Size: Normal)   Pulse 71   Temp 98.4 F (36.9 C)   Resp 20   Ht 5\' 10"  (1.778 m)   Wt 161 lb (73 kg)   SpO2 100%   BMI 23.10 kg/m  Wt Readings from Last 3 Encounters:  02/16/23 161 lb (73 kg)  10/22/22 162 lb 3.2 oz (73.6 kg)  08/31/22 158 lb (71.7 kg)       Assessment & Plan:   Problem List Items Addressed This Visit       Unprioritized   Mixed hyperlipidemia    Total cholesterol improved from 277 to 222 with lifestyle modifications. Patient is not currently on cholesterol medication. -Continue lifestyle modifications. -Consider rechecking cholesterol levels at next visit.      GERD    Stable on Pantoprazole. -Continue Pantoprazole as prescribed. -Refill Pantoprazole, Paroxetine, and Alprazolam.      Relevant Medications   pantoprazole (PROTONIX) 40 MG tablet   Ganglion cyst    New cyst on hand, not causing significant discomfort. -No intervention needed at this time. -If it becomes painful or bothersome, re-evaluation  can be considered.      Anxiety - Primary    Controlled substance contract is updated today.  Continue xanax prn.       Relevant Medications   PARoxetine (PAXIL) 40 MG tablet    I am having Brandon Bal "Joe" maintain his AMBULATORY NON FORMULARY MEDICATION, AMBULATORY NON FORMULARY MEDICATION, EPINEPHrine, ALPRAZolam, PARoxetine, and pantoprazole. We will continue to administer sodium chloride.  Meds ordered this encounter  Medications   PARoxetine (PAXIL) 40 MG tablet    Sig: Take 1 tablet (40 mg total) by mouth every morning.    Dispense:  90 tablet    Refill:  1    Order Specific Question:   Supervising Provider    Answer:   Danise Edge A [[id number]]   pantoprazole (PROTONIX) 40 MG tablet    Sig: Take 1 tablet (40 mg total) by mouth daily.    Dispense:  90 tablet    Refill:  1    Order Specific Question:   Supervising Provider    Answer:   Danise Edge A [[id number]]

## 2023-02-16 NOTE — Assessment & Plan Note (Signed)
Stable on Pantoprazole. -Continue Pantoprazole as prescribed. -Refill Pantoprazole, Paroxetine, and Alprazolam.

## 2023-02-16 NOTE — Assessment & Plan Note (Signed)
New cyst on hand, not causing significant discomfort. -No intervention needed at this time. -If it becomes painful or bothersome, re-evaluation can be considered.

## 2023-02-16 NOTE — Assessment & Plan Note (Signed)
Total cholesterol improved from 277 to 222 with lifestyle modifications. Patient is not currently on cholesterol medication. -Continue lifestyle modifications. -Consider rechecking cholesterol levels at next visit.

## 2023-02-24 ENCOUNTER — Other Ambulatory Visit: Payer: Self-pay | Admitting: Family

## 2023-03-01 ENCOUNTER — Other Ambulatory Visit (HOSPITAL_BASED_OUTPATIENT_CLINIC_OR_DEPARTMENT_OTHER): Payer: Self-pay

## 2023-03-01 ENCOUNTER — Other Ambulatory Visit: Payer: Self-pay | Admitting: Family

## 2023-03-01 ENCOUNTER — Other Ambulatory Visit: Payer: Self-pay

## 2023-03-01 MED ORDER — ALPRAZOLAM 1 MG PO TABS
1.0000 mg | ORAL_TABLET | Freq: Every day | ORAL | 0 refills | Status: DC | PRN
  Filled 2023-03-01: qty 30, 30d supply, fill #0

## 2023-03-28 ENCOUNTER — Encounter: Payer: Self-pay | Admitting: Family

## 2023-04-05 ENCOUNTER — Other Ambulatory Visit (HOSPITAL_BASED_OUTPATIENT_CLINIC_OR_DEPARTMENT_OTHER): Payer: Self-pay

## 2023-05-02 ENCOUNTER — Other Ambulatory Visit (HOSPITAL_BASED_OUTPATIENT_CLINIC_OR_DEPARTMENT_OTHER): Payer: Self-pay

## 2023-05-02 ENCOUNTER — Other Ambulatory Visit: Payer: Self-pay | Admitting: Family

## 2023-05-02 MED ORDER — ALPRAZOLAM 1 MG PO TABS
1.0000 mg | ORAL_TABLET | Freq: Every day | ORAL | 0 refills | Status: DC | PRN
  Filled 2023-05-02: qty 30, 30d supply, fill #0

## 2023-05-26 IMAGING — CT CT ABD-PELV W/ CM
2 of 5 series · 15 of 46 positions shown, 17 images · IV contrast (agent unspecified)
Comparison: None Available.

CLINICAL DATA: Right lower quadrant pain

EXAM:
CT ABDOMEN AND PELVIS WITH CONTRAST
TECHNIQUE: Multidetector CT imaging of the abdomen and pelvis was performed
using the standard protocol following bolus administration of
intravenous contrast.

[Series 2: axial st · axial · 0.78mm/px · z∈[+1085,+1490]mm · 12 of 97 slices shown, 14 images]
[im 8/97  soft-tissue]
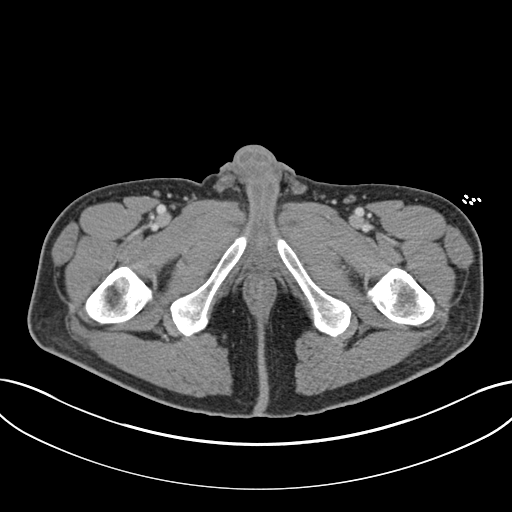
[im 8/97  bone]
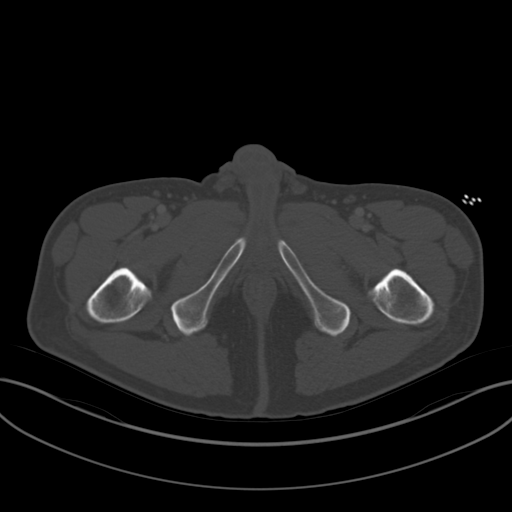
[im 15/97  soft-tissue]
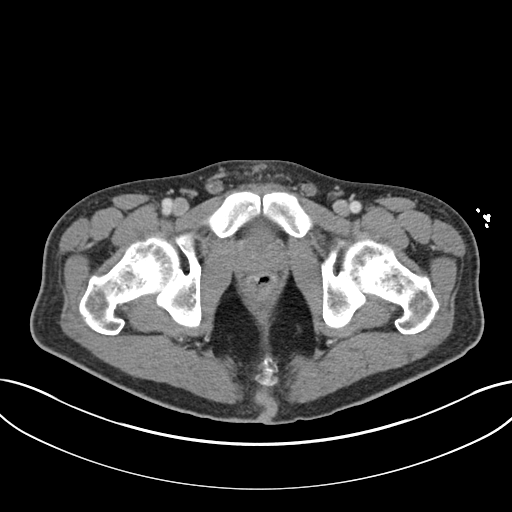
[im 23/97  soft-tissue]
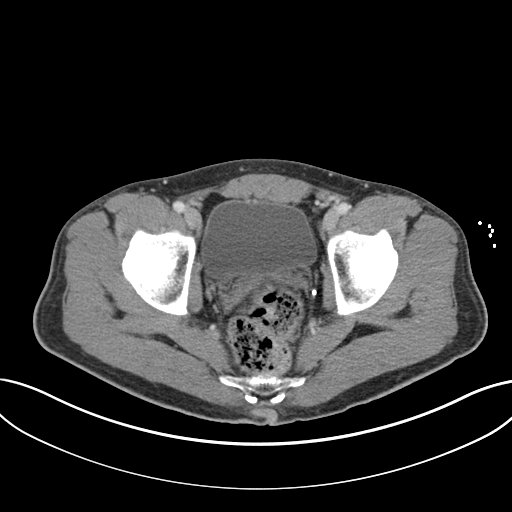
[im 30/97  soft-tissue]
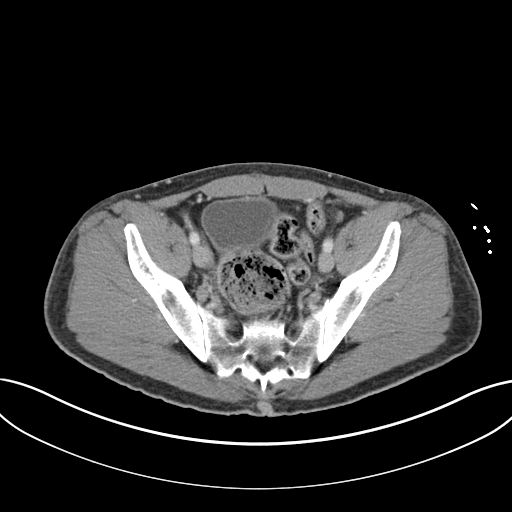
[im 37/97  soft-tissue]
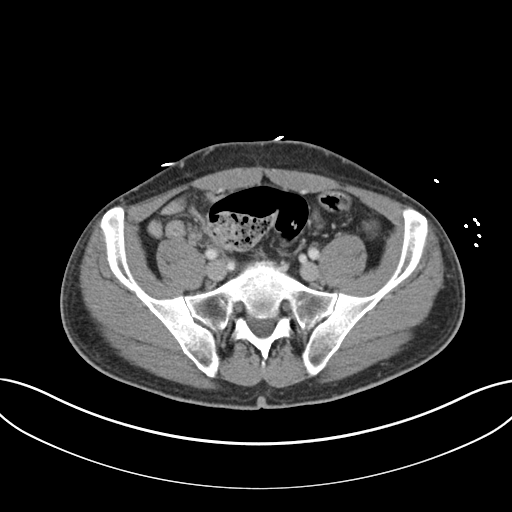
[im 45/97  soft-tissue]
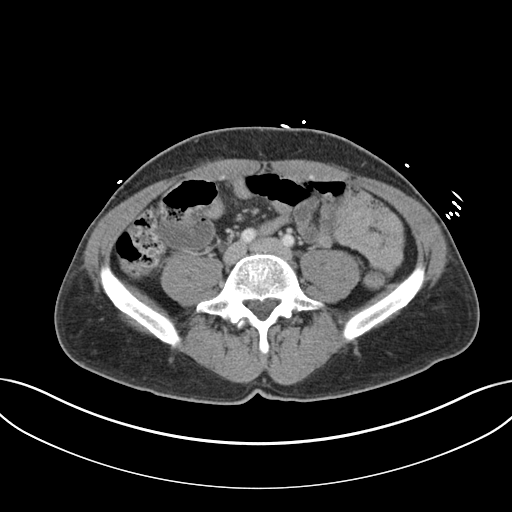
[im 52/97  soft-tissue]
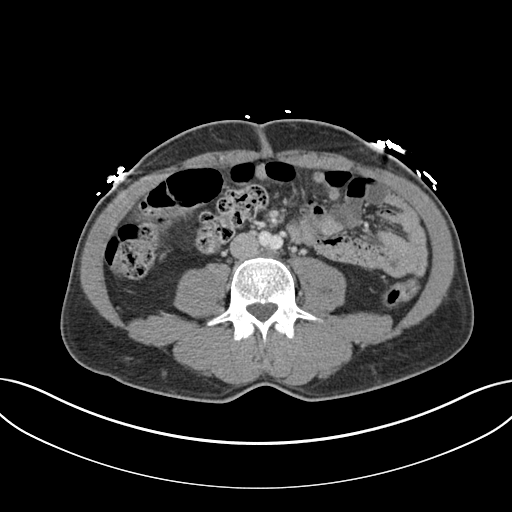
[im 60/97  soft-tissue]
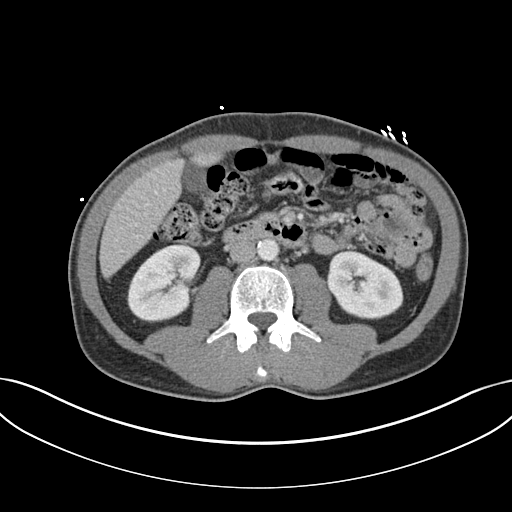
[im 67/97  soft-tissue]
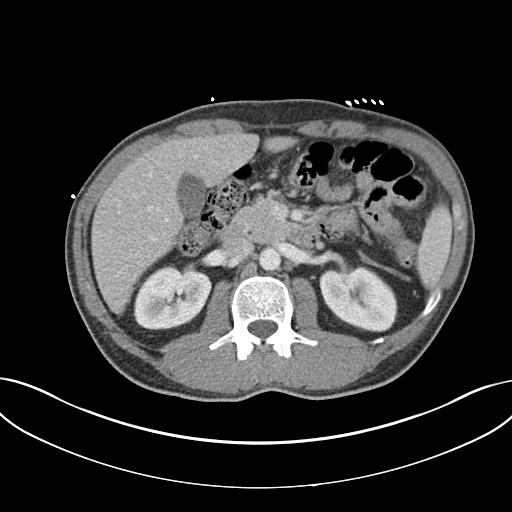
[im 67/97  bone]
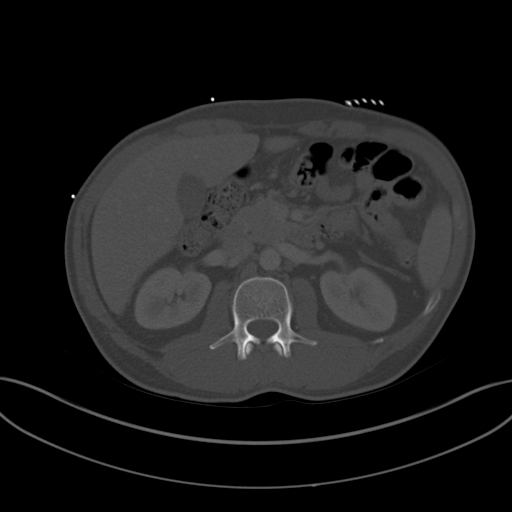
[im 74/97  soft-tissue]
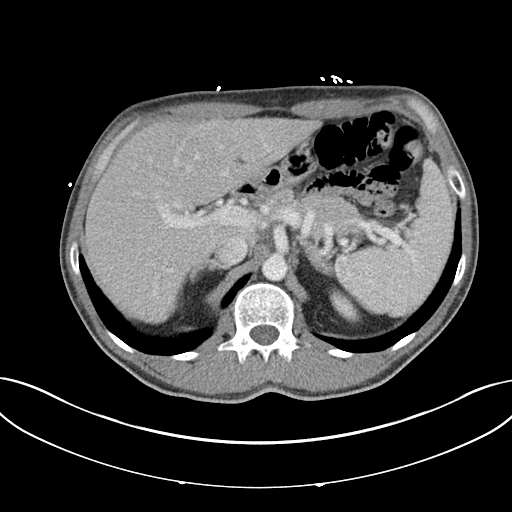
[im 82/97  soft-tissue]
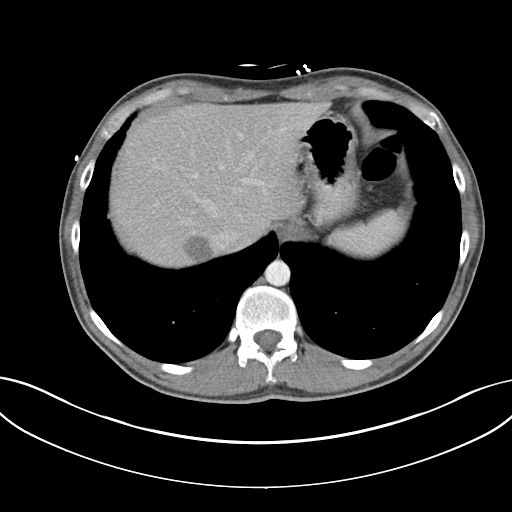
[im 89/97  soft-tissue]
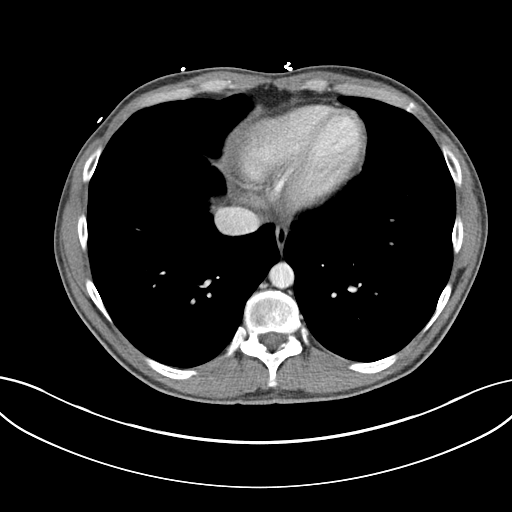

[Series 5: coronal st · coronal · 0.71mm/px · 3 of 142 slices shown]
[im 48/142  soft-tissue]
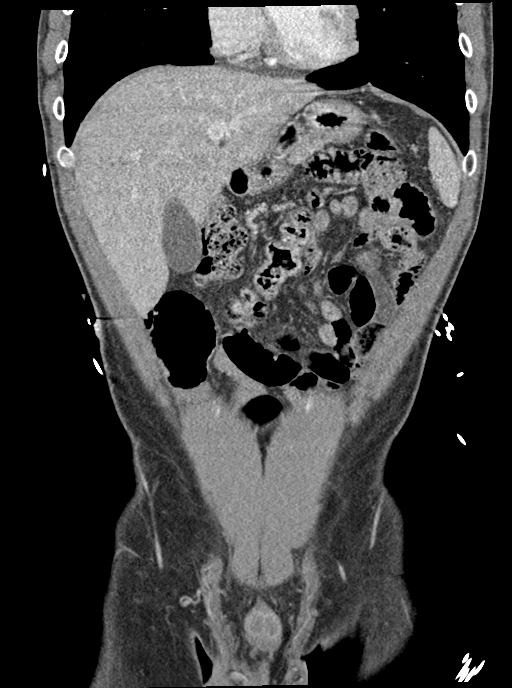
[im 63/142  soft-tissue]
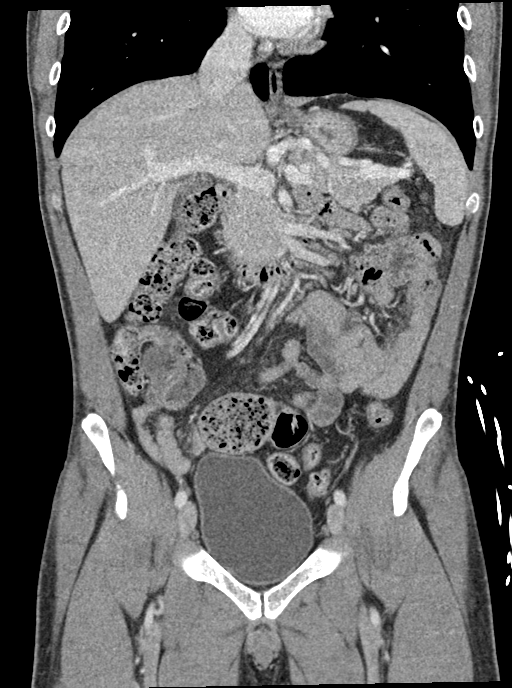
[im 79/142  soft-tissue]
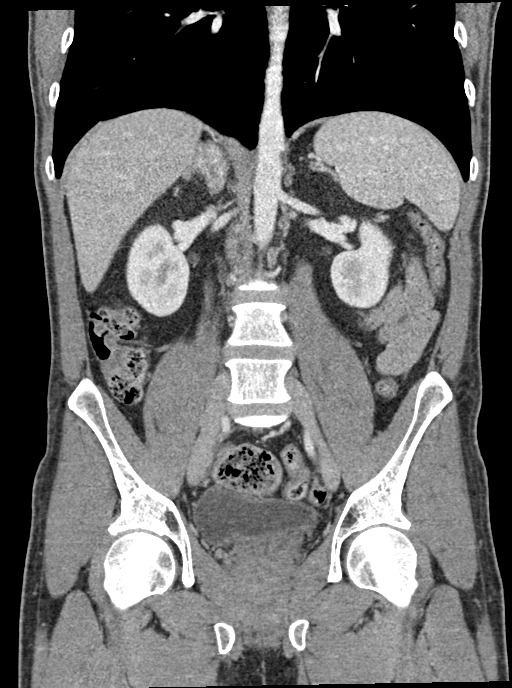

[15 of 46 positions shown; findings below may reference images not displayed]

RADIATION DOSE REDUCTION: This exam was performed according to the
departmental dose-optimization program which includes automated
exposure control, adjustment of the mA and/or kV according to
patient size and/or use of iterative reconstruction technique.

CONTRAST:  100mL OMNIPAQUE IOHEXOL 300 MG/ML  SOLN
FINDINGS: Lower chest: No acute abnormality.

Hepatobiliary: No focal liver abnormality is seen. No gallstones,
gallbladder wall thickening, or biliary dilatation.

Pancreas: Unremarkable. No pancreatic ductal dilatation or
surrounding inflammatory changes.

Spleen: Normal in size without focal abnormality.

Adrenals/Urinary Tract: Adrenal glands are unremarkable. Kidneys are
normal, without renal calculi, focal lesion, or hydronephrosis.
Bladder is unremarkable.

Stomach/Bowel: The stomach is nonenlarged. No dilated small bowel.
The proximal appendix appears within normal limits. There is
dilatation of the distal appendix and tip, coronal series 5, image
68 measuring up to 12 mm. Appendicoliths within the lumen measuring
11 mm. Negative for extraluminal gas.

Vascular/Lymphatic: No significant vascular findings are present.
Mild atherosclerosis. No enlarged abdominal or pelvic lymph nodes.

Reproductive: Prostate is unremarkable.

Other: Negative for pelvic effusion or free air

Musculoskeletal: No acute or significant osseous findings.
IMPRESSION: 1. Findings consistent with acute appendicitis.

Appendix: Location: Right lower quadrant

Diameter: 12 mm

Appendicolith: Positive

Mucosal hyperenhancement: Negative

Extraluminal gas: Negative

Periappendical collection: Negative

## 2023-05-31 ENCOUNTER — Other Ambulatory Visit: Payer: Self-pay | Admitting: Family

## 2023-05-31 ENCOUNTER — Other Ambulatory Visit (HOSPITAL_BASED_OUTPATIENT_CLINIC_OR_DEPARTMENT_OTHER): Payer: Self-pay

## 2023-06-01 ENCOUNTER — Other Ambulatory Visit: Payer: Self-pay

## 2023-06-01 ENCOUNTER — Other Ambulatory Visit (HOSPITAL_BASED_OUTPATIENT_CLINIC_OR_DEPARTMENT_OTHER): Payer: Self-pay

## 2023-06-01 MED ORDER — ALPRAZOLAM 1 MG PO TABS
1.0000 mg | ORAL_TABLET | Freq: Every day | ORAL | 0 refills | Status: DC | PRN
  Filled 2023-06-01: qty 30, 30d supply, fill #0

## 2023-06-02 ENCOUNTER — Other Ambulatory Visit: Payer: Self-pay

## 2023-06-03 ENCOUNTER — Other Ambulatory Visit (HOSPITAL_BASED_OUTPATIENT_CLINIC_OR_DEPARTMENT_OTHER): Payer: Self-pay

## 2023-07-01 ENCOUNTER — Other Ambulatory Visit: Payer: Self-pay

## 2023-07-01 ENCOUNTER — Other Ambulatory Visit (HOSPITAL_BASED_OUTPATIENT_CLINIC_OR_DEPARTMENT_OTHER): Payer: Self-pay

## 2023-07-01 ENCOUNTER — Other Ambulatory Visit: Payer: Self-pay | Admitting: Family

## 2023-07-01 MED ORDER — ALPRAZOLAM 1 MG PO TABS
1.0000 mg | ORAL_TABLET | Freq: Every day | ORAL | 0 refills | Status: DC | PRN
  Filled 2023-07-01: qty 30, 30d supply, fill #0

## 2023-07-04 ENCOUNTER — Other Ambulatory Visit (HOSPITAL_BASED_OUTPATIENT_CLINIC_OR_DEPARTMENT_OTHER): Payer: Self-pay

## 2023-08-04 ENCOUNTER — Other Ambulatory Visit: Payer: Self-pay | Admitting: Family

## 2023-08-05 ENCOUNTER — Other Ambulatory Visit (HOSPITAL_BASED_OUTPATIENT_CLINIC_OR_DEPARTMENT_OTHER): Payer: Self-pay

## 2023-08-05 MED ORDER — ALPRAZOLAM 1 MG PO TABS
1.0000 mg | ORAL_TABLET | Freq: Every day | ORAL | 0 refills | Status: DC | PRN
  Filled 2023-08-05: qty 30, 30d supply, fill #0

## 2023-09-02 ENCOUNTER — Other Ambulatory Visit (HOSPITAL_BASED_OUTPATIENT_CLINIC_OR_DEPARTMENT_OTHER): Payer: Self-pay

## 2023-09-02 ENCOUNTER — Ambulatory Visit (INDEPENDENT_AMBULATORY_CARE_PROVIDER_SITE_OTHER): Payer: 59 | Admitting: Family

## 2023-09-02 VITALS — BP 110/67 | HR 83 | Temp 98.8°F | Resp 16 | Ht 70.0 in | Wt 162.0 lb

## 2023-09-02 DIAGNOSIS — Z0000 Encounter for general adult medical examination without abnormal findings: Secondary | ICD-10-CM | POA: Diagnosis not present

## 2023-09-02 DIAGNOSIS — Z720 Tobacco use: Secondary | ICD-10-CM | POA: Diagnosis not present

## 2023-09-02 DIAGNOSIS — E785 Hyperlipidemia, unspecified: Secondary | ICD-10-CM | POA: Diagnosis not present

## 2023-09-02 DIAGNOSIS — F419 Anxiety disorder, unspecified: Secondary | ICD-10-CM

## 2023-09-02 LAB — LIPID PANEL
Cholesterol: 206 mg/dL — ABNORMAL HIGH (ref 0–200)
HDL: 49.5 mg/dL (ref >39.00)
LDL Cholesterol: 136 mg/dL — ABNORMAL HIGH (ref 0–99)
NonHDL: 156.27
Total CHOL/HDL Ratio: 4
Triglycerides: 101 mg/dL (ref 0.0–149.0)
VLDL: 20.2 mg/dL (ref 0.0–40.0)

## 2023-09-02 LAB — COMPREHENSIVE METABOLIC PANEL
ALT: 32 U/L (ref 0–53)
AST: 20 U/L (ref 0–37)
Albumin: 4.6 g/dL (ref 3.5–5.2)
Alkaline Phosphatase: 56 U/L (ref 39–117)
BUN: 20 mg/dL (ref 6–23)
CO2: 28 meq/L (ref 19–32)
Calcium: 9.3 mg/dL (ref 8.4–10.5)
Chloride: 103 meq/L (ref 96–112)
Creatinine, Ser: 0.83 mg/dL (ref 0.40–1.50)
GFR: 107.75 mL/min (ref >60.00)
Glucose, Bld: 98 mg/dL (ref 70–99)
Potassium: 4.3 meq/L (ref 3.5–5.1)
Sodium: 138 meq/L (ref 135–145)
Total Bilirubin: 0.6 mg/dL (ref 0.2–1.2)
Total Protein: 6.8 g/dL (ref 6.0–8.3)

## 2023-09-02 MED ORDER — PAROXETINE HCL 40 MG PO TABS
40.0000 mg | ORAL_TABLET | Freq: Every morning | ORAL | 1 refills | Status: DC
  Filled 2023-09-02 – 2023-10-01 (×2): qty 90, 90d supply, fill #0
  Filled 2024-01-04: qty 90, 90d supply, fill #1

## 2023-09-02 MED ORDER — ALPRAZOLAM 1 MG PO TABS
1.0000 mg | ORAL_TABLET | Freq: Every day | ORAL | 0 refills | Status: DC | PRN
  Filled 2023-09-02: qty 30, 30d supply, fill #0

## 2023-09-02 MED ORDER — PANTOPRAZOLE SODIUM 40 MG PO TBEC
40.0000 mg | DELAYED_RELEASE_TABLET | Freq: Every day | ORAL | 1 refills | Status: DC
  Filled 2023-09-02 – 2023-10-01 (×2): qty 90, 90d supply, fill #0
  Filled 2024-01-04: qty 90, 90d supply, fill #1

## 2023-09-02 NOTE — Progress Notes (Unsigned)
Subjective:     Patient ID: Brandon Jensen, male    DOB: 23-Oct-1980, 43 y.o.   MRN: [id number]  Chief Complaint  Patient presents with   Annual Exam    HPI  Discussed the use of AI scribe software for clinical note transcription with the patient, who gave verbal consent to proceed.  History of Present Illness          Pt presents for annual physical and routine follow up  He reports good control of his anxiety symptoms on his current regimen of Paxil 40mg  daily and Xanax 0.5mg  as needed. He typically takes half a pill of Xanax daily at night to aid with sleep. He denies any new or worsening symptoms.  Immunizations: declines flu shots or covid booster Diet: very healthy Exercise: exercises regularly- walks a few times a week Colo 2024- due in 2034 Vision: up to date Dental: due     Health Maintenance Due  Topic Date Due   Hepatitis C Screening  Never done    Past Medical History:  Diagnosis Date   Acute appendicitis 01/18/2022   Allergy    Anal fissure 06/29/2022   Anxiety    GERD (gastroesophageal reflux disease)    Hyperlipemia    Obstructive sleep apnea 01/18/2011   Pneumonia    Rhabdomyolysis 01/06/2022   Sleep apnea     Past Surgical History:  Procedure Laterality Date   APPENDECTOMY     COLONOSCOPY     INGUINAL HERNIA REPAIR     LAPAROSCOPIC APPENDECTOMY N/A 01/18/2022   Procedure: APPENDECTOMY LAPAROSCOPIC;  Surgeon: Darnell Level, MD;  Location: [facility];  Service: General;  Laterality: N/A;    Family History  Problem Relation Age of Onset   CAD Father 31       s/p cabg x 3   Anxiety disorder Sister    Asthma Sister    Kidney disease Maternal Grandmother    Colon cancer Maternal Grandfather 88   Bladder Cancer Maternal Grandfather    Skin cancer Maternal Grandfather    Stroke Maternal Grandfather    Hypertension Paternal Grandfather    Stroke Paternal Grandfather    Prostate cancer Paternal Grandfather    Heart disease Paternal  Grandfather    Stomach cancer Neg Hx     Social History   Socioeconomic History   Marital status: Married    Spouse name: Sales promotion account executive   Number of children: 2   Years of education: Not on file   Highest education level: Some college, no degree  Occupational History   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: [employer]   Occupation: Engineer, materials  Tobacco Use   Smoking status: Former    Current packs/day: 0.00    Types: Cigarettes    Quit date: 11/11/2013    Years since quitting: 9.8   Smokeless tobacco: Current    Types: Snuff   Tobacco comments:    Nicotine pouches   Vaping Use   Vaping status: Never Used  Substance and Sexual Activity   Alcohol use: No    Alcohol/week: 0.0 standard drinks of alcohol    Comment: 1-2 drinks a month   Drug use: No   Sexual activity: Yes    Partners: Female  Other Topics Concern   Not on file  Social History Narrative   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   2 children   Married 12/24   Social Drivers of Health  Financial Resource Strain: Low Risk  (09/02/2023)   Overall Financial Resource Strain (CARDIA)    Difficulty of Paying Living Expenses: Not hard at all  Food Insecurity: No Food Insecurity (09/02/2023)   Hunger Vital Sign    Worried About Running Out of Food in the Last Year: Never true    Ran Out of Food in the Last Year: Never true  Transportation Needs: No Transportation Needs (09/02/2023)   PRAPARE - Administrator, Civil Service (Medical): No    Lack of Transportation (Non-Medical): No  Physical Activity: Insufficiently Active (09/02/2023)   Exercise Vital Sign    Days of Exercise per Week: 3 days    Minutes of Exercise per Session: 20 min  Stress: No Stress Concern Present (09/02/2023)   [employer] of Occupational Health - Occupational Stress Questionnaire    Feeling of Stress : Not at all  Social Connections: Socially Isolated (09/02/2023)   Social  Connection and Isolation Panel [NHANES]    Frequency of Communication with Friends and Family: Once a week    Frequency of Social Gatherings with Friends and Family: Once a week    Attends Religious Services: Never    Database administrator or Organizations: No    Attends Engineer, structural: Not on file    Marital Status: Married  Catering manager Violence: Not on file    Outpatient Medications Prior to Visit  Medication Sig Dispense Refill   AMBULATORY NON FORMULARY MEDICATION Medication Name: Diltiazem 2% gel - Using your index finger, apply a small amount of medication inside the rectum up to your first knuckle/joint three times daily x 6-8 weeks. 30 g 1   AMBULATORY NON FORMULARY MEDICATION Medication Name: Nitroglycerin 0.125% gel with Lidocaine 5% - Apply a pea size amount to your rectum to the first knuckle three times daily x 6-8 weeks. 45 g 1   EPINEPHrine 0.3 mg/0.3 mL IJ SOAJ injection Inject 0.3 mg into the muscle as needed for anaphylaxis. 2 each 0   ALPRAZolam (XANAX) 1 MG tablet Take 1 tablet (1 mg total) by mouth daily as needed for anxiety. 30 tablet 0   pantoprazole (PROTONIX) 40 MG tablet Take 1 tablet (40 mg total) by mouth daily. 90 tablet 1   PARoxetine (PAXIL) 40 MG tablet Take 1 tablet (40 mg total) by mouth every morning. 90 tablet 1   No facility-administered medications prior to visit.    Allergies  Allergen Reactions   Beef-Derived Drug Products     Confirmed through allergy testing   Corn-Containing Products     Confirmed through allergy testing   Egg-Derived Products     Confirmed through allergy testing   Lipitor [Atorvastatin] Other (See Comments)    rhabdomyolysis   Milk-Related Compounds     Confirmed through allergy testing   Peanut-Containing Drug Products     Confirmed through allergy testing   Red Dye #40 (Allura Red) Hives    Patient reported not having a reaction, so not sure   Wheat     Confirmed through allergy testing     Review of Systems  Constitutional:  Negative for weight loss.  HENT:  Negative for congestion.   Eyes:  Negative for blurred vision.  Respiratory:  Positive for cough (mild, chronic).   Cardiovascular:  Negative for leg swelling.  Gastrointestinal:  Negative for constipation and diarrhea.  Genitourinary:  Negative for dysuria and frequency.  Musculoskeletal:  Positive for myalgias (muscles feel tight). Negative for joint pain.  Skin:  Negative for rash.  Neurological:  Negative for headaches.  Psychiatric/Behavioral:  Negative for depression.    Wt Readings from Last 3 Encounters:  09/02/23 162 lb (73.5 kg)  02/16/23 161 lb (73 kg)  10/22/22 162 lb 3.2 oz (73.6 kg)       Objective:    Physical Exam   BP 110/67 (BP Location: Right Arm, Patient Position: Sitting, Cuff Size: Normal)   Pulse 83   Temp 98.8 F (37.1 C) (Oral)   Resp 16   Ht 5\' 10"  (1.778 m)   Wt 162 lb (73.5 kg)   SpO2 99%   BMI 23.24 kg/m  Wt Readings from Last 3 Encounters:  09/02/23 162 lb (73.5 kg)  02/16/23 161 lb (73 kg)  10/22/22 162 lb 3.2 oz (73.6 kg)   Physical Exam  Constitutional: He is oriented to person, place, and time. He appears well-developed and well-nourished. No distress.  HENT:  Head: Normocephalic and atraumatic.  Right Ear: Tympanic membrane and ear canal normal.  Left Ear: Tympanic membrane and ear canal normal.  Mouth/Throat: Oropharynx is clear and moist.  Eyes: Pupils are equal, round, and reactive to light. No scleral icterus.  Neck: Normal range of motion. No thyromegaly present.  Cardiovascular: Normal rate and regular rhythm.   No murmur heard. Pulmonary/Chest: Effort normal and breath sounds normal. No respiratory distress. He has no wheezes. He has no rales. He exhibits no tenderness.  Abdominal: Soft. Bowel sounds are normal. He exhibits no distension and no mass. There is no tenderness. There is no rebound and no guarding.  Musculoskeletal: He exhibits no  edema.  Lymphadenopathy:    He has no cervical adenopathy.  Neurological: He is alert and oriented to person, place, and time. He has normal patellar reflexes. He exhibits normal muscle tone. Coordination normal.  Skin: Skin is warm and dry.  Psychiatric: He has a normal mood and affect. His behavior is normal. Judgment and thought content normal.           Assessment & Plan:       Assessment & Plan:   Problem List Items Addressed This Visit       Unprioritized   Nicotine abuse   Counseled pt on cessation of nicotine pouches.       Hyperlipidemia - Primary   Lab Results  Component Value Date   CHOL 206 (H) 09/02/2023   HDL 49.50 09/02/2023   LDLCALC 136 (H) 09/02/2023   LDLDIRECT 67.0 02/01/2018   TRIG 101.0 09/02/2023   CHOLHDL 4 09/02/2023   Mild elevation of cholesterol. Continue healthy diet, exercise and weight loss.      Relevant Orders   Comp Met (CMET) (Completed)   Lipid panel (Completed)   Anxiety   Stable. Continue paxil, prn xanax.  Controlled substance contract is updated today. Obtain UDS.       Relevant Medications   ALPRAZolam (XANAX) 1 MG tablet   PARoxetine (PAXIL) 40 MG tablet   Other Relevant Orders   DRUG MONITORING, PANEL 8 WITH CONFIRMATION, URINE    I am having Marcie Bal "Joe" maintain his AMBULATORY NON FORMULARY MEDICATION, AMBULATORY NON FORMULARY MEDICATION, EPINEPHrine, ALPRAZolam, PARoxetine, and pantoprazole.  Meds ordered this encounter  Medications   ALPRAZolam (XANAX) 1 MG tablet    Sig: Take 1 tablet (1 mg total) by mouth daily as needed for anxiety.    Dispense:  30 tablet    Refill:  0    Supervising Provider:   Abner Greenspan,  STACEY A [[id number]]   PARoxetine (PAXIL) 40 MG tablet    Sig: Take 1 tablet (40 mg total) by mouth every morning.    Dispense:  90 tablet    Refill:  1    Supervising Provider:   Danise Edge A [[id number]]   pantoprazole (PROTONIX) 40 MG tablet    Sig: Take 1 tablet (40 mg total) by mouth daily.     Dispense:  90 tablet    Refill:  1    Supervising Provider:   Danise Edge A [[id number]]

## 2023-09-03 ENCOUNTER — Encounter: Payer: Self-pay | Admitting: Family

## 2023-09-03 NOTE — Assessment & Plan Note (Signed)
He has done well maintaining his weight.  Continue healthy diet, exercise. Declines flu shot/covid booster.  Overdue for dental, has dental insurance. Encouraged pt to schedule dental visit.

## 2023-09-03 NOTE — Assessment & Plan Note (Signed)
Stable. Continue paxil, prn xanax.  Controlled substance contract is updated today. Obtain UDS.

## 2023-09-03 NOTE — Assessment & Plan Note (Signed)
Lab Results  Component Value Date   CHOL 206 (H) 09/02/2023   HDL 49.50 09/02/2023   LDLCALC 136 (H) 09/02/2023   LDLDIRECT 67.0 02/01/2018   TRIG 101.0 09/02/2023   CHOLHDL 4 09/02/2023   Mild elevation of cholesterol. Continue healthy diet, exercise and weight loss.

## 2023-09-03 NOTE — Assessment & Plan Note (Signed)
Counseled pt on cessation of nicotine pouches.

## 2023-09-04 LAB — DRUG MONITORING, PANEL 8 WITH CONFIRMATION, URINE
6 Acetylmorphine: NEGATIVE ng/mL (ref <10)
Alcohol Metabolites: NEGATIVE ng/mL (ref <500)
Alphahydroxyalprazolam: 49 ng/mL — ABNORMAL HIGH (ref <25)
Alphahydroxymidazolam: NEGATIVE ng/mL (ref <50)
Alphahydroxytriazolam: NEGATIVE ng/mL (ref <50)
Aminoclonazepam: NEGATIVE ng/mL (ref <25)
Amphetamines: NEGATIVE ng/mL (ref <500)
Benzodiazepines: POSITIVE ng/mL — AB (ref <100)
Buprenorphine, Urine: NEGATIVE ng/mL (ref <5)
Cocaine Metabolite: NEGATIVE ng/mL (ref <150)
Creatinine: 88.4 mg/dL (ref >=20.0)
Hydroxyethylflurazepam: NEGATIVE ng/mL (ref <50)
Lorazepam: NEGATIVE ng/mL (ref <50)
MDMA: NEGATIVE ng/mL (ref <500)
Marijuana Metabolite: NEGATIVE ng/mL (ref <20)
Nordiazepam: NEGATIVE ng/mL (ref <50)
Opiates: NEGATIVE ng/mL (ref <100)
Oxazepam: NEGATIVE ng/mL (ref <50)
Oxidant: NEGATIVE ug/mL (ref <200)
Oxycodone: NEGATIVE ng/mL (ref <100)
Temazepam: NEGATIVE ng/mL (ref <50)
pH: 7.1 (ref 4.5–9.0)

## 2023-09-04 LAB — DM TEMPLATE

## 2023-10-03 ENCOUNTER — Other Ambulatory Visit (HOSPITAL_BASED_OUTPATIENT_CLINIC_OR_DEPARTMENT_OTHER): Payer: Self-pay

## 2023-11-08 ENCOUNTER — Other Ambulatory Visit: Payer: Self-pay | Admitting: Family

## 2023-11-08 ENCOUNTER — Other Ambulatory Visit (HOSPITAL_BASED_OUTPATIENT_CLINIC_OR_DEPARTMENT_OTHER): Payer: Self-pay

## 2023-11-08 ENCOUNTER — Other Ambulatory Visit: Payer: Self-pay

## 2023-11-08 MED ORDER — ALPRAZOLAM 1 MG PO TABS
1.0000 mg | ORAL_TABLET | Freq: Every day | ORAL | 0 refills | Status: DC | PRN
  Filled 2023-11-08: qty 30, 30d supply, fill #0

## 2023-11-08 NOTE — Telephone Encounter (Signed)
 Requesting: alprazolam 1mg   Contract: 09/02/23 UDS: 09/02/23 Last Visit: 09/02/23 Next Visit: None Last Refill: 09/02/23 #30 and 0RF   Please Advise

## 2023-12-27 ENCOUNTER — Other Ambulatory Visit: Payer: Self-pay | Admitting: Family

## 2023-12-28 ENCOUNTER — Other Ambulatory Visit: Payer: Self-pay

## 2023-12-28 ENCOUNTER — Other Ambulatory Visit (HOSPITAL_BASED_OUTPATIENT_CLINIC_OR_DEPARTMENT_OTHER): Payer: Self-pay

## 2023-12-28 MED ORDER — ALPRAZOLAM 1 MG PO TABS
1.0000 mg | ORAL_TABLET | Freq: Every day | ORAL | 0 refills | Status: DC | PRN
  Filled 2023-12-28: qty 30, 30d supply, fill #0

## 2023-12-28 NOTE — Telephone Encounter (Signed)
 Requesting: alprazolam  1mg   Contract: 09/02/23 UDS: 09/02/23 Last Visit: 09/02/23 Next Visit: None Last Refill: 11/08/23 #30 and 0RF  Please Advise

## 2024-02-11 ENCOUNTER — Other Ambulatory Visit: Payer: Self-pay | Admitting: Family

## 2024-02-12 MED ORDER — ALPRAZOLAM 1 MG PO TABS
1.0000 mg | ORAL_TABLET | Freq: Every day | ORAL | 0 refills | Status: DC | PRN
  Filled 2024-02-12: qty 30, 30d supply, fill #0

## 2024-02-12 MED ORDER — PANTOPRAZOLE SODIUM 40 MG PO TBEC
40.0000 mg | DELAYED_RELEASE_TABLET | Freq: Every day | ORAL | 1 refills | Status: AC
  Filled 2024-02-12 – 2024-03-31 (×2): qty 90, 90d supply, fill #0
  Filled 2024-07-08: qty 90, 90d supply, fill #1

## 2024-02-12 MED ORDER — PAROXETINE HCL 40 MG PO TABS
40.0000 mg | ORAL_TABLET | Freq: Every morning | ORAL | 1 refills | Status: AC
  Filled 2024-02-12 – 2024-04-02 (×3): qty 90, 90d supply, fill #0
  Filled 2024-07-08: qty 90, 90d supply, fill #1

## 2024-02-13 ENCOUNTER — Other Ambulatory Visit (HOSPITAL_BASED_OUTPATIENT_CLINIC_OR_DEPARTMENT_OTHER): Payer: Self-pay

## 2024-03-23 ENCOUNTER — Other Ambulatory Visit (HOSPITAL_BASED_OUTPATIENT_CLINIC_OR_DEPARTMENT_OTHER): Payer: Self-pay

## 2024-03-23 ENCOUNTER — Ambulatory Visit: Admitting: Family

## 2024-03-23 ENCOUNTER — Ambulatory Visit: Payer: Self-pay | Admitting: Family

## 2024-03-23 VITALS — BP 120/74 | HR 76 | Temp 98.7°F | Resp 16 | Ht 70.0 in | Wt 166.4 lb

## 2024-03-23 DIAGNOSIS — F419 Anxiety disorder, unspecified: Secondary | ICD-10-CM | POA: Diagnosis not present

## 2024-03-23 DIAGNOSIS — M791 Myalgia, unspecified site: Secondary | ICD-10-CM | POA: Diagnosis not present

## 2024-03-23 DIAGNOSIS — G4733 Obstructive sleep apnea (adult) (pediatric): Secondary | ICD-10-CM | POA: Diagnosis not present

## 2024-03-23 LAB — BASIC METABOLIC PANEL WITH GFR
BUN: 23 mg/dL (ref 6–23)
CO2: 30 meq/L (ref 19–32)
Calcium: 9.4 mg/dL (ref 8.4–10.5)
Chloride: 101 meq/L (ref 96–112)
Creatinine, Ser: 0.87 mg/dL (ref 0.40–1.50)
GFR: 105.82 mL/min (ref >60.00)
Glucose, Bld: 91 mg/dL (ref 70–99)
Potassium: 4.6 meq/L (ref 3.5–5.1)
Sodium: 140 meq/L (ref 135–145)

## 2024-03-23 LAB — CK: Total CK: 137 U/L (ref 17–232)

## 2024-03-23 MED ORDER — ALPRAZOLAM 1 MG PO TABS
1.0000 mg | ORAL_TABLET | Freq: Every day | ORAL | 0 refills | Status: DC | PRN
  Filled 2024-03-23: qty 30, 30d supply, fill #0

## 2024-03-23 NOTE — Assessment & Plan Note (Addendum)
 well-managed on current medications, Paxil  and Xanax . He prefers to maintain current regimen due to other health priorities. - Refill Xanax  prescription. -Controlled substance up to date.

## 2024-03-23 NOTE — Progress Notes (Signed)
 Subjective:     Patient ID: Brandon Jensen, male    DOB: 21-Nov-1980, 43 y.o.   MRN: [id number]  Chief Complaint  Patient presents with   Anxiety    Doing well on medication   Muscle Thightness    Patient complains of muscles feeling tight, having to stretch often     Anxiety      Discussed the use of AI scribe software for clinical note transcription with the patient, who gave verbal consent to proceed.  History of Present Illness  Brandon Jensen is a 43 year old male with a history of rhabdomyolysis who presents with muscle tightness and sleep apnea.  He experiences constant and severe muscle stiffness and tightness in his thighs and gluteal area, which interferes with sleep and daily activities. Stretching before sleep provides temporary relief. Monthly massages offer short-term relief, but tightness returns quickly.  He has sleep apnea and previously used a CPAP machine, which he is not currently using. He snores and feels frequently tired. He was overweight during his last sleep study.  He limits physical activity to walking, avoiding strenuous exercise due to fear of rhabdomyolysis recurrence. He has not engaged in a workout routine since his hospital stay.  He takes Paxil  and Xanax  for anxiety and depression, with no changes in medication. He has considered discontinuing Paxil  but has not attempted it. He manages anxiety and depression with these medications.  He is very flexible with no issues in range of motion in his legs, but experiences constant muscle tightness and soreness.    Health Maintenance Due  Topic Date Due   INFLUENZA VACCINE  03/16/2024    Past Medical History:  Diagnosis Date   Acute appendicitis 01/18/2022   Allergy     Anal fissure 06/29/2022   Anxiety    GERD (gastroesophageal reflux disease)    Hyperlipemia    Obstructive sleep apnea 01/18/2011   Pneumonia    Rhabdomyolysis 01/06/2022   Sleep apnea     Past Surgical  History:  Procedure Laterality Date   APPENDECTOMY     COLONOSCOPY     INGUINAL HERNIA REPAIR     LAPAROSCOPIC APPENDECTOMY N/A 01/18/2022   Procedure: APPENDECTOMY LAPAROSCOPIC;  Surgeon: Eletha Boas, MD;  Location: [facility];  Service: General;  Laterality: N/A;    Family History  Problem Relation Age of Onset   CAD Father 52       s/p cabg x 3   Anxiety disorder Sister    Asthma Sister    Kidney disease Maternal Grandmother    Colon cancer Maternal Grandfather 88   Bladder Cancer Maternal Grandfather    Skin cancer Maternal Grandfather    Stroke Maternal Grandfather    Hypertension Paternal Grandfather    Stroke Paternal Grandfather    Prostate cancer Paternal Grandfather    Heart disease Paternal Grandfather    Stomach cancer Neg Hx     Social History   Socioeconomic History   Marital status: Married    Spouse name: Sales promotion account executive   Number of children: 2   Years of education: Not on file   Highest education level: Some college, no degree  Occupational History   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: [employer]   Occupation: Engineer, materials  Tobacco Use   Smoking status: Former    Current packs/day: 0.00    Types: Cigarettes    Quit date: 11/11/2013    Years since quitting: 10.3   Smokeless tobacco: Current  Types: Snuff   Tobacco comments:    Nicotine pouches   Vaping Use   Vaping status: Never Used  Substance and Sexual Activity   Alcohol use: No    Alcohol/week: 0.0 standard drinks of alcohol    Comment: 1-2 drinks a month   Drug use: No   Sexual activity: Yes    Partners: Female  Other Topics Concern   Not on file  Social History Narrative   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   2 children   Married 12/24   Social Drivers of Health   Financial Resource Strain: Low Risk  (03/22/2024)   Overall Financial Resource Strain (CARDIA)    Difficulty of Paying Living Expenses: Not hard at all  Food  Insecurity: No Food Insecurity (03/22/2024)   Hunger Vital Sign    Worried About Running Out of Food in the Last Year: Never true    Ran Out of Food in the Last Year: Never true  Transportation Needs: No Transportation Needs (03/22/2024)   PRAPARE - Administrator, Civil Service (Medical): No    Lack of Transportation (Non-Medical): No  Physical Activity: Insufficiently Active (03/22/2024)   Exercise Vital Sign    Days of Exercise per Week: 3 days    Minutes of Exercise per Session: 20 min  Stress: Stress Concern Present (03/22/2024)   [employer] of Occupational Health - Occupational Stress Questionnaire    Feeling of Stress: To some extent  Social Connections: Socially Isolated (03/22/2024)   Social Connection and Isolation Panel    Frequency of Communication with Friends and Family: Never    Frequency of Social Gatherings with Friends and Family: Once a week    Attends Religious Services: Never    Database administrator or Organizations: No    Attends Engineer, structural: Not on file    Marital Status: Married  Catering manager Violence: Not on file    Outpatient Medications Prior to Visit  Medication Sig Dispense Refill   EPINEPHrine  0.3 mg/0.3 mL IJ SOAJ injection Inject 0.3 mg into the muscle as needed for anaphylaxis. 2 each 0   pantoprazole  (PROTONIX ) 40 MG tablet Take 1 tablet (40 mg total) by mouth daily. 90 tablet 1   PARoxetine  (PAXIL ) 40 MG tablet Take 1 tablet (40 mg total) by mouth every morning. 90 tablet 1   ALPRAZolam  (XANAX ) 1 MG tablet Take 1 tablet (1 mg total) by mouth daily as needed for anxiety. 30 tablet 0   AMBULATORY NON FORMULARY MEDICATION Medication Name: Diltiazem 2% gel - Using your index finger, apply a small amount of medication inside the rectum up to your first knuckle/joint three times daily x 6-8 weeks. 30 g 1   AMBULATORY NON FORMULARY MEDICATION Medication Name: Nitroglycerin 0.125% gel with Lidocaine  5% - Apply a pea size  amount to your rectum to the first knuckle three times daily x 6-8 weeks. 45 g 1   No facility-administered medications prior to visit.    Allergies  Allergen Reactions   Beef-Derived Drug Products     Confirmed through allergy  testing   Corn-Containing Products     Confirmed through allergy  testing   Egg-Derived Products     Confirmed through allergy  testing   Lipitor [Atorvastatin ] Other (See Comments)    rhabdomyolysis   Milk-Related Compounds     Confirmed through allergy  testing   Peanut-Containing Drug Products     Confirmed through allergy  testing  Red Dye #40 (Allura Red) Hives    Patient reported not having a reaction, so not sure   Wheat     Confirmed through allergy  testing    ROS     Objective:    Physical Exam Constitutional:      General: He is not in acute distress.    Appearance: He is well-developed.  HENT:     Head: Normocephalic and atraumatic.  Cardiovascular:     Rate and Rhythm: Normal rate and regular rhythm.     Heart sounds: No murmur heard. Pulmonary:     Effort: Pulmonary effort is normal. No respiratory distress.     Breath sounds: Normal breath sounds. No wheezing or rales.  Skin:    General: Skin is warm and dry.  Neurological:     Mental Status: He is alert and oriented to person, place, and time.  Psychiatric:        Behavior: Behavior normal.        Thought Content: Thought content normal.      BP 120/74 (BP Location: Right Arm, Patient Position: Sitting, Cuff Size: Normal)   Pulse 76   Temp 98.7 F (37.1 C) (Oral)   Resp 16   Ht 5' 10 (1.778 m)   Wt 166 lb 6.4 oz (75.5 kg)   SpO2 100%   BMI 23.88 kg/m  Wt Readings from Last 3 Encounters:  03/23/24 166 lb 6.4 oz (75.5 kg)  09/02/23 162 lb (73.5 kg)  02/16/23 161 lb (73 kg)       Assessment & Plan:   Problem List Items Addressed This Visit       Unprioritized   Obstructive sleep apnea - Primary    Obstructive sleep apnea with previous CPAP use. Has lost 40  pounds since last sleep study. Currently not using CPAP and experiencing symptoms of snoring and fatigue. - Refer to [facility] in [place] for CPAP evaluation and management.       Relevant Orders   Ambulatory referral to Pulmonology   Myalgia   Muscle tightness and stiffness in thighs and gluteal area with history of rhabdomyolysis Chronic muscle tightness and stiffness with history of rhabdomyolysis raises concern for muscle degradation or elevated CK levels. Symptoms include constant tightness, requiring stretching before sleep, and temporary relief from massages. He is apprehensive about engaging in a workout routine due to past rhabdomyolysis experience. - Order CK level to assess for muscle enzyme elevation. - Refer to sports medicine for evaluation and recommendations on stretching and muscle management. - Offered prescription for muscle relaxer as needed, pt declined.       Relevant Orders   CK (Creatine Kinase)   Ambulatory referral to Sports Medicine   Basic Metabolic Panel (BMET)   Anxiety   well-managed on current medications, Paxil  and Xanax . He prefers to maintain current regimen due to other health priorities. - Refill Xanax  prescription. -Controlled substance up to date.      Relevant Medications   ALPRAZolam  (XANAX ) 1 MG tablet    General Health Maintenance He declined hepatitis C screening, hepatitis B vaccine, and HPV vaccine at this time. I have discontinued Fairy DONEEN Louder Joe's AMBULATORY NON FORMULARY MEDICATION and AMBULATORY NON FORMULARY MEDICATION. I am also having him maintain his EPINEPHrine , PARoxetine , pantoprazole , and ALPRAZolam .  Meds ordered this encounter  Medications   ALPRAZolam  (XANAX ) 1 MG tablet    Sig: Take 1 tablet (1 mg total) by mouth daily as needed for anxiety.    Dispense:  30 tablet    Refill:  0    Supervising Provider:   DOMENICA BLACKBIRD A [[id number]]

## 2024-03-23 NOTE — Assessment & Plan Note (Signed)
  Obstructive sleep apnea with previous CPAP use. Has lost 40 pounds since last sleep study. Currently not using CPAP and experiencing symptoms of snoring and fatigue. - Refer to [facility] in [place] for CPAP evaluation and management.

## 2024-03-23 NOTE — Patient Instructions (Signed)
 VISIT SUMMARY:  Today, we addressed your muscle tightness and stiffness, sleep apnea, and mental health management. We discussed your concerns and created a plan to help manage your symptoms and improve your overall well-being.  YOUR PLAN:  MUSCLE TIGHTNESS AND STIFFNESS: You have chronic muscle tightness and stiffness in your thighs and gluteal area, which may be related to your history of rhabdomyolysis. -We will check your CK levels to see if there is any muscle enzyme elevation. -You will be referred to a sports medicine specialist for further evaluation and recommendations on stretching and muscle management.  OBSTRUCTIVE SLEEP APNEA: You have sleep apnea and are not currently using your CPAP machine, which is causing snoring and fatigue. -You will be referred to [facility] in [facility] for a CPAP evaluation and management.  GENERALIZED ANXIETY DISORDER: Your  anxiety is well-managed with your current medications, Paxil  and Xanax . -We will refill your Xanax  prescription.  GENERAL HEALTH MAINTENANCE: We discussed general health maintenance options. -You declined hepatitis C screening, hepatitis B vaccine, and HPV vaccine at this time.

## 2024-03-23 NOTE — Assessment & Plan Note (Signed)
 Muscle tightness and stiffness in thighs and gluteal area with history of rhabdomyolysis Chronic muscle tightness and stiffness with history of rhabdomyolysis raises concern for muscle degradation or elevated CK levels. Symptoms include constant tightness, requiring stretching before sleep, and temporary relief from massages. He is apprehensive about engaging in a workout routine due to past rhabdomyolysis experience. - Order CK level to assess for muscle enzyme elevation. - Refer to sports medicine for evaluation and recommendations on stretching and muscle management. - Offered prescription for muscle relaxer as needed, pt declined.

## 2024-04-02 ENCOUNTER — Other Ambulatory Visit (HOSPITAL_BASED_OUTPATIENT_CLINIC_OR_DEPARTMENT_OTHER): Payer: Self-pay

## 2024-04-30 ENCOUNTER — Other Ambulatory Visit: Payer: Self-pay | Admitting: Family

## 2024-04-30 ENCOUNTER — Other Ambulatory Visit (HOSPITAL_BASED_OUTPATIENT_CLINIC_OR_DEPARTMENT_OTHER): Payer: Self-pay

## 2024-04-30 DIAGNOSIS — F419 Anxiety disorder, unspecified: Secondary | ICD-10-CM

## 2024-04-30 MED ORDER — ALPRAZOLAM 1 MG PO TABS
1.0000 mg | ORAL_TABLET | Freq: Every day | ORAL | 0 refills | Status: DC | PRN
  Filled 2024-04-30: qty 30, 30d supply, fill #0

## 2024-04-30 NOTE — Telephone Encounter (Signed)
 Requesting: alprazolam  1mg   Contract:09/02/23 UDS:09/02/23 Last Visit: 03/23/24 Next Visit: None Last Refill: 03/23/24 #30 and 0RF   Please Advise

## 2024-05-04 ENCOUNTER — Ambulatory Visit: Admitting: Family Medicine

## 2024-05-28 ENCOUNTER — Other Ambulatory Visit (HOSPITAL_BASED_OUTPATIENT_CLINIC_OR_DEPARTMENT_OTHER): Payer: Self-pay

## 2024-05-28 ENCOUNTER — Ambulatory Visit

## 2024-05-28 VITALS — BP 118/76 | HR 81 | Temp 97.8°F | Ht 70.0 in | Wt 173.2 lb

## 2024-05-28 DIAGNOSIS — G4752 REM sleep behavior disorder: Secondary | ICD-10-CM | POA: Diagnosis not present

## 2024-05-28 DIAGNOSIS — G4719 Other hypersomnia: Secondary | ICD-10-CM

## 2024-05-28 DIAGNOSIS — G4726 Circadian rhythm sleep disorder, shift work type: Secondary | ICD-10-CM

## 2024-05-28 DIAGNOSIS — G4733 Obstructive sleep apnea (adult) (pediatric): Secondary | ICD-10-CM | POA: Diagnosis not present

## 2024-05-28 DIAGNOSIS — J45909 Unspecified asthma, uncomplicated: Secondary | ICD-10-CM | POA: Diagnosis not present

## 2024-05-28 DIAGNOSIS — G4753 Recurrent isolated sleep paralysis: Secondary | ICD-10-CM

## 2024-05-28 MED ORDER — MELATONIN 10 MG PO TABS
5.0000 mg | ORAL_TABLET | Freq: Every day | ORAL | 0 refills | Status: DC
  Filled 2024-05-28 (×2): qty 30, fill #0
  Filled 2024-06-01: qty 30, 30d supply, fill #0

## 2024-05-28 NOTE — Patient Instructions (Addendum)
 Notification of test results are managed in the following manner: If there are any recommendations or changes to the plan of care discussed in office today, we will contact you and let you know what they are. If you do not hear from us , then your results are normal/expected and you can view them through your MyChart account, or a letter will be sent to you. Thank you again for trusting us  with your care  Pulmonary.  Keep on lookout for parkinsonism symptoms as discussed.
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
## 2024-06-01 ENCOUNTER — Other Ambulatory Visit: Payer: Self-pay | Admitting: Family

## 2024-06-01 ENCOUNTER — Other Ambulatory Visit: Payer: Self-pay

## 2024-06-01 ENCOUNTER — Other Ambulatory Visit (HOSPITAL_BASED_OUTPATIENT_CLINIC_OR_DEPARTMENT_OTHER): Payer: Self-pay

## 2024-06-01 DIAGNOSIS — F419 Anxiety disorder, unspecified: Secondary | ICD-10-CM

## 2024-06-01 MED ORDER — ALPRAZOLAM 1 MG PO TABS
1.0000 mg | ORAL_TABLET | Freq: Every day | ORAL | 0 refills | Status: DC | PRN
  Filled 2024-06-01: qty 30, 30d supply, fill #0

## 2024-06-01 NOTE — Telephone Encounter (Signed)
 Requesting: alprazolam  1mg  Contract: 09/02/23 UDS: 09/02/23 Last Visit: 03/23/24 Next Visit: None Last Refill: 04/30/24 #30 and 0RF  Please Advise

## 2024-06-06 ENCOUNTER — Encounter

## 2024-06-06 DIAGNOSIS — G4733 Obstructive sleep apnea (adult) (pediatric): Secondary | ICD-10-CM

## 2024-06-08 ENCOUNTER — Ambulatory Visit: Admitting: Sports Medicine

## 2024-06-26 DIAGNOSIS — R069 Unspecified abnormalities of breathing: Secondary | ICD-10-CM | POA: Diagnosis not present

## 2024-06-28 ENCOUNTER — Telehealth: Payer: Self-pay

## 2024-06-28 DIAGNOSIS — G4733 Obstructive sleep apnea (adult) (pediatric): Secondary | ICD-10-CM

## 2024-06-28 NOTE — Telephone Encounter (Signed)
 Date of Study: 06/06/24  Interpretation: Moderate OSA with AHI of 25.2 (20% central/mixed), O2 desaturation 4 min. O2 nadir 86%.   Plan: CPAP titration study has been ordered because of significant amount of central events on sleep study which is predictive of CPAP intolerance.   Sammi Fredericks, MD.

## 2024-06-29 NOTE — Telephone Encounter (Signed)
 ATCx1, unable to LVM as vm box was full.

## 2024-07-02 NOTE — Telephone Encounter (Signed)
 ATCx2 LVMTCB. Sending MyChart message per protocol, as pt has been unable to be reached. NFN.

## 2024-07-02 NOTE — Telephone Encounter (Signed)
See encounter dated 11/13

## 2024-07-04 ENCOUNTER — Other Ambulatory Visit: Payer: Self-pay

## 2024-07-04 DIAGNOSIS — G4733 Obstructive sleep apnea (adult) (pediatric): Secondary | ICD-10-CM

## 2024-07-08 ENCOUNTER — Other Ambulatory Visit: Payer: Self-pay | Admitting: Family

## 2024-07-08 ENCOUNTER — Other Ambulatory Visit: Payer: Self-pay

## 2024-07-08 DIAGNOSIS — F419 Anxiety disorder, unspecified: Secondary | ICD-10-CM

## 2024-07-09 ENCOUNTER — Other Ambulatory Visit (HOSPITAL_BASED_OUTPATIENT_CLINIC_OR_DEPARTMENT_OTHER): Payer: Self-pay

## 2024-07-09 ENCOUNTER — Other Ambulatory Visit: Payer: Self-pay

## 2024-07-09 MED ORDER — ALPRAZOLAM 1 MG PO TABS
1.0000 mg | ORAL_TABLET | Freq: Every day | ORAL | 0 refills | Status: AC | PRN
  Filled 2024-07-09: qty 30, 30d supply, fill #0

## 2024-07-09 MED ORDER — MELATONIN 10 MG PO TABS
5.0000 mg | ORAL_TABLET | Freq: Every day | ORAL | 0 refills | Status: DC
  Filled 2024-07-09: qty 30, 30d supply, fill #0

## 2024-07-09 NOTE — Telephone Encounter (Signed)
 Requesting: alprazolam  1mg   Contract: 09/02/23 UDS: 09/02/23 Last Visit: 03/23/24 Next Visit: None Last Refill: 06/01/24 #30 and 0RF   Please Advise

## 2024-07-11 NOTE — Telephone Encounter (Signed)
 Patient CPAP was received by [facility] on 11/20. Patient needs to contact [facility] if they haven't received a call yet. [phone]. Patient's CPAP titration has been canceled.

## 2024-07-20 ENCOUNTER — Ambulatory Visit

## 2024-07-22 MED ORDER — EPINEPHRINE 0.3 MG/0.3ML IJ SOAJ
0.3000 mg | INTRAMUSCULAR | 0 refills | Status: AC | PRN
  Filled 2024-07-22: qty 2, 1d supply, fill #0

## 2024-07-23 ENCOUNTER — Other Ambulatory Visit (HOSPITAL_BASED_OUTPATIENT_CLINIC_OR_DEPARTMENT_OTHER): Payer: Self-pay

## 2024-08-13 ENCOUNTER — Ambulatory Visit

## 2024-08-13 ENCOUNTER — Other Ambulatory Visit (HOSPITAL_BASED_OUTPATIENT_CLINIC_OR_DEPARTMENT_OTHER): Payer: Self-pay

## 2024-08-13 VITALS — BP 120/88 | Ht 70.0 in | Wt 173.0 lb

## 2024-08-13 DIAGNOSIS — Z8739 Personal history of other diseases of the musculoskeletal system and connective tissue: Secondary | ICD-10-CM | POA: Diagnosis not present

## 2024-08-13 DIAGNOSIS — M6289 Other specified disorders of muscle: Secondary | ICD-10-CM

## 2024-08-13 DIAGNOSIS — M791 Myalgia, unspecified site: Secondary | ICD-10-CM | POA: Diagnosis not present

## 2024-08-13 MED ORDER — METHOCARBAMOL 500 MG PO TABS
500.0000 mg | ORAL_TABLET | Freq: Three times a day (TID) | ORAL | 1 refills | Status: AC
  Filled 2024-08-13: qty 42, 14d supply, fill #0
  Filled 2024-08-23 – 2024-08-24 (×2): qty 42, 14d supply, fill #1

## 2024-08-13 NOTE — Progress Notes (Signed)
" ° °  Subjective:    Patient ID: Brandon Jensen, male    DOB: 43 y.o., Jul 15, 1981   MRN: [id number]  Chief Complaint: myalgia  Discussed the use of AI scribe software for clinical note transcription with the patient, who gave verbal consent to proceed.  History of Present Illness [name] is a 43 year old male with past medical history significant for OSA on CPAP, anxiety on Paxil  and Xanax  presenting with concern for myalgia.  He carries a prior history of rhabdomyolysis and was previously evaluated by his primary care physician, Eleanor Ponto in August.  At that time he is complaining of constant tightness and need for stretching.  He is anxious about potentially initiating an exercise regimen due to prior history of rhabdomyolysis.    CK level obtained at that time (03/23/24) was 137 Creatinine 0.87.  (03/23/2024) Last TSH (01/28/2017) 1.89  Today he reports tightness mostly in anterior thighs and glutes No tightness sensation in hamstrings or posterior thighs No tightness sensation in calves. Feels like he has to do stretching routine prior to sleeping and also several times throughout the day No medicines tried for this Has avoided organized exercise out of fear of recurrence of rhabdo. No fasciculations or spasm in muscles Never had this previously No prior history of rhabdo before episode 2 years ago No numbness or tingling associated with tightness sensation   Review of Pertinent Imaging: N/a    Objective:   Vitals:   08/13/24 1023  BP: 120/88    Const: appears well, non-toxic, well groomed Psych: affect bright, interactive, smiling EENT: EOMI intact, conjunctiva appear normal Neck: no obvious masses, appears symmetric Resp: non-labored, appears symmetric Neuro: muscle bulk appears normal Skin: no obvious rashes noted  He is nontender to deep palpation of his anterior and posterior thigh musculature bilaterally.  Nontender gluteal musculature.  5 out of 5 strength  testing with quadriceps, hamstrings, gluteus medius/minimus.  2+ patellar and Achilles reflexes bilaterally.     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  I certainly suspect Kinesiophobia resultant from his case of rhabdomyolysis has contributed.  There may be some myofascial component to his pain as well potentially related to acute decrease in activity level.  Renal function and electrolytes normal when obtained in August and no persistently elevated CK is encouraging. No exaggerated reflexes to suggest spasticity from CNS lesion, etc. Doubt metabolic myopathy, but can consider this workup (acylcarnitine profile, repeat TSH, genetic profile, muscle biopsy, etc) if symptoms persist or rhabdo recurrence during his exercise progression.  Will obtain TSH today, initiate treatment with methocarbamol  500 mg as needed, and start return to activity with formal return to play training program as outlined in [employer], 2016 study in Journal of Athletic Training. Provided pt with handout detailing the salient phases for him to engage in in the coming days and will refer to PT for guidance here as well. Follow up in 2-3 weeks.   "

## 2024-08-14 ENCOUNTER — Ambulatory Visit: Payer: Self-pay

## 2024-08-14 LAB — TSH: TSH: 2.63 u[IU]/mL (ref 0.450–4.500)

## 2024-08-23 ENCOUNTER — Other Ambulatory Visit (HOSPITAL_BASED_OUTPATIENT_CLINIC_OR_DEPARTMENT_OTHER): Payer: Self-pay

## 2024-08-23 ENCOUNTER — Other Ambulatory Visit: Payer: Self-pay

## 2024-08-23 ENCOUNTER — Other Ambulatory Visit: Payer: Self-pay | Admitting: Family

## 2024-08-23 DIAGNOSIS — F419 Anxiety disorder, unspecified: Secondary | ICD-10-CM

## 2024-08-23 MED ORDER — MELATONIN 10 MG PO TABS
5.0000 mg | ORAL_TABLET | Freq: Every day | ORAL | 0 refills | Status: AC
  Filled 2024-08-23: qty 120, 120d supply, fill #0

## 2024-08-24 ENCOUNTER — Other Ambulatory Visit (HOSPITAL_BASED_OUTPATIENT_CLINIC_OR_DEPARTMENT_OTHER): Payer: Self-pay

## 2024-08-24 ENCOUNTER — Other Ambulatory Visit: Payer: Self-pay

## 2024-09-07 ENCOUNTER — Other Ambulatory Visit (HOSPITAL_BASED_OUTPATIENT_CLINIC_OR_DEPARTMENT_OTHER): Payer: Self-pay

## 2024-09-09 ENCOUNTER — Ambulatory Visit (HOSPITAL_BASED_OUTPATIENT_CLINIC_OR_DEPARTMENT_OTHER): Admitting: Pulmonary Disease

## 2024-09-10 ENCOUNTER — Encounter (HOSPITAL_BASED_OUTPATIENT_CLINIC_OR_DEPARTMENT_OTHER): Payer: Self-pay

## 2024-09-10 ENCOUNTER — Other Ambulatory Visit (HOSPITAL_BASED_OUTPATIENT_CLINIC_OR_DEPARTMENT_OTHER): Payer: Self-pay
